# Patient Record
Sex: Male | Born: 1940 | Race: White | Hispanic: No | Marital: Married | Smoking: Never smoker
Health system: Southern US, Community
[De-identification: ages and names within clinical notes are randomized; demographics above are authoritative.]

## PROBLEM LIST (undated history)

## (undated) DIAGNOSIS — G4733 Obstructive sleep apnea (adult) (pediatric): Secondary | ICD-10-CM

## (undated) DIAGNOSIS — K5790 Diverticulosis of intestine, part unspecified, without perforation or abscess without bleeding: Secondary | ICD-10-CM

## (undated) DIAGNOSIS — E119 Type 2 diabetes mellitus without complications: Secondary | ICD-10-CM

## (undated) DIAGNOSIS — K219 Gastro-esophageal reflux disease without esophagitis: Secondary | ICD-10-CM

## (undated) DIAGNOSIS — E669 Obesity, unspecified: Secondary | ICD-10-CM

## (undated) DIAGNOSIS — F3289 Other specified depressive episodes: Secondary | ICD-10-CM

## (undated) DIAGNOSIS — I2510 Atherosclerotic heart disease of native coronary artery without angina pectoris: Secondary | ICD-10-CM

## (undated) DIAGNOSIS — N400 Enlarged prostate without lower urinary tract symptoms: Secondary | ICD-10-CM

## (undated) DIAGNOSIS — J9690 Respiratory failure, unspecified, unspecified whether with hypoxia or hypercapnia: Secondary | ICD-10-CM

## (undated) DIAGNOSIS — J449 Chronic obstructive pulmonary disease, unspecified: Secondary | ICD-10-CM

## (undated) DIAGNOSIS — J90 Pleural effusion, not elsewhere classified: Secondary | ICD-10-CM

## (undated) DIAGNOSIS — I10 Essential (primary) hypertension: Secondary | ICD-10-CM

## (undated) DIAGNOSIS — I509 Heart failure, unspecified: Secondary | ICD-10-CM

## (undated) DIAGNOSIS — Z9289 Personal history of other medical treatment: Secondary | ICD-10-CM

## (undated) DIAGNOSIS — I2781 Cor pulmonale (chronic): Secondary | ICD-10-CM

## (undated) DIAGNOSIS — I482 Chronic atrial fibrillation, unspecified: Secondary | ICD-10-CM

## (undated) DIAGNOSIS — I272 Pulmonary hypertension, unspecified: Secondary | ICD-10-CM

## (undated) DIAGNOSIS — F329 Major depressive disorder, single episode, unspecified: Secondary | ICD-10-CM

## (undated) HISTORY — DX: Chronic obstructive pulmonary disease, unspecified: J44.9

## (undated) HISTORY — DX: Obesity, unspecified: E66.9

## (undated) HISTORY — PX: TONSILLECTOMY: SUR1361

## (undated) HISTORY — DX: Essential (primary) hypertension: I10

## (undated) HISTORY — DX: Cor pulmonale (chronic): I27.81

## (undated) HISTORY — PX: TURP VAPORIZATION: SUR1397

## (undated) HISTORY — DX: Pulmonary hypertension, unspecified: I27.20

## (undated) HISTORY — DX: Diverticulosis of intestine, part unspecified, without perforation or abscess without bleeding: K57.90

## (undated) HISTORY — DX: Atherosclerotic heart disease of native coronary artery without angina pectoris: I25.10

## (undated) HISTORY — DX: Respiratory failure, unspecified, unspecified whether with hypoxia or hypercapnia: J96.90

## (undated) HISTORY — DX: Gastro-esophageal reflux disease without esophagitis: K21.9

## (undated) HISTORY — DX: Other specified depressive episodes: F32.89

## (undated) HISTORY — DX: Obstructive sleep apnea (adult) (pediatric): G47.33

## (undated) HISTORY — PX: INGUINAL HERNIA REPAIR: SUR1180

## (undated) HISTORY — PX: CHOLECYSTECTOMY: SHX55

## (undated) HISTORY — DX: Major depressive disorder, single episode, unspecified: F32.9

## (undated) HISTORY — DX: Type 2 diabetes mellitus without complications: E11.9

## (undated) HISTORY — DX: Personal history of other medical treatment: Z92.89

## (undated) HISTORY — DX: Benign prostatic hyperplasia without lower urinary tract symptoms: N40.0

## (undated) HISTORY — DX: Pleural effusion, not elsewhere classified: J90

---

## 2001-02-18 ENCOUNTER — Ambulatory Visit (HOSPITAL_COMMUNITY): Admission: RE | Admit: 2001-02-18 | Discharge: 2001-02-18 | Payer: Self-pay | Admitting: Neurosurgery

## 2006-06-04 ENCOUNTER — Ambulatory Visit (HOSPITAL_COMMUNITY): Admission: RE | Admit: 2006-06-04 | Discharge: 2006-06-04 | Payer: Self-pay | Admitting: *Deleted

## 2006-06-18 ENCOUNTER — Encounter (HOSPITAL_COMMUNITY): Admission: RE | Admit: 2006-06-18 | Discharge: 2006-07-18 | Payer: Self-pay | Admitting: Psychiatry

## 2006-06-18 DIAGNOSIS — Z9289 Personal history of other medical treatment: Secondary | ICD-10-CM

## 2006-06-18 HISTORY — DX: Personal history of other medical treatment: Z92.89

## 2006-06-22 ENCOUNTER — Ambulatory Visit (HOSPITAL_COMMUNITY): Admission: RE | Admit: 2006-06-22 | Discharge: 2006-06-22 | Payer: Self-pay | Admitting: *Deleted

## 2006-07-01 ENCOUNTER — Ambulatory Visit (HOSPITAL_COMMUNITY): Admission: RE | Admit: 2006-07-01 | Discharge: 2006-07-01 | Payer: Self-pay | Admitting: *Deleted

## 2006-08-06 ENCOUNTER — Ambulatory Visit: Payer: Self-pay | Admitting: Pulmonary Disease

## 2006-08-06 ENCOUNTER — Inpatient Hospital Stay (HOSPITAL_COMMUNITY): Admission: EM | Admit: 2006-08-06 | Discharge: 2006-08-30 | Payer: Self-pay | Admitting: Emergency Medicine

## 2006-08-25 HISTORY — PX: CARDIAC CATHETERIZATION: SHX172

## 2006-09-23 ENCOUNTER — Ambulatory Visit: Payer: Self-pay | Admitting: Pulmonary Disease

## 2006-10-04 ENCOUNTER — Ambulatory Visit: Payer: Self-pay | Admitting: Pulmonary Disease

## 2006-10-04 ENCOUNTER — Ambulatory Visit: Admission: RE | Admit: 2006-10-04 | Discharge: 2006-10-04 | Payer: Self-pay | Admitting: Pulmonary Disease

## 2006-10-23 ENCOUNTER — Ambulatory Visit: Payer: Self-pay | Admitting: Pulmonary Disease

## 2006-10-30 ENCOUNTER — Ambulatory Visit: Payer: Self-pay | Admitting: Internal Medicine

## 2006-10-30 ENCOUNTER — Inpatient Hospital Stay (HOSPITAL_COMMUNITY): Admission: RE | Admit: 2006-10-30 | Discharge: 2006-11-05 | Payer: Self-pay | Admitting: Urology

## 2006-10-30 ENCOUNTER — Encounter (INDEPENDENT_AMBULATORY_CARE_PROVIDER_SITE_OTHER): Payer: Self-pay | Admitting: Urology

## 2006-11-17 ENCOUNTER — Inpatient Hospital Stay (HOSPITAL_COMMUNITY): Admission: EM | Admit: 2006-11-17 | Discharge: 2006-11-24 | Payer: Self-pay | Admitting: Emergency Medicine

## 2006-11-17 ENCOUNTER — Encounter (INDEPENDENT_AMBULATORY_CARE_PROVIDER_SITE_OTHER): Payer: Self-pay | Admitting: General Surgery

## 2006-11-17 ENCOUNTER — Ambulatory Visit: Payer: Self-pay | Admitting: Internal Medicine

## 2007-01-21 ENCOUNTER — Ambulatory Visit (HOSPITAL_COMMUNITY): Admission: RE | Admit: 2007-01-21 | Discharge: 2007-01-21 | Payer: Self-pay | Admitting: Family Medicine

## 2007-01-27 ENCOUNTER — Ambulatory Visit (HOSPITAL_COMMUNITY): Admission: RE | Admit: 2007-01-27 | Discharge: 2007-01-27 | Payer: Self-pay | Admitting: Family Medicine

## 2007-03-06 ENCOUNTER — Inpatient Hospital Stay (HOSPITAL_COMMUNITY): Admission: EM | Admit: 2007-03-06 | Discharge: 2007-03-16 | Payer: Self-pay | Admitting: Emergency Medicine

## 2007-03-06 ENCOUNTER — Ambulatory Visit: Payer: Self-pay | Admitting: Emergency Medicine

## 2007-03-08 ENCOUNTER — Encounter: Payer: Self-pay | Admitting: Emergency Medicine

## 2007-03-08 HISTORY — PX: TRANSTHORACIC ECHOCARDIOGRAM: SHX275

## 2007-03-12 ENCOUNTER — Encounter: Payer: Self-pay | Admitting: Emergency Medicine

## 2007-03-15 ENCOUNTER — Ambulatory Visit: Payer: Self-pay | Admitting: Gastroenterology

## 2007-03-19 DIAGNOSIS — G4733 Obstructive sleep apnea (adult) (pediatric): Secondary | ICD-10-CM | POA: Insufficient documentation

## 2007-03-22 ENCOUNTER — Ambulatory Visit: Payer: Self-pay | Admitting: Pulmonary Disease

## 2007-03-22 DIAGNOSIS — I4891 Unspecified atrial fibrillation: Secondary | ICD-10-CM | POA: Insufficient documentation

## 2007-03-22 DIAGNOSIS — K219 Gastro-esophageal reflux disease without esophagitis: Secondary | ICD-10-CM | POA: Insufficient documentation

## 2007-03-22 DIAGNOSIS — M109 Gout, unspecified: Secondary | ICD-10-CM | POA: Insufficient documentation

## 2007-03-22 DIAGNOSIS — I2510 Atherosclerotic heart disease of native coronary artery without angina pectoris: Secondary | ICD-10-CM | POA: Insufficient documentation

## 2007-03-22 DIAGNOSIS — E119 Type 2 diabetes mellitus without complications: Secondary | ICD-10-CM | POA: Insufficient documentation

## 2007-03-22 DIAGNOSIS — I10 Essential (primary) hypertension: Secondary | ICD-10-CM | POA: Insufficient documentation

## 2007-03-22 LAB — CONVERTED CEMR LAB
BUN: 13 mg/dL (ref 6–23)
CO2: 33 meq/L — ABNORMAL HIGH (ref 19–32)
Calcium: 9.2 mg/dL (ref 8.4–10.5)
Chloride: 106 meq/L (ref 96–112)
Creatinine, Ser: 0.9 mg/dL (ref 0.4–1.5)
GFR calc Af Amer: 108 mL/min

## 2007-03-29 ENCOUNTER — Ambulatory Visit: Payer: Self-pay | Admitting: Pulmonary Disease

## 2007-03-29 DIAGNOSIS — R0902 Hypoxemia: Secondary | ICD-10-CM | POA: Insufficient documentation

## 2007-04-30 ENCOUNTER — Telehealth (INDEPENDENT_AMBULATORY_CARE_PROVIDER_SITE_OTHER): Payer: Self-pay | Admitting: *Deleted

## 2007-04-30 ENCOUNTER — Encounter: Payer: Self-pay | Admitting: Adult Health

## 2007-04-30 ENCOUNTER — Ambulatory Visit: Payer: Self-pay | Admitting: Internal Medicine

## 2007-05-09 ENCOUNTER — Inpatient Hospital Stay (HOSPITAL_COMMUNITY): Admission: EM | Admit: 2007-05-09 | Discharge: 2007-05-14 | Payer: Self-pay | Admitting: Emergency Medicine

## 2007-05-09 ENCOUNTER — Ambulatory Visit: Payer: Self-pay | Admitting: Critical Care Medicine

## 2007-05-14 ENCOUNTER — Encounter: Payer: Self-pay | Admitting: Pulmonary Disease

## 2007-05-18 ENCOUNTER — Ambulatory Visit: Payer: Self-pay | Admitting: Pulmonary Disease

## 2007-05-19 DIAGNOSIS — J9610 Chronic respiratory failure, unspecified whether with hypoxia or hypercapnia: Secondary | ICD-10-CM

## 2007-06-14 ENCOUNTER — Ambulatory Visit: Payer: Self-pay | Admitting: Pulmonary Disease

## 2007-06-14 DIAGNOSIS — I2789 Other specified pulmonary heart diseases: Secondary | ICD-10-CM

## 2007-06-14 DIAGNOSIS — J90 Pleural effusion, not elsewhere classified: Secondary | ICD-10-CM

## 2007-06-14 DIAGNOSIS — I5032 Chronic diastolic (congestive) heart failure: Secondary | ICD-10-CM

## 2007-06-14 DIAGNOSIS — J449 Chronic obstructive pulmonary disease, unspecified: Secondary | ICD-10-CM

## 2007-06-14 DIAGNOSIS — E662 Morbid (severe) obesity with alveolar hypoventilation: Secondary | ICD-10-CM

## 2007-06-14 DIAGNOSIS — J4489 Other specified chronic obstructive pulmonary disease: Secondary | ICD-10-CM | POA: Insufficient documentation

## 2007-06-14 HISTORY — DX: Pleural effusion, not elsewhere classified: J90

## 2007-07-01 ENCOUNTER — Inpatient Hospital Stay (HOSPITAL_COMMUNITY): Admission: EM | Admit: 2007-07-01 | Discharge: 2007-07-06 | Payer: Self-pay | Admitting: Emergency Medicine

## 2007-07-01 ENCOUNTER — Ambulatory Visit: Payer: Self-pay | Admitting: Internal Medicine

## 2007-07-06 ENCOUNTER — Telehealth (INDEPENDENT_AMBULATORY_CARE_PROVIDER_SITE_OTHER): Payer: Self-pay | Admitting: *Deleted

## 2007-07-14 ENCOUNTER — Ambulatory Visit: Payer: Self-pay | Admitting: Pulmonary Disease

## 2007-07-15 ENCOUNTER — Telehealth (INDEPENDENT_AMBULATORY_CARE_PROVIDER_SITE_OTHER): Payer: Self-pay | Admitting: *Deleted

## 2008-04-21 IMAGING — US US RENAL
1 series · 14 of 25 positions shown · non-contrast
Comparison: CT 06/22/2006. Plane film 08/07/2006.

CLINICAL DATA: Hematuria

RENAL/URINARY TRACT ULTRASOUND
TECHNIQUE: Complete ultrasound examination of the urinary tract was performed
including evaluation of the kidneys, renal collecting systems, and urinary
bladder.

[Series 1: unknown · 0.33mm/px · 14 of 35 slices shown]
[im 1/35]
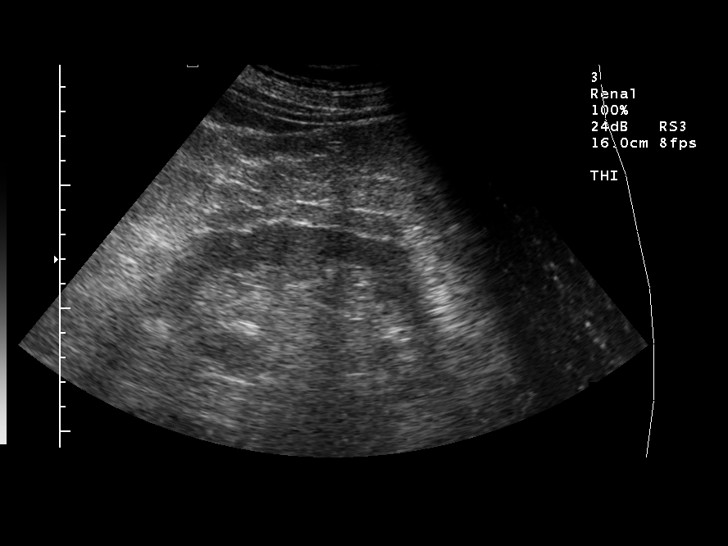
[im 3/35]
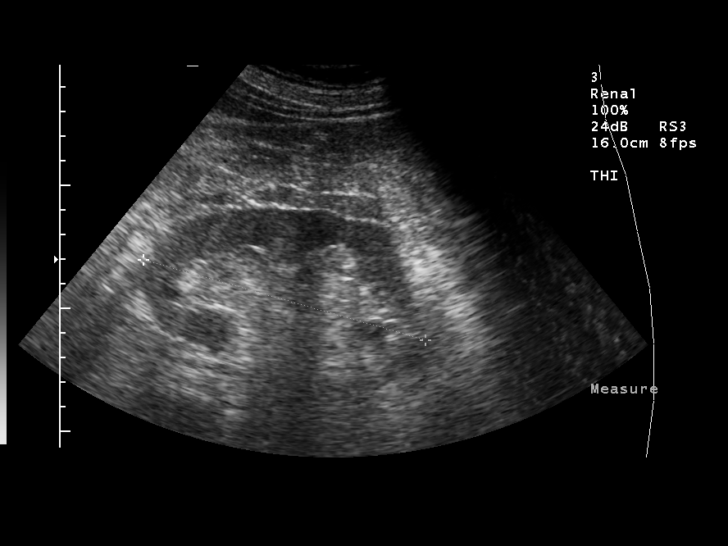
[im 6/35]
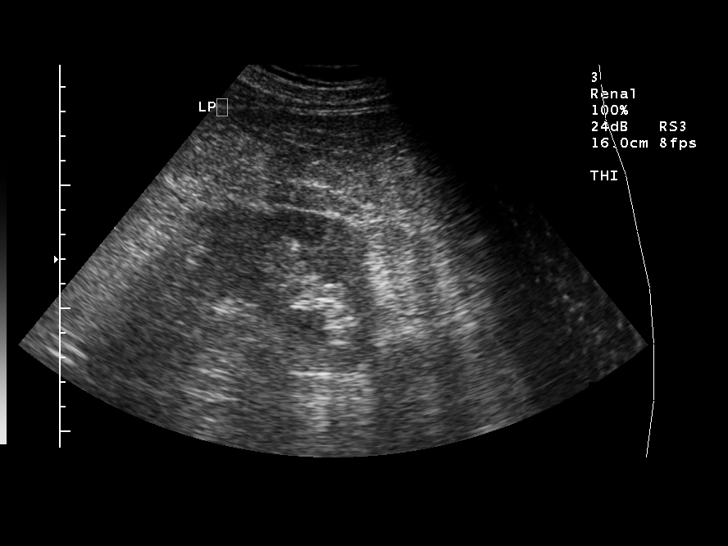
[im 9/35]
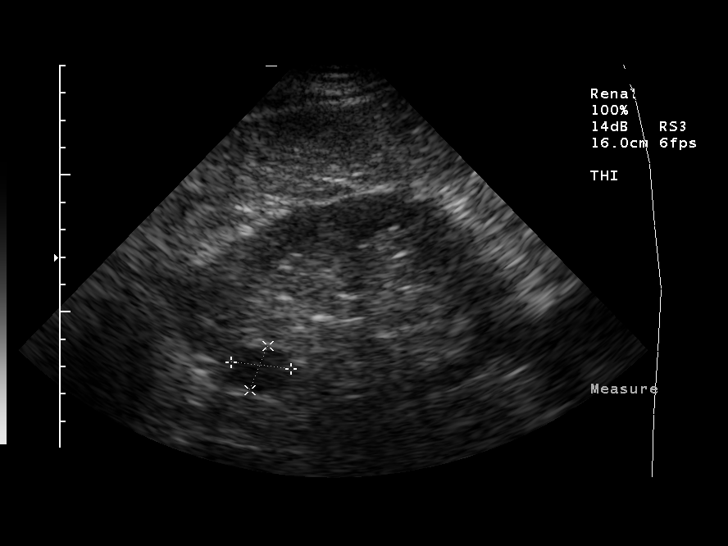
[im 12/35]
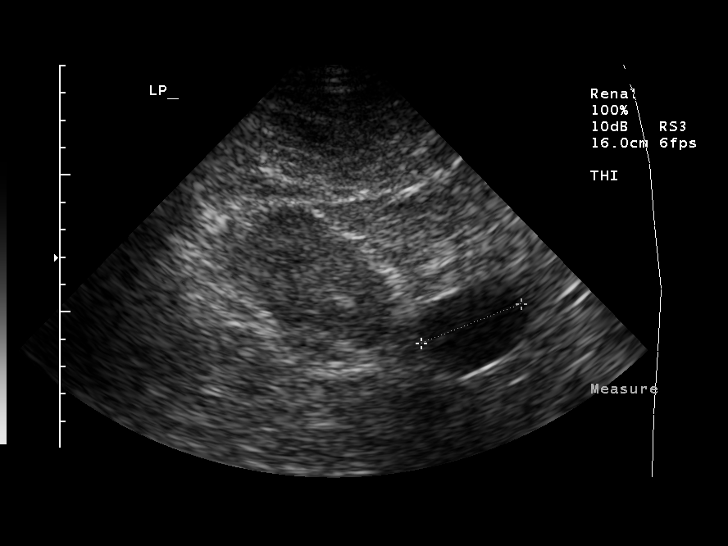
[im 13/35]
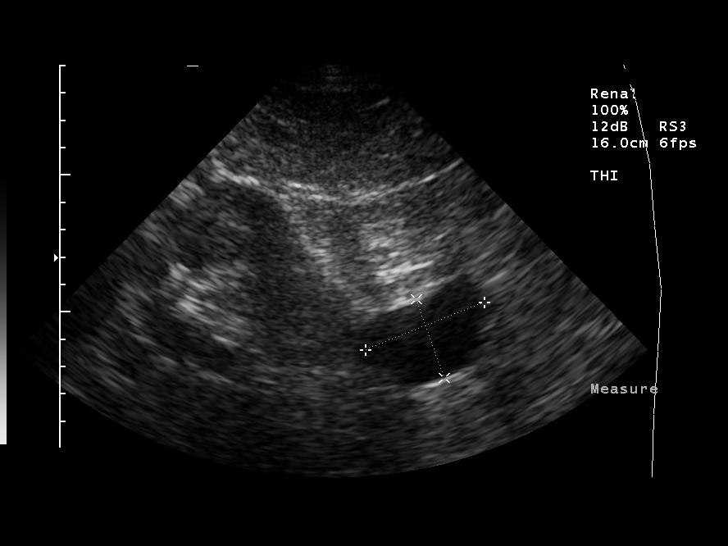
[im 16/35]
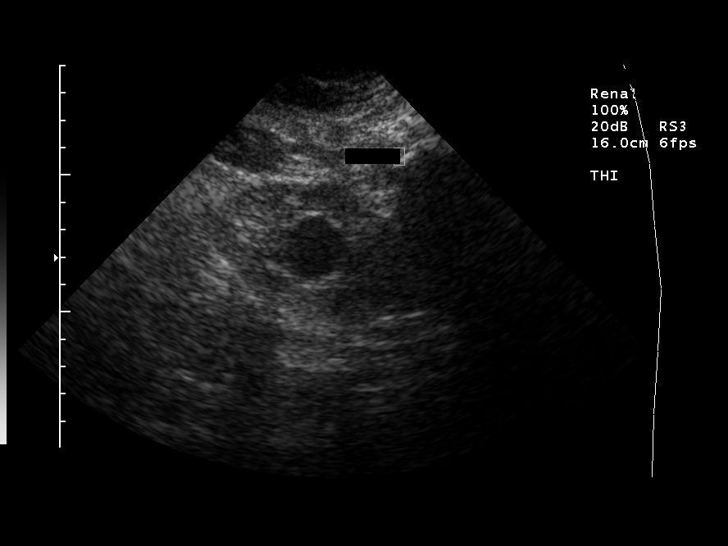
[im 19/35]
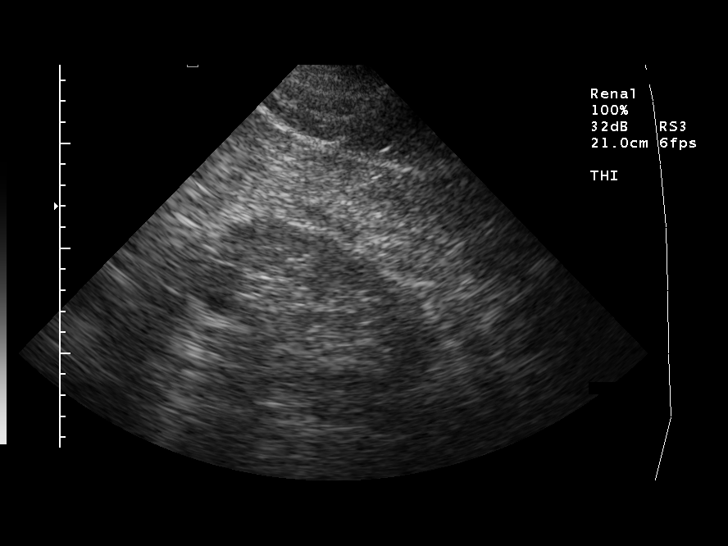
[im 22/35]
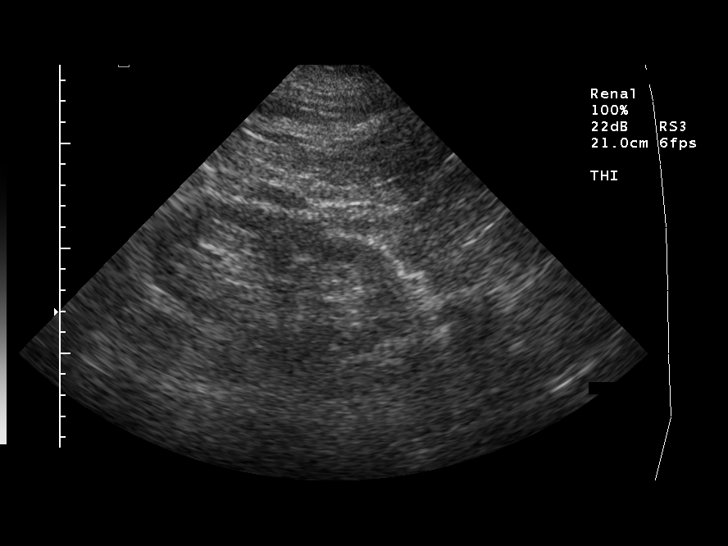
[im 23/35]
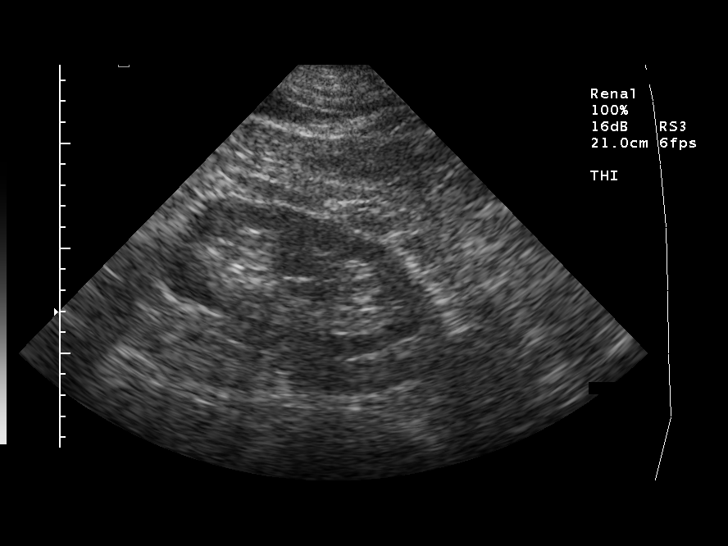
[im 26/35]
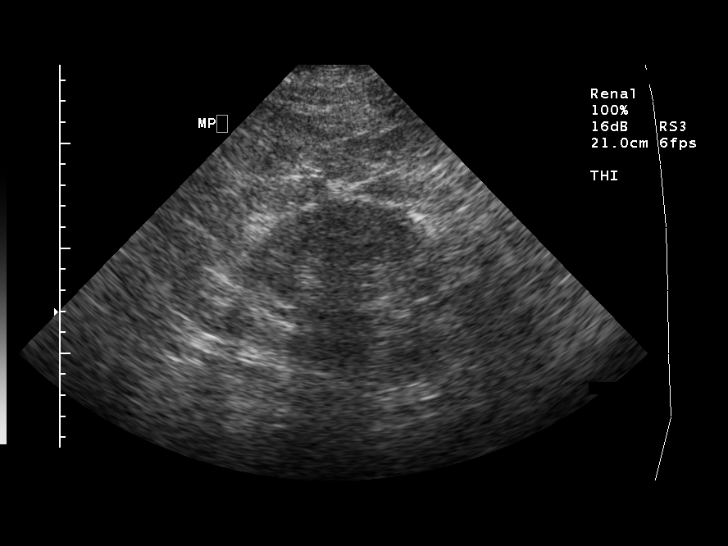
[im 29/35]
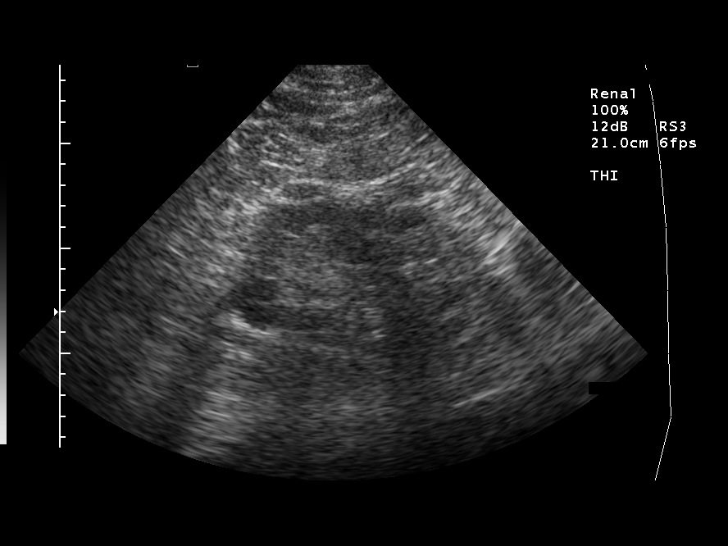
[im 32/35]
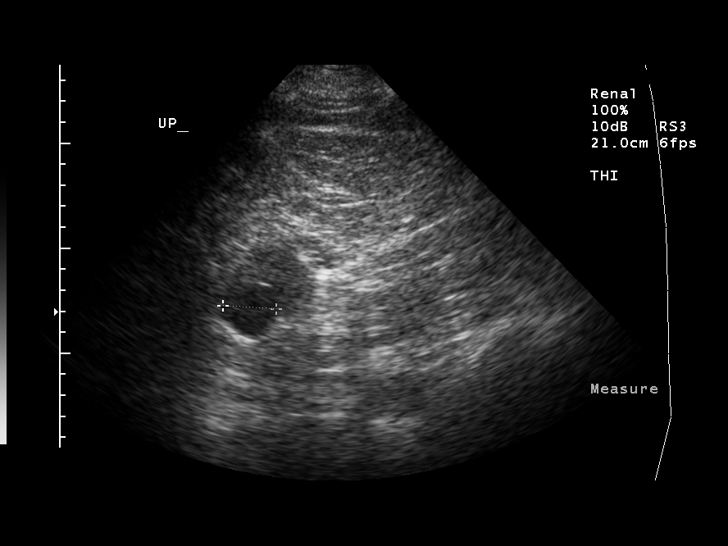
[im 35/35]
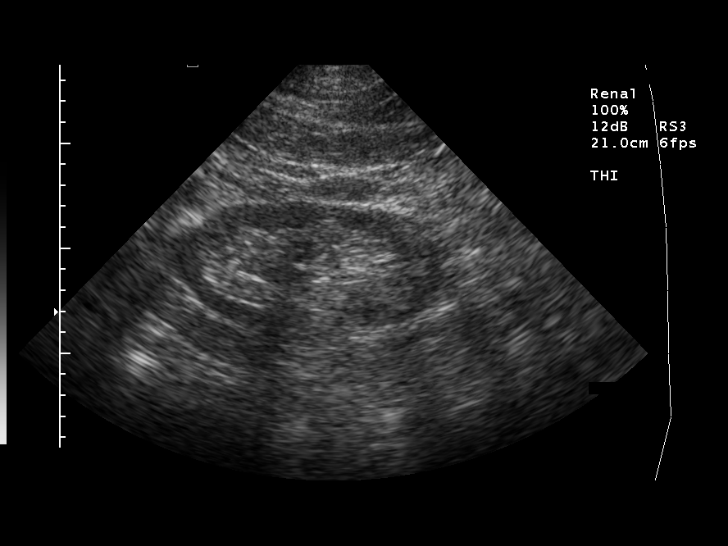

[14 of 25 positions shown; findings below may reference images not displayed]

FINDINGS: Right kidney 11.9 and left kidney 12.1 cm. No hydronephrosis.

Numerous cysts are identified within both kidneys. These measure up to 4.7 cm on
the right and 2.7 cm on the left. These are more entirely characterized on the
CT of 06/22/2006. No renal calculi.

Urinary bladder is collapsed around a Foley catheter.

IMPRESSION

1. No explanation for hematuria by ultrasound.
2.  Urinary bladder collapsed around Foley catheter.
3. Although ultrasound is a insufficient evaluation for hematuria, a prior CT of
06/22/2006 demonstrates no upper tract explanation for hematuria.

## 2008-04-29 IMAGING — CR DG CHEST 2V
2 series · 2 of 2 positions shown · non-contrast
Comparison: 08/17/06
 The opacities at the bases noted previously may be due to scarring with no evidence of infiltrate or effusion on the lateral view.

CLINICAL DATA: Shortness of breath.  Hypoxia.
 [id number]-0 VIEWS:

[w chest pa]
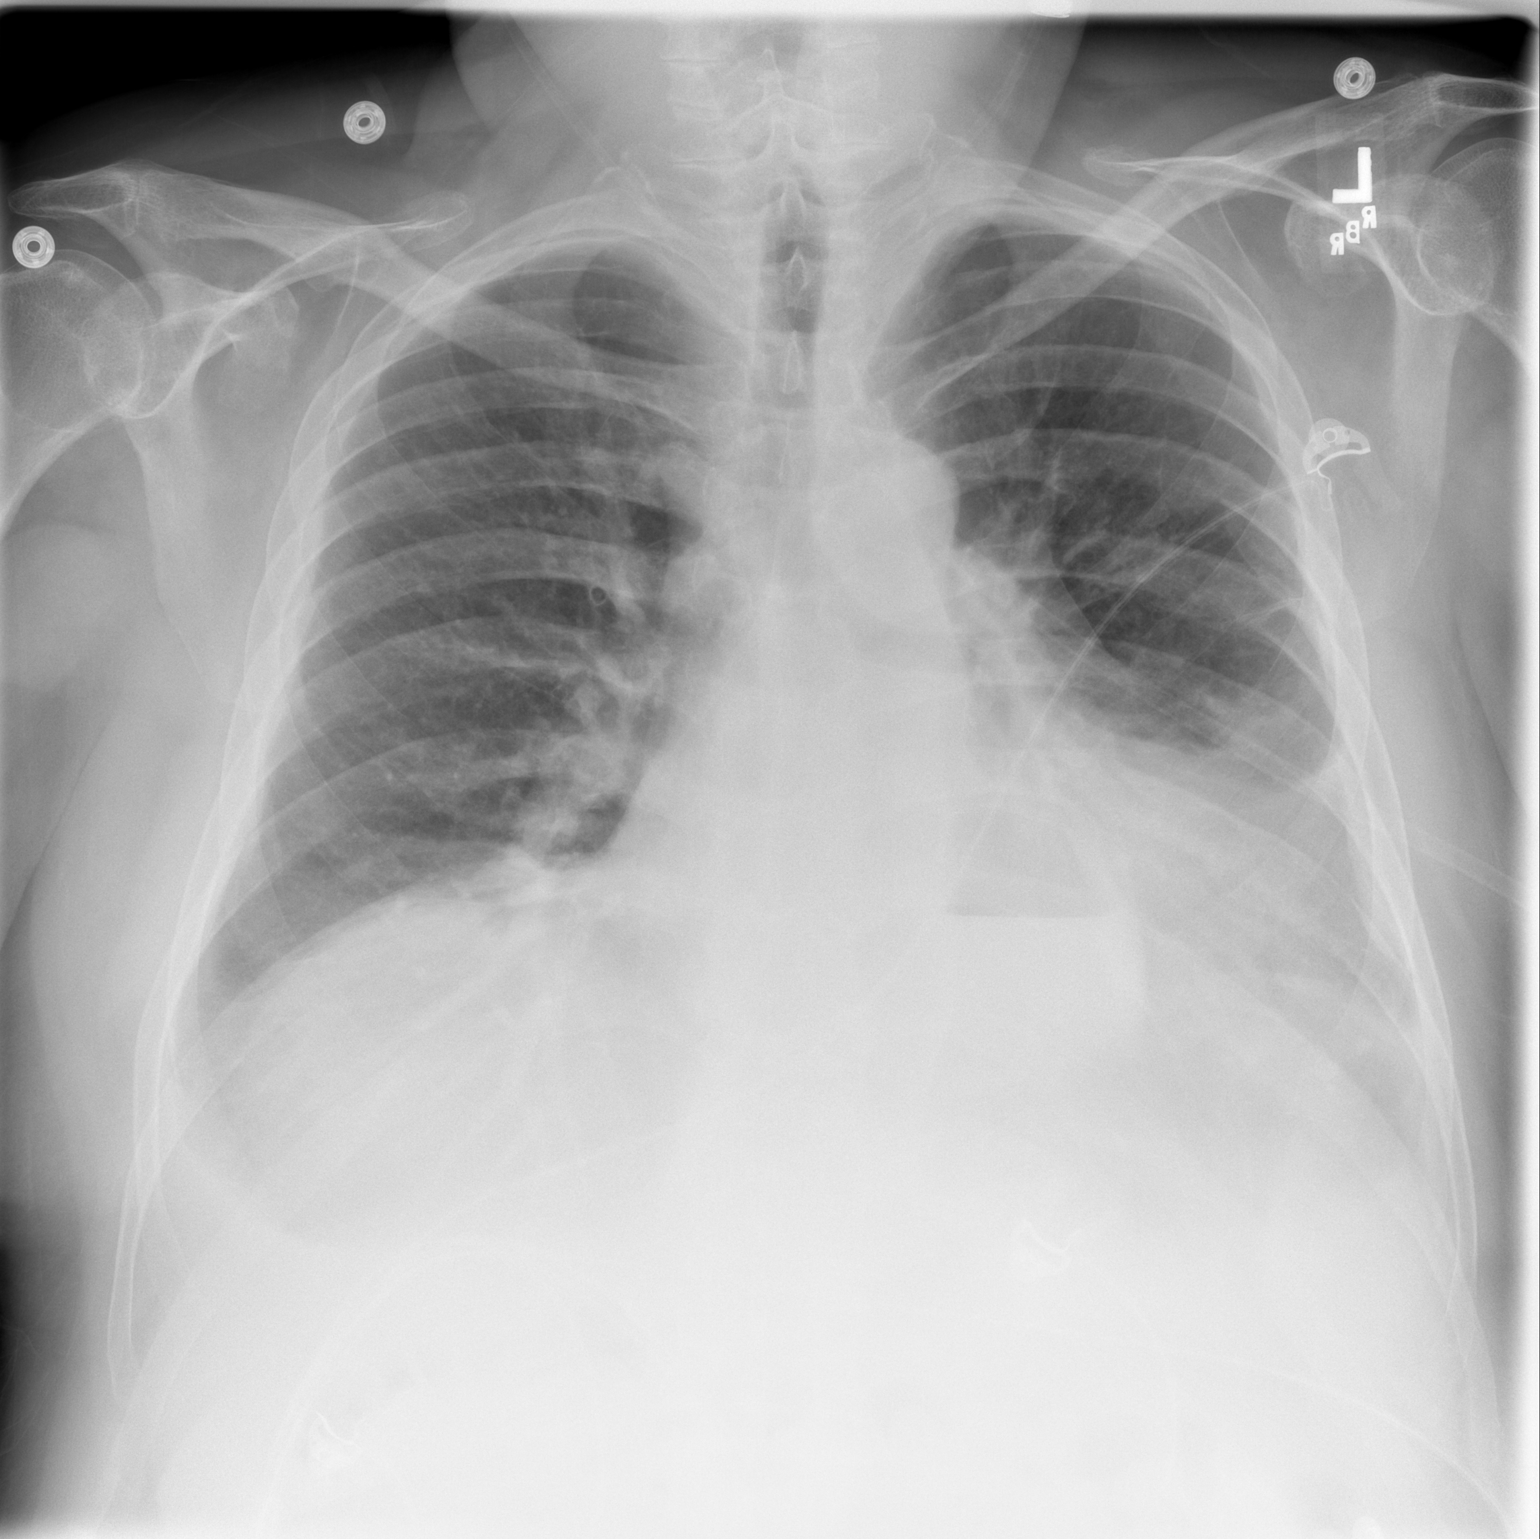

[w chest lat]
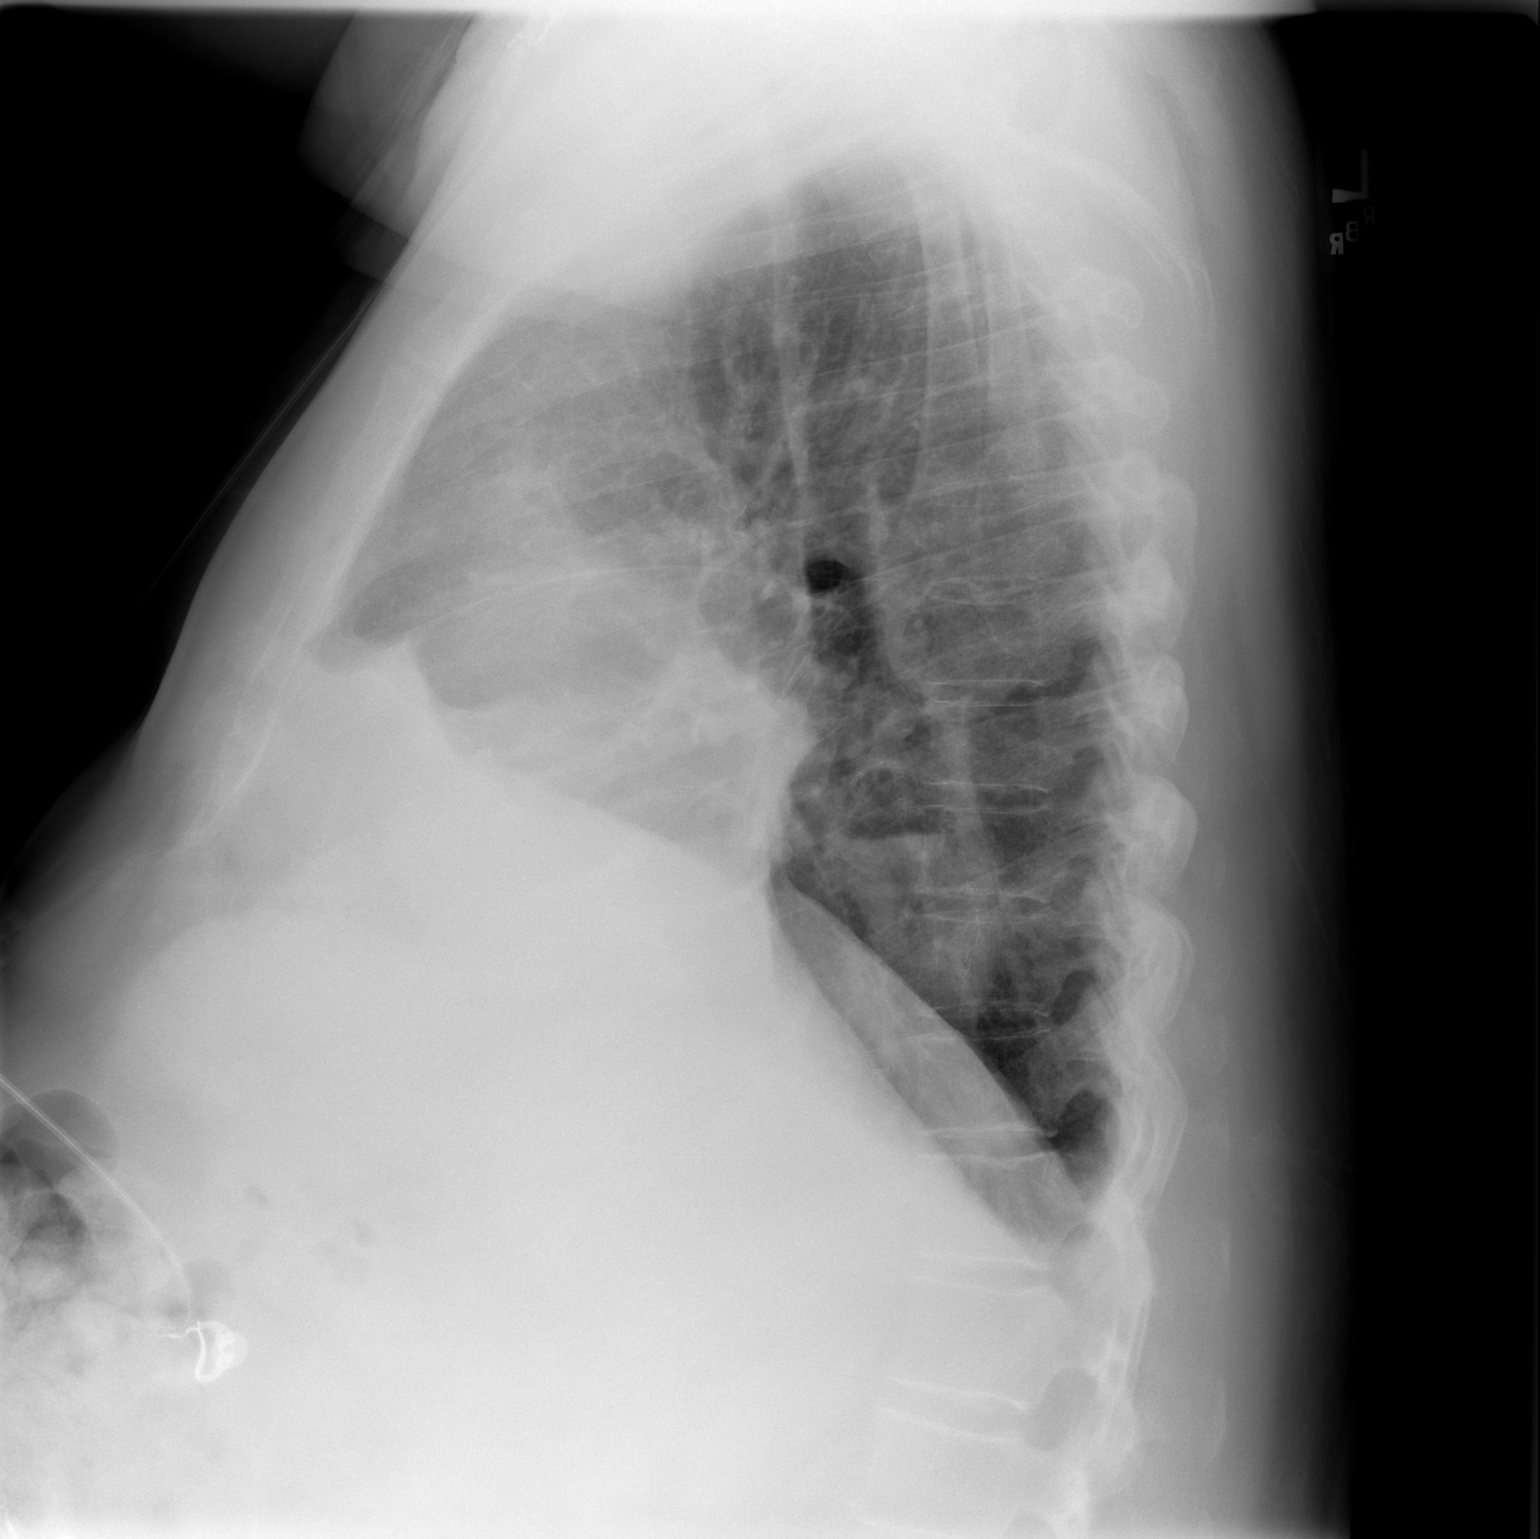

[2 of 2 positions shown; findings below may reference images not displayed]

There is a moderate-sized hiatal hernia present and the heart is within normal limits and size.  No bony abnormality is seen.
IMPRESSION: Moderate-sized hiatal hernia.  No definite infiltrate or effusion.

## 2008-06-29 ENCOUNTER — Ambulatory Visit: Payer: Self-pay | Admitting: Pulmonary Disease

## 2008-09-26 ENCOUNTER — Ambulatory Visit: Payer: Self-pay | Admitting: Pulmonary Disease

## 2008-10-24 ENCOUNTER — Telehealth (INDEPENDENT_AMBULATORY_CARE_PROVIDER_SITE_OTHER): Payer: Self-pay | Admitting: *Deleted

## 2008-10-24 ENCOUNTER — Telehealth: Payer: Self-pay | Admitting: Pulmonary Disease

## 2008-10-25 ENCOUNTER — Encounter: Payer: Self-pay | Admitting: Pulmonary Disease

## 2008-11-05 IMAGING — CR DG CHEST 1V PORT
1 series · 1 of 1 positions shown · non-contrast
Comparison: Portable chest x-ray yesterday.  CT chest of 01/27/07.

CLINICAL DATA: Followup edema and bibasilar atelectasis versus pneumonia.
PORTABLE CHEST ? 1 VIEW ? 03/07/07 ? 4100 HOURS:

[view not recorded]
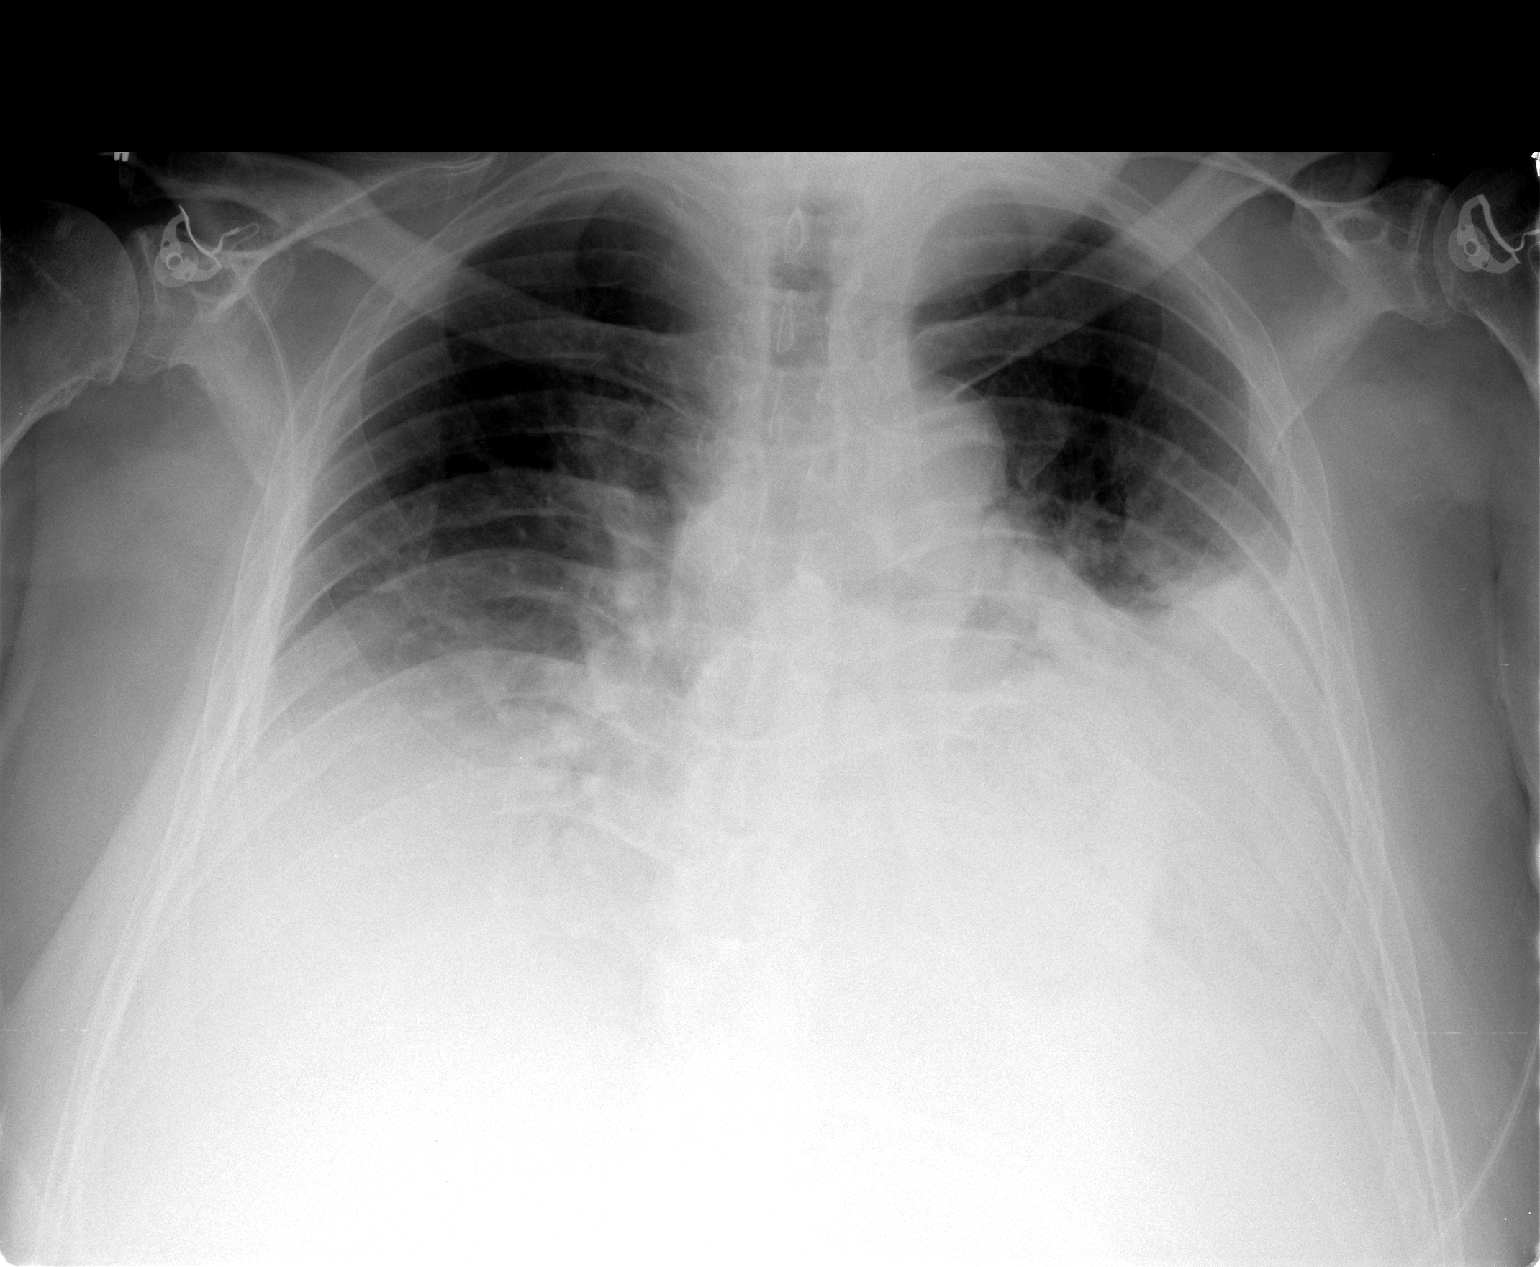

[1 of 1 positions shown; findings below may reference images not displayed]

FINDINGS: Markedly suboptimal inspiration with dense consolidation in the lower lobes, unchanged.  Pulmonary venous hypertension without overt edema at this time.  Improved since yesterday.  No new abnormalities.  eart enlarged but stable.
IMPRESSION: Improved pulmonary edema.  Stable dense bilateral lower lobe atelectasis versus pneumonia.  No new abnormalities.

## 2008-12-12 ENCOUNTER — Ambulatory Visit: Payer: Self-pay | Admitting: Pulmonary Disease

## 2008-12-12 ENCOUNTER — Telehealth (INDEPENDENT_AMBULATORY_CARE_PROVIDER_SITE_OTHER): Payer: Self-pay | Admitting: *Deleted

## 2008-12-14 ENCOUNTER — Telehealth: Payer: Self-pay | Admitting: Pulmonary Disease

## 2008-12-15 ENCOUNTER — Encounter: Payer: Self-pay | Admitting: Pulmonary Disease

## 2009-03-01 IMAGING — CR DG CHEST 2V
2 series · 2 of 2 positions shown · non-contrast
Comparison: 06/14/2007 and earlier.

CLINICAL DATA: 67-year-old male with shortness of breath.

CHEST - 2 VIEW

[w chest pa]
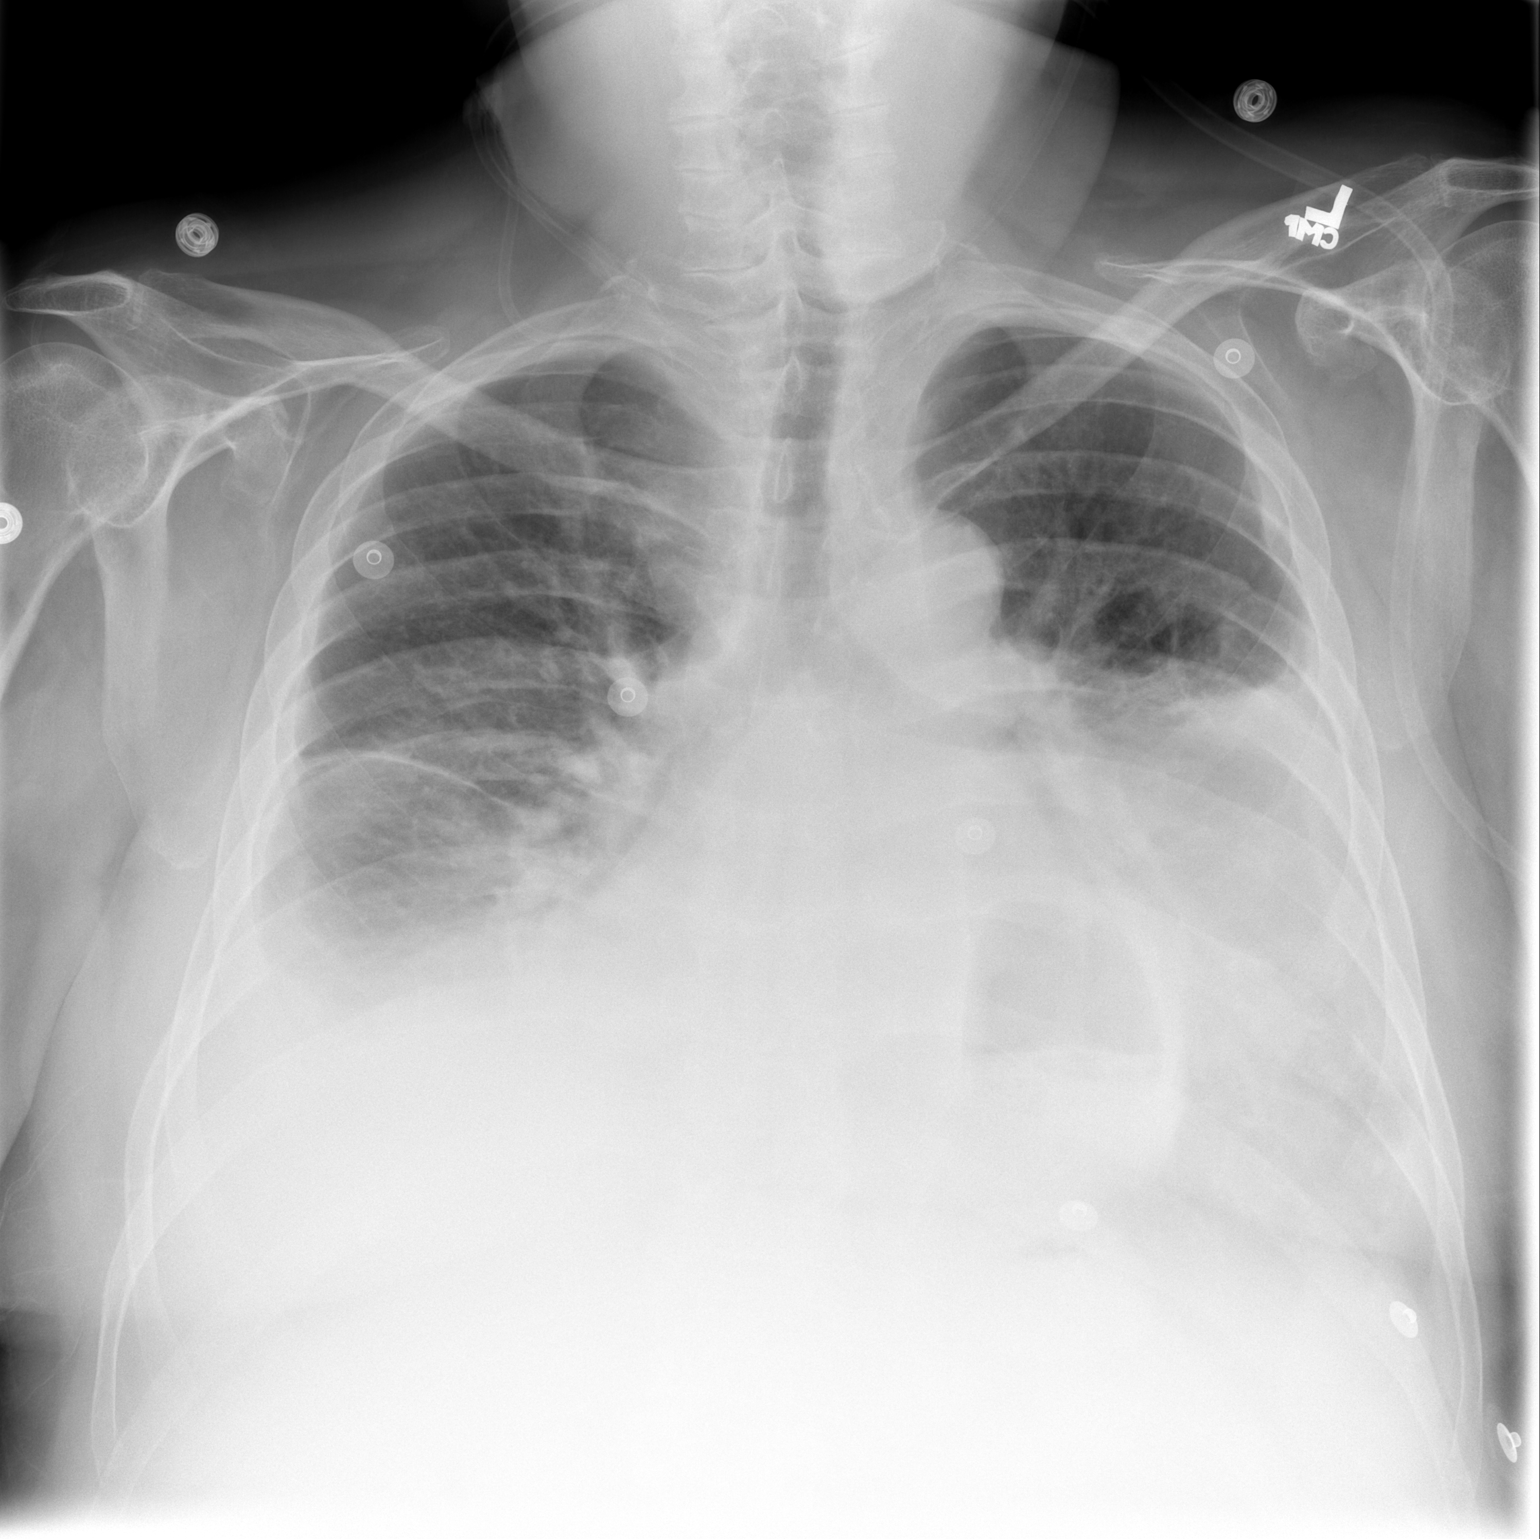

[w chest lat]
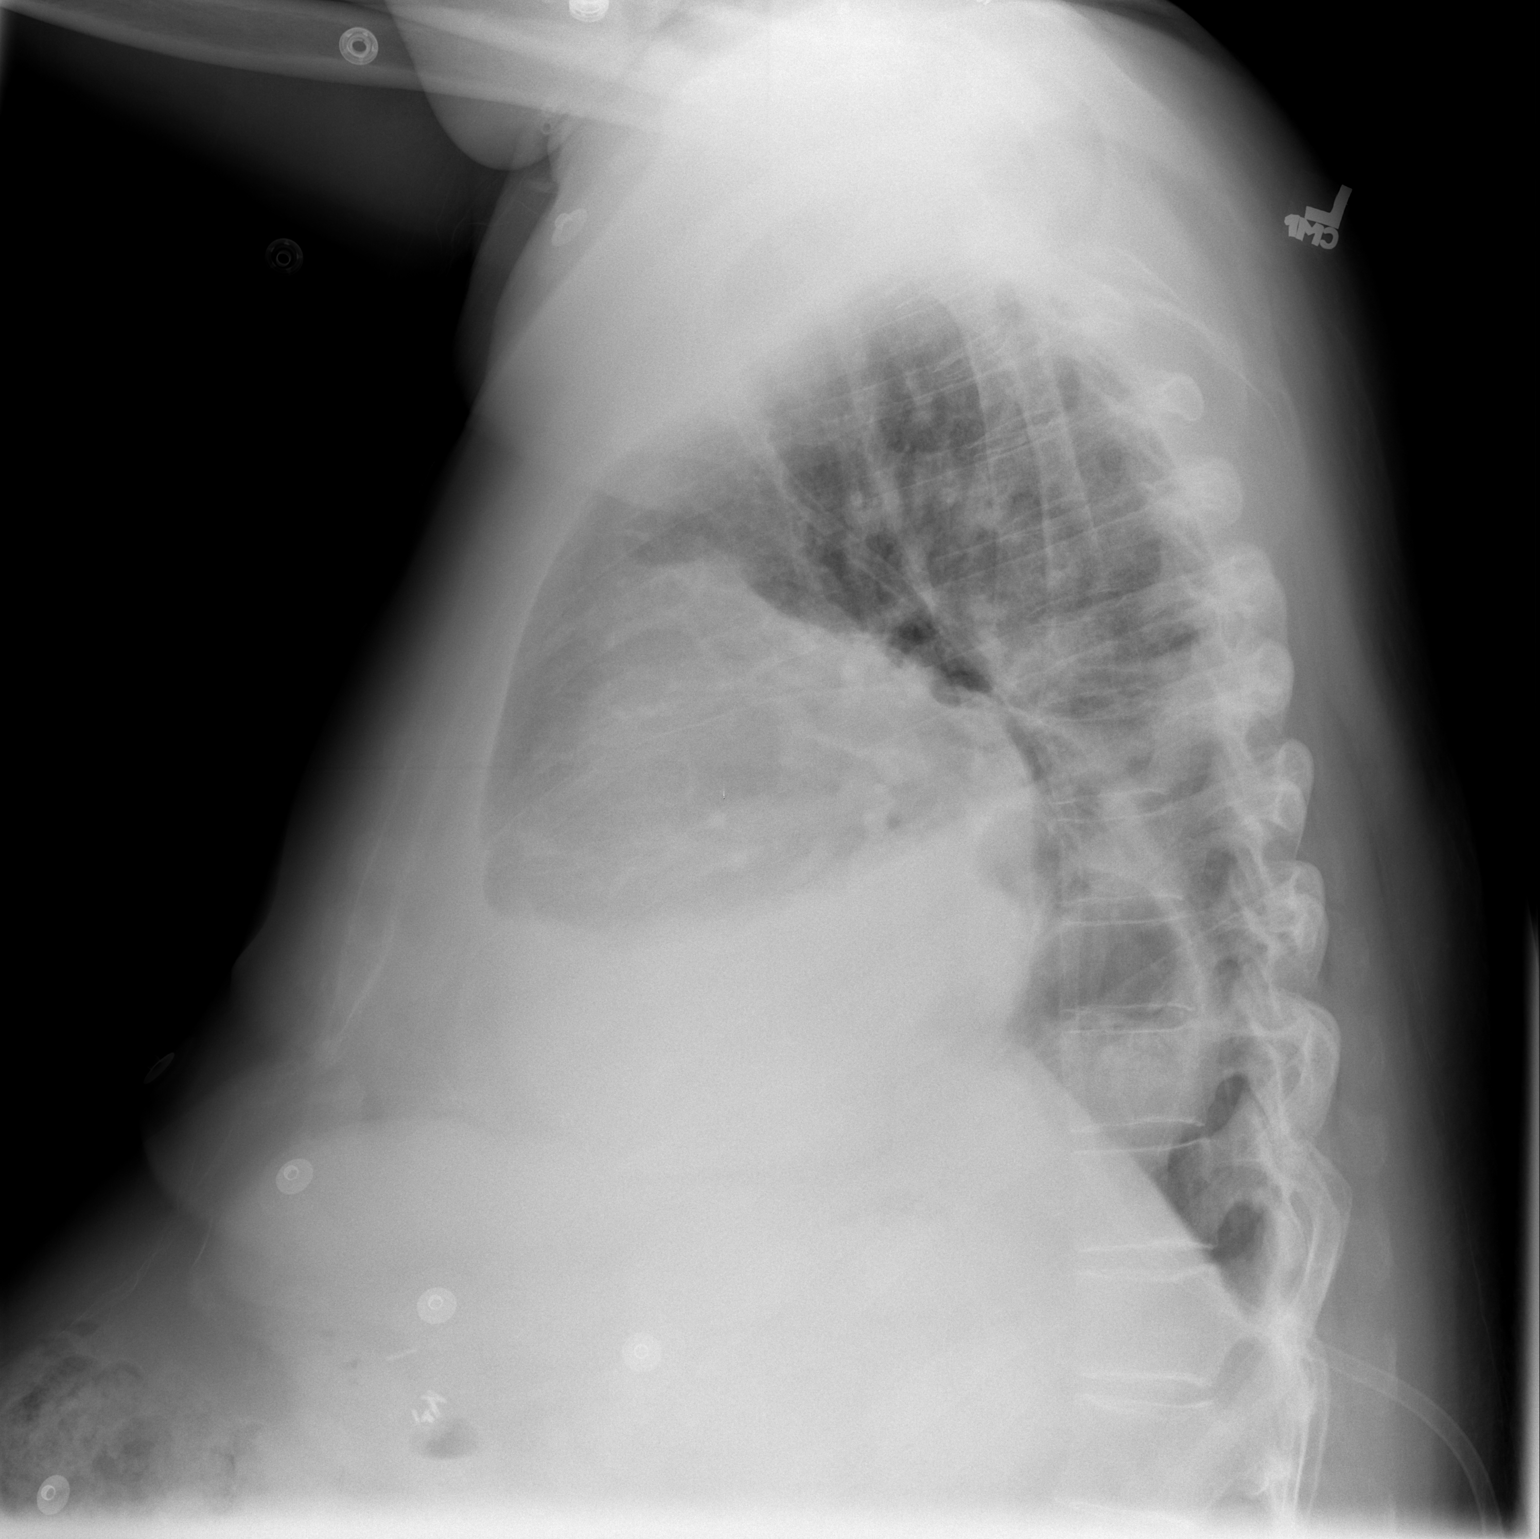

[2 of 2 positions shown; findings below may reference images not displayed]

FINDINGS: Paraesophageal hernia re-identified with retrocardiac air
fluid level and associated left basilar atelectasis.  Prominent
mediastinal fat also demonstrated on the prior CT of 05/10/2007,
overall the appearance of the left lung bases unchanged.  There is
is a small right pleural effusion and atelectasis at the right lung
base which is stable to mildly increased since the prior exam.  No
pneumothorax or pulmonary edema.  Surgical clips in the abdomen.
Stable visualized osseous structures.
IMPRESSION: 1.  Stable mildly increased small right pleural effusion and right
basilar atelectasis.
2.  Unchanged appearance of the left base which corresponds to a
moderate - large paraesophageal hernia, left lower lobe volume loss
and mediastinal lipomatosis.

## 2009-03-12 ENCOUNTER — Telehealth: Payer: Self-pay | Admitting: Pulmonary Disease

## 2009-03-12 ENCOUNTER — Encounter: Payer: Self-pay | Admitting: Pulmonary Disease

## 2010-02-14 NOTE — Progress Notes (Signed)
Summary: prescription  Phone Note From Pharmacy Call back at [phone]   Caller: [facility]//sheila tucker Call For: Melvern Ramone  Summary of Call: Needs an updated rx for cpap supplies.  They also sent fax. Initial call taken by: Darletta Moll,  March 12, 2009 2:23 PM  Follow-up for Phone Call        order palced. sent to [facility]. Carron Curie CMA  March 12, 2009 3:38 PM

## 2010-02-14 NOTE — Medication Information (Signed)
Summary: Request updated Rx for CPAP Supplies/[facility]  Request updated Rx for CPAP Supplies/[facility]   Imported By: Sherian Rein 04/05/2009 11:42:59  _____________________________________________________________________  External Attachment:    Type:   Image     Comment:   External Document

## 2010-05-28 NOTE — H&P (Signed)
NAME:  [name] [name] NO.:  [id number]   MEDICAL RECORD NO.:  [id number]          PATIENT TYPE:  EMS   LOCATION:  MAJO                         FACILITY:  [facility]   PHYSICIAN:  Kalman Shan, MD   DATE OF BIRTH:  11-19-1940   DATE OF ADMISSION:  07/01/2007  DATE OF DISCHARGE:                              HISTORY & PHYSICAL   TIME OF EVALUATION:  4 p.m. to 5 p.m., July 01, 2007.   CHIEF COMPLAINT:  Worsening shortness of breath.   HISTORY OF PRESENT ILLNESS:  [name] [name] is a morbidly obese 70-year-  old male who has a remote smoking history but has multiple medical  problems including morbid obesity, sleep apnea, diastolic dysfunction,  chronic left lower lobe atelectasis, hiatal hernia, right pleural  effusion (status post thoracentesis February 2009 revealed transudates).  He is followed by Dr. Coralyn Helling in the pulmonary office at [facility].   He presents with a 1-week history of shortness of breath.  At baseline  he claims that he can walk 50 yards nonstop with the help of a walking  stick but now he has to stop 3 times before he can cover those 50 yards.  Other than this, he denies any other complaints.  Denies chest pain,  edema, worsening cough, orthopnea, paroxysmal nocturnal dyspnea, edema,  fever, hemoptysis.   PAST MEDICAL HISTORY:  1. Morbid obesity.  2. Chronic hypoxemia.  3. Diastolic dysfunction.  4. Obstructive sleep apnea and obesity hypoventilation syndrome for      which he is on CPAP at night.  5. Gout.  6. Acid reflux disease.  7. Hiatal hernia seen on CT scans of the chest  8. Hypertension.  9. Coronary artery disease not otherwise specified.  10.Atrial fibrillation that is chronic.  11.Mild pulmonary hypertension.  12.Cardiac catheterization in August of 2008 showed PA pressure is      41/16, and a mean pressure of 30.  13.Obstructive sleep apnea with BiPAP of 12/8 with 4 liters oxygen at      night.  14.Left lower lobe  atelectais - chronic since avacilabe scans dating      back to 2008  15.Left diagphragm elevation mentioned in charts - I personally doubt      this diagnosis bsed on review of coronal images of ct chest and      abdomen where left diaphragm appears lower than right   ALLERGIES:  HE IS ALLERGIC TO KEFLEX, SEPTRA AND CECLOR.   PAST SURGICAL HISTORY:  1. Status post cholecystectomy on an emergent basis for gangrenous      cholecystitis in November of 2008.  2. 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  3. History of nasal surgery.  4. History of prostate TURP in October of 2008.  5. Urethroscopy and cystogram after transurethral Foley catheter      placement in October of 2008.   SOCIAL HISTORY:  He is retired, worked at [employer].  Remote tobacco use himself but denies for the past many years.  Does not  use alcohol.   FAMILY HISTORY:  Father had prostate cancer and coronary artery disease.  Stroke and diabetes runs in the family.   CODE STATUS:  He is full code but he told me that once intubated and  once has CPR, if condition is considered irreversible, to stop it.  He  does not want to be on the ventilator past a few days.   CURRENT MEDICATIONS:  list of medications include:  1. Zoloft 100 mg once daily.  2. Nexium.  3. Glimepiride.  4. Coumadin.  5. Avodart.  6. Zocor.  7. Allopurinol.  8. Cardizem.  9. Metoprolol.  10.Lasix.  11.Klor-Con.  12.Avapro.  13.Oxygen.  14.Lantus.  15.Nasacort.  16.Mucinex.  17.Xopenex.  18.Spiriva HandiHaler.   PHYSICAL EXAM:  VITAL SIGNS:  Weight is 253.38 pounds.  Saturation 94%  on 2 liters.  Afebrile.  Blood pressure is 120/72.  Pulse of 85.  GENERAL EXAM:  An obese male lying on the left side in the bed.  CENTRAL NERVOUS SYSTEM:  Speech intact.  Alert and oriented x3.  Glasgow  coma scale 15.  RASS sedation score 0.  Moves all 3 extremities,  oriented x3.  HEENT:   Obese.  Fat, short neck.  Mallampati class IV.  Poor dentition.  NECK:  No neck nodes.  CARDIOVASCULAR:  Normal heart sounds.  Irregular rate and rhythm.  PULMONARY:  Left lower lobe crackles present.  Right side, diminished  air entry.  ABDOMEN:  Obese, soft, nontender.  EXTREMITIES:  No cyanosis, no clubbing, no edema.  SKIN:  Skin is intact.  MUSCULOSKELETAL:  No joint deformities.   LABORATORY VALUES:  1. Chest x-ray on July 02, 2007 shows left basilar opacification.      This is chronic and unchanged and extends all the way to 2008.      This is believed to be due to left lower lobe atelectasis.  This is      also seen on CT scans in November 2008, February 2009 and most      recently on May 10, 2007.  There is also mention in the E chart      that he has left hemidiaphragm elevation following esophageal      surgery but when I looked at the coronal planes of the CT scans of      the chest, I do not see any left diaphragm elevation, therefore, I      believe that this left-sided opacification is due to left lower      lobe atelectasis and his left hiatal hernia.   1. Chest x-ray today July 02, 2007 shows a small right pleural      effusion.  This was absent on chest x-rays June 14, 2007 and May 12, 2007 and March 22, 2007 but present on chest x-ray on March 06, 2007.  Therefore, it appears that his right pleural effusion      has recurred.   1. Arterial blood gas.  This is pending at this stage.   1. CBC:  White count is elevated at 19.2 thousand with 84% polys.      This is a new elevation in white count for him.  Baseline white      count runs around 11-14,000.  Hemoglobin 13.4, platelet count is      234.  MCV is 90.   1. Cardiac profile:  Cardiac enzymes, troponin point of care less than  0.05.  Beta natriuretic peptide is 186.  This is less than usual.  2. Comprehensive metabolic panel:  Creatinine is normal at 0.76.  3. PT/PTT.  This is currently  unavailable.  4. Serial radiologic evaluation.  Evaluation of CT scans of the chest      in November 2008, February 2009, and May 10, 2007 shows left      lower lobe atelectasis, hiatal hernia in coronal sections but these      do not show left diaphragm elevation.  The left lower lobe      atelectasis is essentially unchanged.   ASSESSMENT AND PLAN:  1. Mr. Hoque is a morbidly obese male with mild pulmonary hypertension      secondary to obstructive sleep apnea and obesity hypoventilation      syndrome.  He has had transudative effusion on March 12, 2007.      This seemed to have cleared other than a mild effusion seen on CT      scan of the chest on May 10, 2007 which was not appreciable on      the chest x-ray around the same time on May 12, 2007.  However,      today it seems to have recurred and he presents with a 1-week      history of shortness of breath.  I suspect this right pleural      effusion is what is making him more short of breath.   1. I do not know the etiology of the pleural effusion.  In the past it      was transudative.  However, this time the BNP is 189.  There are no      other symptoms of heart failure.  On the other hand, his white      count is elevated at 19,000. Wonder if effusion is exudative   PLAN:  1. Admit to the hospital.  2. Admit to step-down intensive care unit.  3. Immediate BiPAP depending on blood gas results.  4. Thoracentesis is indicated to assess the etiology of the effusion      and to give therapeutic relief as this seems to be the most obvious      etiology for the shortness of breath.  However, first need to check      coagulation profile and stop his Coumadin in order to do a safe      thoracentesis.   He has agreed to the admission.  Will therefore admit to the step-down  unit.   This case was discussed with Dr. Coralyn Helling.      Kalman Shan, MD  Electronically Signed     MR/MEDQ  D:  07/01/2007  T:   07/01/2007  Job:  [id number]   cc:   Rebecca Eaton  Coralyn Helling, MD

## 2010-05-28 NOTE — Discharge Summary (Signed)
NAME:  [name] [name], [name] [name] NO.:  [id number]   MEDICAL RECORD NO.:  [id number]          PATIENT TYPE:  INP   LOCATION:  1524                         FACILITY:  [facility]   PHYSICIAN:  Wilson Singer, M.D.DATE OF BIRTH:  06/05/1940   DATE OF ADMISSION:  11/17/2006  DATE OF DISCHARGE:  11/24/2006                               DISCHARGE SUMMARY   FINAL DISCHARGE DIAGNOSES:  1. Open cholecystectomy status post gangrenous cholecystitis.  2. Sleep apnea.  3. Diabetes mellitus.  4. Chronic atrial fibrillation, stable.  5. Status post recent TURP.   CONDITION ON DISCHARGE:  Stable.   MEDICATIONS ON DISCHARGE:  1. Lantus insulin 15 units q.h.s.  2. Avodart 0.5 mg daily.  3. Allopurinol 300 mg daily.  4. Zoloft 50 mg daily.  5. Tiazac 240 mg daily.  6. Zocor 20 mg daily.  7. Flomax 0.4 mg daily.  8. Nexium 40 mg daily.  9. Amaryl 2 mg daily.  10.Metoprolol 25 mg b.i.d.   HISTORY:  This very pleasant 70 year old obese gentleman was admitted  with a diagnosis of cholecystitis when he presented with abdominal pain  which was localized in the right upper quadrant.  Please see initial  history and physical examination by Dr. Michaelyn Barter.   HOSPITAL PROGRESS:  The patient was admitted and seen by Dr. [name],  surgeon, who felt that he required surgery.  He was taken to the  operating room on November 17, 2006, and had an open cholecystectomy.  The gallbladder was gangrenous.  He required intubation and ventilation  postoperatively but was fairly quick to get off the ventilator after  being on BiPAP for a couple of days.  He has oxygen at home, and he has  sleep apnea.  Since the last couple of days, he has done well and is  tolerating a diet.  His drain was taken out yesterday by Dr. Ovidio Kin.  His Foley catheter has been removed yesterday also, and he is  somewhat intermittently passing urine.  We are awaiting the urologist to  review the state of the Foley  catheter, whether he needs to have it in  and go home with this or whether he can have it out and follow up with  the urologist.   PHYSICAL EXAMINATION:  Temperature 96.9, blood pressure 115/86, pulse 60  in atrial fibrillation, 96% saturation on 2 L oxygen.  Lung fields are clear except for some reduced air entry in the bases,  and he does have some atelectasis which should improve as he becomes  more mobile.   Investigations today show a sodium of 141, potassium 3.6, bicarbonate  32, BUN 8, creatinine 0.75.   FURTHER DISPOSITION:  I have stopped/discontinued his Diovan, and he  will take Tiazac and metoprolol which seems to be controlling his blood  pressure and ventricular rate for his atrial fibrillation.  He needs to  follow up with Dr. [name] in about 10 days to remove staples, and he  will follow up with the urologist also.  I have asked him to follow up  with his primary care physician  in the next 2 weeks also.      Wilson Singer, M.D.  Electronically Signed     NCG/MEDQ  D:  11/24/2006  T:  11/24/2006  Job:  [id number]

## 2010-05-28 NOTE — Op Note (Signed)
NAME:  [name] [name], [name] [name] NO.:  [id number]   MEDICAL RECORD NO.:  [id number]          PATIENT TYPE:  INP   LOCATION:  1223                         FACILITY:  [facility]   PHYSICIAN:  Sigmund I. Patsi Sears, M.D.DATE OF BIRTH:  May 02, 1940   DATE OF PROCEDURE:  11/01/2006  DATE OF DISCHARGE:                               OPERATIVE REPORT   PREOPERATIVE DIAGNOSIS:  Urethral perforation with intra-abdominal  catheter placement.   POSTOPERATIVE DIAGNOSIS:  Urethral perforation with intra-abdominal  catheter placement.   OPERATIONS:  Cystourethroscopy, cystogram, transurethral Foley catheter  placement.   SURGEON:  Dr. Patsi Sears.   ANESTHESIA:  General endotracheal.   PREPARATION:  After appropriate preanesthesia, the patient is brought to  the operating room, placed on the operating table in dorsal supine  position where general endotracheal anesthesia was induced.  He was then  replaced in dorsal lithotomy position where pubis was prepped with  Betadine solution and draped in usual fashion.   REVIEW OF HISTORY:  This 70 year old male is status post TURP on Friday,  with poor urinary drainage on Saturday.  On Sunday morning, the  patient's Foley catheter was manipulated, and 900 mL was obtained.  However, the patient did not void the entire rest the day.  The patient  began to have decrease in his sensorium, serum creatinine was obtained,  was noted to be elevated from 0.89-2.92.  His abdomen was grossly  distended, although the patient's wife notes that his abdomen appeared  usual.  The patient did not care to eat all day.  CT, noncontrast, was  obtained, because of the lack of urine output over the entire shift, and  because ultrasound was unable immediately.  Noncontrast CT showed that  the patient's Foley catheter was actually outside the bladder into the  abdominal cavity.  Longitudinal CT showed that the catheter actually  traversed the prostatic fossa,  subtrigonally, into the abdominal cavity.  The patient is therefore for repositioning of catheter under anesthesia  and cystographic control.   PROCEDURE:  The Foley catheter was removed, and the cystoscope was  placed in the urethra.  The patient had severe paraphimosis, and gross  edema of the penis.  The phimotic foreskin was reduced.  Cystoscopy was  accomplished, and the distal urethra appeared hemorrhagic, but otherwise  intact.  More proximal ward, the area of the ureter was scoped, and  cystogram was obtained, which appeared to be a bladder, but then  extravasation appeared on the x-ray screen.  Repeat cystoscopy revealed  that this was indeed the perforated urethra.  Cystoscopy was performed  was then manipulated dorsal ward, and true ureter was identified and  traversed.  The prostatic urethra appeared fresh postop, but was not  bleeding.  The bladder itself appeared edematous, hyperemic, reactive.  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  A guidewire was placed in  the bladder, and the cystoscope removed.  Over the guidewire, a 24  Ainsworth catheter was placed, and 30 mL placed in the balloon.  10 mL  of the  fluid is x-ray contrast fluid, and a cystogram was then  performed, which again showed no perforation, and the Foley catheter  seated in the bladder,  which was elevated in the pelvic floor.  The total amount of urine  obtained in the bladder was approximately 100 mL.  It is felt the  patient probably has ATN, will be treated accordingly.   The patient was then awakened, although left intubated, placed in the  intensive care unit.      Sigmund I. Patsi Sears, M.D.  Electronically Signed     SIT/MEDQ  D:  11/01/2006  T:  11/02/2006  Job:  [id number]   cc:   Windy Fast L. Earlene Plater, M.D.  Fax: [phone]

## 2010-05-28 NOTE — H&P (Signed)
NAME:  [name] [name], [name] [name] NO.:  [id number]   MEDICAL RECORD NO.:  [id number]          PATIENT TYPE:  INP   LOCATION:                               FACILITY:  [facility]   PHYSICIAN:  Dani Gobble, MD       DATE OF BIRTH:  27-May-1940   DATE OF ADMISSION:  08/06/2006  DATE OF DISCHARGE:                              HISTORY & PHYSICAL   [name] [name] is a very pleasant 70 year old gentleman with a past medical  history of hypertension, diabetes mellitus, reflux disease, gout, BPH,  chronic prostatitis, diverticulosis, depression and morbid obesity, who  was initially referred for chest discomfort and pre-syncope.   We have pursued a cardiac evaluation which has included an  echocardiogram, which revealed mild left atrial enlargement with  moderate concentric left ventricular hypertrophy and mild aortic  sclerosis without hemodynamically significant stenosis.  Peak velocity  across the aortic valve was 1.7 meters per secondary with a peak  gradient of 11 mmHg and a mean gradient of 7 mmHg with continued overall  reasonable leaflet opening.  He did  have mild mitral regurgitation and  tricuspid regurgitation.  Overall, left ventricular systolic function  was vigorous to hyperdynamic without regional wall motion abnormality  noted.  There did appear to be a small posterior pericardial effusion  without evidence of hemodynamic compromise.  He also had an adenosine  Myoview, which was notable for oxygen desaturation, however, it was  unchanged from the beginning to the end of the test.  It appears his  baseline runs in the 80s for O2 saturations.  Images were negative for  ischemia with an EF of 60%.  Indeed, the images were entirely normal.  He wore a CardioNet which revealed paroxysmal atrial fibrillation both  with RVR as well as controlled ventricular response at variant  intervals.  One of the initial and most important tests that I ordered  was a sleep study.  He has just  recently had that.  This revealed severe  obstructive sleep apnea sleep apnea as I expected.  He had frequent  oxygen desaturation requiring supplemental oxygen.  CPAP titration was  recommended but his CPAP titration study has not occurred as yet.  I  ordered a chest CT as an outpatient, which revealed lingular atelectasis  and mild cardiomegaly with a small pericardial effusion but no evidence  of pulmonary embolus, aortic aneurysm or aortic dissection.  At that  time, he also underwent a CAT scan of his abdomen, which was negative  for aortic aneurysm or dissection.  He did have mild fatty infiltration  of the liver and small benign-appearing bilateral renal cysts but  negative acute.  The CT was obtained due to a chest x-ray which showed  lingular consolidation concerning for pneumonia.   Lab work from May of this year was a negative D-dimer of 0.36, white  blood count 7.8, hematocrit 50.5, platelets 164,000 without left shift,  total cholesterol 167, triglycerides 216, ACL 34, LDL 90, total  bilirubin 0.4, alkaline phosphatase 48, AST 78, ALT 20, total protein  6.9, albumin 4.1, TSH 3.094,  H. pylori 0.7, BNP 121.  He has since had a  pulmonary function test, which showed moderate ventilatory defect with  air flow obstruction and also a typical appearance of a restrictive  change as well.  Lung volumes confirm restrictive change with moderate  reduction in total lung capacity, while the DLCO was mildly reduced.  Arterial blood gas showed severe hypoxemia and an elevated pc02,  suggesting chronic hypoxic respiratory failure and of note, there was  deterioration of pulmonary function with the use of inhaled  bronchodilator.   At his last visit, we had a long discussion regarding atrial  fibrillation with this definition description and discussion of the risk  of untreated atrial fibrillation.  We also discussed the risk and  benefit of Coumadin as well.  Certainly, recommended  Coumadin for a  variety of reasons, not the least of which is his atrial fibrillation  but additionally with his marked hypoxemia.  I feel he is at high risk  for an embolic phenomenon as well.  I recommended starting him on  metoprolol low dose due to his vigorous LV function and likely  significant diastolic dysfunction.  However, although we recommended  these medications, he did not wish to start either the Coumadin or the  metoprolol and has not yet done so.   He is here today in followup.  Clearly, as soon as I walked into the  room, I could see a marked change in this gentleman.  He clearly is not  feeling well.  His lips are blue, and they have been in the past but  they are much more gray-blue than they have been previously.  He is very  short of breath.  He reports he felt like he had pneumonia last week.  He has marked orthopnea and paroxysmal nocturnal dyspnea and has not  been sleeping well.  He reports chest discomfort which has been worse  recently.  It does appear to be exertional in nature.  According to his  report and his wife's, his oxygen saturation today is 75%, which is  lower than his baseline that we have checked previously.  He denies  palpitations, however, he has never been aware of his atrial  fibrillation.  He has no lower-extremity edema.  He has been lightheaded  without frank pre-syncope or syncope.   I have recommended hospitalization at [facility] with plans for in-house  cardiac evaluation, possibly to include a right and left heart  catheterization but he will not be a candidate for this at present, as  he is too short of breath to lay flat.  Additionally, he needs to be  started on BiPAP or CPAP promptly.  I would recommend stat pulmonary  consult as well.  Both he and his wife appear amenable to this plan.   Since last visit he has also been started on treatment for depression  with Zoloft, and he has presumed benign prostatic hypertrophy being on   Flomax.   Another consideration this brings to mind, particularly with his  diabetic status is the contrast challenge he had, such that renal  failure may be need to be considered, although his last check of his  renal function prior to this was entirely normal with a creatinine of  0.97 and a BUN of 18.   PAST MEDICAL HISTORY:  As outline above.   ALLERGIES:  KEFLEX AND CECLOR.   CURRENT MEDICATIONS:  1. Ibuprofen 800 daily.  2. Benazepril/hydrochlorothiazide 20/12.5 one-half tablet daily.  3.  Allopurinol 300 daily.  4. Glimepiride 2 mg daily.  5. Avandia 4 mg daily.  6. Flomax 0.4 mg daily.  7. Trimethoprim 100 mg daily.  8. Zoloft 50 mg daily.  9. Nexium 40 mg daily.   SOCIAL HISTORY:  He is married with one son.  He is retired from  [employer].  He has had remote tobacco use.  He denies  alcohol or illicit drug use.   FAMILY HISTORY:  Notable for father with prostate cancer and coronary  artery disease.  Diabetes and strokes also run in his family.   REVIEW OF SYSTEMS:  General as is outline above, otherwise negative.  MUSCULOSKELETAL:  Notable for arthritis in both hips.  SKIN:  Negative.  EYES:  He wears glasses, and I have recommended previously he see an  ophthalmologist yearly given his diabetic status.  RESPIRATORY AND  CARDIOVASCULAR:  As outline above.  GASTROINTESTINAL:  Notable for  reflux disease but no diarrhea, constipation or blood in his stool.  GENITOURINARY:  He has benign prostatic hypertrophy with hesitancy.  ENDOCRINE:  Notable for diabetes with previous Accu-Cheks at 110 to 130  fasting.  NEUROLOGICAL:  As outlined above.  ENT:  Seasonal allergies.  PSYCHIATRIC:  Notable for depression recently treated.  HEMATOLOGY AND  LYMPHATICS:  Negative.  VASCULAR:  Unclear by history, whether he has  claudication or not.  He also has complaints of recent nausea.   PHYSICAL EXAMINATION:  Reveals a pleasant, morbidly obese white male who  appears  clearly decompensated with lips bluish-gray and some lethargy  and daytime somnolence.  He is clearly lethargic today.  He does appear  to be alert and oriented.  Height is 5 feet 9 inches tall.  He weighs 260 pounds, down 9 pounds.  Blood pressure 140/80 with a heart rate of 96.  NECK:  Negative for JVD at 90 degrees, however, body habitus precludes  this definitively.  No obvious bruits are appreciated.  There is no  lymphadenopathy, no thyromegaly.  LUNGS:  Reveal bibasilar crackles and diminished breath sounds at the  bases but otherwise distant throughout.  CARDIAC EXAMINATION:  Reveals a regular rate and rhythm with a 1-2/6  systolic murmur, which is unchanged.  ABDOMEN:  Obese, nontender, nondistended with positive bowel sounds.  Lower extremities are notable for trace edema.  Previously, his distal  pulses were 2+.   An EKG performed in the office today reveals borderline resting sinus  tachycardia at 96 beats per minute with PAC with non-specific ST  changes.  As compared to last EKG, heart rate has increased and non-  specific changes new.   IMPRESSION:  1. Chest discomfort.  2. Worsening shortness of breath and density on minimal exertion,      which is progressive.  3. Paroxysmal nocturnal dyspnea, also progressive.  4. Hypertension.  5. Diabetes mellitus.  6. Reflux disease.  7. Gout.  8. Benign prostatic hypertrophy.  9. Chronic prostatitis.  10.Diverticulosis.  11.Depression.  12.Morbid obesity.  13.Abnormal PFTs as outlined above with a pattern that was interpreted      as consistent with chronic hypoxia and respiratory failure and      severe hypoxemia on the arterial gas.  14.Images on adenosine Myoview negative for ischemia.  15.Echocardiogram as outline above with vigorous to hyperdynamic left      ventricular function with moderate concentric left ventricular      hypertrophy and likely diastolic dysfunction as well as small  pericardial effusion  without hemodynamic compromise.  16.Clearly, decompensated today.  17.Severe obstructive sleep apnea which may be his most pressing issue      at this point.  18.Paroxysmal atrial fibrillation, not yet on Coumadin or beta blocker      due to patient's preference.   RECOMMENDATIONS:  1. Admit to [facility] to intensive care unit or stepdown.  2. Rule out MI protocol with serial enzymes.  3. Urgent pulmonary consult.  4. CPAP or BiPAP promptly.  5. He will likely need a right and left heart catheterization this      admission, however, he is not currently a candidate as he is unable      to lie flat.  6. Stat labs to include CBC with diff, CMP, TSH, PT, PTT, D-dimer and      serial enzymes.  7. Hold ibuprofen and ACE inhibitor until we see his renal function.  8. Hold allopurinol for now.  9. IV heparin per pharmacy.  10.Would not start a beta blocker until we see what a brain      natriuretic peptide looks like and a repeat chest x-ray.  11.This gentleman is a setup for deep vein thrombosis so we will need      to be aggressive in this regard and see if we can persuade him      regarding Coumadin at some point during hospitalization.  12.Further plan dependent on the above noted test results.           ______________________________  Dani Gobble, MD     AB/MEDQ  D:  08/06/2006  T:  08/06/2006  Job:  [id number]   cc:   Patrica Duel, M.D.

## 2010-05-28 NOTE — Discharge Summary (Signed)
NAME:  [name] [name], [name] [name] NO.:  [id number]   MEDICAL RECORD NO.:  [id number]          PATIENT TYPE:  INP   LOCATION:  3709                         FACILITY:  [facility]   PHYSICIAN:  Kalman Shan, MD   DATE OF BIRTH:  August 26, 1940   DATE OF ADMISSION:  07/01/2007  DATE OF DISCHARGE:  07/06/2007                               DISCHARGE SUMMARY   DISCHARGE DIAGNOSES:  1. Acute on chronic respiratory failure, multifactorial in nature in      the setting of decompensated obstructive sleep apnea, obesity      hypoventilation syndrome, diastolic heart failure, secondary      pulmonary artery hypertension, cor pulmonale, and recurrent right      effusion.  Complicated by underlying chronic obstructive pulmonary      disease.  2. Chronic atrial fibrillation with diastolic dysfunction.  3. Diabetes type 2.  4. Obesity.   LABORATORY DATA:  PT/INR 12.5/0.9.  This was obtained on July 06, 2007.  Date July 05, 2007, Sodium 143, potassium 53.7, chloride 93, CO2 41,  glucose 157, BUN 22, and creatinine 0.84.  Date July 05, 2007, white  blood cell count 12.9, hemoglobin 13, hematocrit 38.6, and platelet  count 231,000.  Date July 04, 2007, BNP 108.   Chest x-ray obtained on July 05, 2007, demonstrates chronic left volume  loss, and improvement in recurrent right effusion.   BRIEF HISTORY:  [name] [name] is a 70 year old male patient well known to  the [facility] followed by Dr. Craige Cotta in the outpatient  setting.  He has multiple cardiopulmonary pathologies including severe  obesity hypoventilation syndrome, chronic sleep apnea, chronic  obstructive pulmonary disease demonstrated by pulmonary function  testing, cor pulmonale, history of chronic left volume loss following  esophageal surgery, and recurrent right effusion complicated by  underlying diastolic dysfunction and occasionally atrial fibrillation  with rapid ventricular response.  He presented on July 01, 2007,  with 1-  week history of slowly progressive shortness of breath.  At baseline, he  claims he can walk approximately 50 wards with his walking stick on 5 L  nasal cannula nonstop, upon time of presentation he could walk this  distance but required frequent rest in between.  On presentation, Mr.  [name] did demonstrate new right effusion.  He was therefore admitted for  IV diuresis and further evaluation.   HOSPITAL COURSE BY DISCHARGE DIAGNOSES:  1. Acute on chronic respiratory failure, multifactorial in nature.  In      the setting of recurrent right pleural effusion, complicated by      chronic left-sided volume loss, in the setting of probable      decompensated diastolic heart dysfunction, further complicated by      chronic obesity hypoventilation syndrome, cor pulmonale, secondary      pulmonary artery hypertension, and known chronic obstructive      pulmonary disease by pulmonary function tests.  Essentially, no      significant changes were made to [name] [name]'s baseline therapy with      the exception of aggressive diuresis during the inpatient setting.  He was treated aggressively with IV furosemide, his chest x-ray was      monitored daily, initially his Coumadin was held with consideration      for thoracentesis of the recurrent right effusion.  However, the      effusion managed to resolve with aggressive diuresis, and therefore      no thoracentesis was warranted.  He continued to improve with      aggressive diuresis over the course of his hospitalization to the      point where he is now back to baseline 5 L nasal cannula able to      tolerate his baseline activity function.  [name] [name] continues to      complain of significant daytime somnolence, and episodes of      desaturation while on his continuous positive airway pressure      machine.  In reviewing the records, his last polysomnogram was in      September 2008 for a titration study, at which time bilevel       positive airway pressure of 12/8 was suggested.  He will be      followed by Dr. Craige Cotta on July 14, 2007, at which time we can consider      further evaluation of his current bilevel positive airway pressure      therapy.  Furthermore, I did speak to Dr. Domingo Sep prior to      discharge discussing [name] [name]'s case and events during this      current hospitalization, she suggested adding metolazone to his      diuretic regimen twice weekly hopefully to prevent recurrence of      the effusion which we have added.  He will be discharged home to      continue his typical regimen which includes nocturnal bilevel      positive airway pressure, continuous oxygen, daily Spiriva, daily      Lasix; however, he will be escalated to twice daily dosing of      furosemide in addition to metolazone twice weekly.  Additionally,      he will continue a focus on increasing activity, and close      outpatient follow up.  2. Chronic atrial fibrillation and diastolic dysfunction.  No      significant changes were required to this therapeutic regimen.  He      did have his metoprolol changed to bisoprolol during this      hospitalization, presumably secondary to underlying obstructive      lung disease.  He has had atrial fibrillation with controlled      ventricular response by telemetry during his hospitalization.  Of      note, his Coumadin was discontinued during the hospitalization      acutely in anticipation of possible thoracentesis.  He will be      discharged to home on a subtherapeutic international normalized      ratio; however, his warfarin will be resumed, and he has      instructions to follow up at Dr. Roque Lias office for further      Coumadin dosing.  3. Hypertension.  No change in his regimen was required for this      therapy.  4. Diabetes type 2.  For this, no changes were made in his Lantus      therapy.  5. Morbid obesity.  This is certainly a complicating factor in all of      his  medical issues, lifestyle  changes were again encouraged.   DISCHARGE INSTRUCTIONS:  [name] [name] was instructed to follow a low-  sodium, carbohydrate-modified diet.   DISCHARGE MEDICATIONS:  1. Spiriva 18 mcg cap one puff daily.  2. Xopenex 2 puffs up to four times a day as needed for breakthrough      shortness of breath.  3. Oxygen 5 L at all times.  4. Sertraline hydrochloride 100 mg p.o. daily.  5. Nexium 40 mg p.o. daily.  6. Glimepiride 2 mg p.o. daily.  7. Coumadin 5 mg tab, he was instructed to take one and half tablets      daily until Thursday, at which time he will be directed furthermore      by his clinic.  8. Avodart 0.5 mg tab daily.  9. Simvastatin 20 mg tab daily.  10.Allopurinol 300 mg tab daily.  11.Diltiazem hydrochloride 180 mg daily.  12.Bisoprolol, he has been instructed to take two 5 mg tablets twice a      day.  13.Furosemide 40 mg tab twice a day.  14.Metolazone 5 mg tab, instructed to take 30 minutes before his a.m.      dose of Lasix on Tuesdays and Fridays.  15.Avapro 300 mg tab daily.  16.Lantus 15 units subcu daily.  17.Nasacort AQ 2 sprays each nostril daily.  18.Mucinex DM 1-2 tablets every 12 hours as needed.   FOLLOW UP:  He has follow up with Dr. Coralyn Helling on July 14, 2007, at  which time we will also check a blood chemistry.  He will also see Dr.  Roque Lias office on July 08, 2007, for follow up PT/INR, and then July 29, 2007, he will see Dr. Domingo Sep again for follow up of his congestive  heart failure and volume status issues.   Please note, [name] [name] has reached maximum benefit from inpatient care  and the remainder of his care will be directed through the outpatient  setting.   Greater than 30 minutes spent in discharge planning      Zenia Resides, NP      Kalman Shan, MD  Electronically Signed    PB/MEDQ  D:  07/06/2007  T:  07/07/2007  Job:  [id number]   cc:   Coralyn Helling, MD  Reita Chard

## 2010-05-28 NOTE — Procedures (Signed)
NAME:  [name] [name], [name] [name] NO.:  [id number]   MEDICAL RECORD NO.:  [id number]          PATIENT TYPE:  OUT   LOCATION:  SLEEP CENTER                 [facility]ACILITY[facility]   PHYSICIAN:  Coralyn Helling, MD        DATE OF BIRTH:  Jan 15, 1940   DATE OF STUDY:  10/04/2006                            NOCTURNAL POLYSOMNOGRAM   REFERRING PHYSICIAN:  Coralyn Helling, MD   REFERRING PHYSICIAN:  Dr. Coralyn Helling.   INDICATION FOR STUDY:  This is an individual who has a history of sleep  apnea, but continues to have symptoms of excessive daytime sleepiness  and sleep disruption.  He is referred to the sleep for a BiPAP titration  study.   EPWORTH SLEEPINESS SCORE:  4   MEDICATIONS:  None listed.   SLEEP ARCHITECTURE:  The total recording time was 433 minutes.  Total  sleep time was 316 minutes.  Sleep efficiency was 73%.  Sleep latency ws  52 minutes, which was prolonged.  REM latency was 287 minutes, which was  prolonged.  The patient was observed in all stages of sleep and had a  significant increase in the percentage of REM sleep, particularly in the  second half of the study.  The patient was observed in both the supine  and non-supine position.   RESPIRATORY DATA:  The average respiratory rate was 22.  The patient was  titrated from a BiPAP pressure setting of 8/4 to 12/8.  At a BiPAP  pressure setting of 12/8, the apnea-hypopnea index was initially 0.8.  At this pressure setting, the patient was observed in REM sleep but not  supine sleep, and snoring was eliminated.   OXYGEN DATA:  The baseline oxygenation was 89%.  The oxygen saturation  nadir was 74%.  The patient was titrated to an oxygen level of 2-L nasal  cannula in addition to BiPAP.  With a combination of BiPAP of 12/8 and  oxygen at 2 L by nasal cannula, the mean oxygenation during non REM  sleep was 92%, the mean oxygenation during REM sleep was 92%, the  minimal oxygenation during non REM sleep was 91% and the minimal  oxygenation during REM sleep was 90%.   CARDIAC DATA:  The average heart rate was 69 and the rhythm strip showed  atrial fibrillation.   MOVEMENT/PARASOMNIA:  The periodic limb movement index was 40.  The  patient did have increase in muscle tone during REM sleep and was noted  by the technician to have episodes of kicking and punching during REM  sleep.   IMPRESSIONS/RECOMMENDATIONS:  This was a bi-level positive airway  pressure titration study.  At bi-level positive airway pressure setting  of 12/8 with the addition of 2 L of oxygen, the apnea-hypopnea index was  reduced to 0.8.  At this pressure setting, the patient was observed in  rapid eye movement sleep, but not supine sleep and snoring was  eliminated.  In addition, the patient appeared to have episodes where he  was acting out his dreams with kicking and fighting as well as increased  muscle tone during REM sleep.  Clinical correlation would be necessary  to determine the significance of this and whether this is related to his  severe sleep apnea or if he has REM sleep behavior disorder.      Coralyn Helling, MD  Diplomat, [employer] Board of Sleep Medicine  Electronically Signed     VS/MEDQ  D:  10/12/2006 17:13:03  T:  10/13/2006 07:16:18  Job:  [id number]

## 2010-05-28 NOTE — H&P (Signed)
NAME:  TORRE, SCHAUMBURG NO.:  [id number]   MEDICAL RECORD NO.:  [id number]          PATIENT TYPE:  INP   LOCATION:  2620                         FACILITY:  [facility]   PHYSICIAN:  Leslye Peer, MD    DATE OF BIRTH:  Mar 21, 1940   DATE OF ADMISSION:  03/06/2007  DATE OF DISCHARGE:                              HISTORY & PHYSICAL   CHIEF COMPLAINT:  Progressive dyspnea.   HISTORY:  Mr. Krontz is a 70 year old man with a history of obstructive  sleep apnea and obesity hyperventilation syndrome, with resultant  chronic respiratory failure.  He also has coronary artery disease, has  been medically managed with an intact left ventricular function,  pulmonary hypertension, chronic atrial fibrillation, hypertension, and  remote tobacco use with questionable air flow limitation by prior PFTs.  He is not maintained on any bronchodilator regimen.  He presented to the  emergency department at [facility] on February 21 complaining of several  months a slowly progressive dyspnea, both at rest and especially with  exertion.  He tells me that he has had some weight gain.  He is not sure  how much he has gained.  His abdomen is more protuberant.  He believes  that it is more difficult for him to take a deep breath.  He denies any  cough, fever, chills or sputum production.  He is followed by Dr. Kem Boroughs at [facility] and was scheduled to have a  transthoracic echocardiogram this week, to assess right and left heart  function.  He says that his breathing got so bad that he needed to be  evaluated sooner and presented to the emergency department tonight.  He  reports that, over the last couple weeks, his oxygen saturations have  been dropping into the 80s with ambulation.  When he uses usual 3 liters  per minute, he can barely walk across the room without becoming severely  fatigued and dyspneic.  He will be admitted for progressive dyspnea and  hypoxemia.   PAST  MEDICAL HISTORY:  1. Coronary artery disease medically managed with an intact left      ventricular function.  2. Mild pulmonary hypertension by cardiac catheterization, with PA      pressures of 41/16 and a mean pressure 30 in August 2008.  3. Chronic atrial fibrillation.  4. Hypertension.  5. Obstructive sleep apnea with obesity hypoventilation syndrome.  He      is maintained on BiPAP at 12/8 with 4 liters of oxygen at night.  6. GERD.  7. Gout.  8. Hypertension.  9. Diabetes.  10.Possible air flow limitation on pulmonary function testing from      June 2008, with a remote tobacco history.  He is not maintained on      bronchodilators.  11.Multiple episodes of pneumonia.  12.Status post TURP in October 2008.  13.Cystourethroscopy and cystogram, after a transurethral Foley      catheter placement in October 2008.   ALLERGIES:  1. KEFLEX.  2. SEPTRA.   CURRENT MEDICATIONS:  1. Glimepiride 2 mg daily.  2. Nexium 40 mg  daily.  3. Simvastatin 20 mg daily.  4. Sertraline 100 mg daily.  5. Diltiazem extended release 240 mg daily.  6. Avodart 0.5 mg daily.  7. Allopurinol 300 mg daily.  8. Metoprolol 25 mg b.i.d.  9. Torsemide 10 mg daily.  10.Potassium chloride 10 mEq once daily.  11.Coumadin as directed, which is typically 5 mg daily.   SOCIAL HISTORY:  The patient is retired and works at the [employer].  He has a remote tobacco use himself, has not used for  many many years.  He is not using alcohol.   FAMILY HISTORY:  Significant for father with prostate cancer and  coronary artery disease.  He also has a history of diabetes and strokes  in the family.   EXAMINE:  GENERAL:  This is an obese man who is in no respiratory  distress, on 3 liters per minute by nasal cannula.  VITAL SIGNS:  Temperature 97.2, blood pressure 110-170/60-98, heart rate  76, SPO2 94% on 3 liters per minute at rest.  His respiratory is 20 to  30 per minute.  LUNGS:  He had  bilateral small volumes with inspiratory crackles at both  bases, up to the midlung.  HEART:  Irregularly irregular without murmur.  ABDOMEN:  Obese, protuberant, tympanitic with positive bowel sounds.  EXTREMITIES:  Have 1-2+ plus pitting pre-tibial edema.  NEUROLOGIC:  He is awake, alert with a nonfocal exam.   LABORATORY:  Venous blood gas had a pH of 7.39, pCO2 of 59, and a bicarb  of 35, sodium 143, potassium 3.9, chloride 105, CO2 37, BUN 13,  creatinine 0.9, glucose 117, hematocrit 46, hemoglobin is 15.6, INR 1.2,  BNP 299, it was 224 in November 2008.  White blood cell count 13.1, with  a normal differential.  Hematocrit 41.5, platelets 245.   Chest x-ray in the emergency department tonight shows bilateral  interstitial infiltrates, with bilateral lower lobe atelectasis versus  infiltrate, most consistent with pulmonary edema and possibly evolving  bilateral effusions, more so on the right than on the left.   IMPRESSION:  1. Acute on-chronic respiratory failure which is multifactorial but      which likely represents decompensated left and right-sided heart      failure in the setting of hypertension, atrial fibrillation,      possible diastolic dysfunction.  All this is superimposed on his      known obesity hypoventilation syndrome and obstructive sleep apnea.      He tells me that has been compliant with his BiPAP and with his      diuretics.  I will increase his diuresis and follow his chest x-      ray.  A transthoracic echocardiogram will be performed, and we will      consult Dr. Domingo Sep in [facility] cardiology.  We will continue      his BiPAP at 12/8 with 4 liters of oxygen bled in at night.  This      is his stable regimen, and he can have this on a regular floor      without needing to go to step-down.  I will not initiate any      standing bronchodilators, since he does not wheeze, but I will      order albuterol to be available on an as-needed basis.  He did  not      having signs or symptoms of pneumonia, and I will not treat him  with antibiotics.  2. Hypertension.  I will continue his usual regimen.  3. Atrial fibrillation.  4. Coronary artery disease with observed EF.  5. Pulmonary hypertension.  6. History of ureteral perforation in October 2008, with a TURP in      October 2008.  I believe that he will need a Foley catheter, as he      is not mobile, and we are going to diurese him.  We will place a      Foley.  If it is not easily placed, then will consult urology for      assistance.  7. Will continue his proton pump inhibitor.  8. Gout.  Will treat him with his allopurinol.  9. Hyperlipidemia.  Continue simvastatin.  10.Prophylaxis will be with enoxaparin and his usual Nexium.      Leslye Peer, MD  Electronically Signed     RSB/MEDQ  D:  03/06/2007  T:  03/07/2007  Job:  [id number]   cc:   Coralyn Helling, MD  Dani Gobble, MD  Patrica Duel, M.D.

## 2010-05-28 NOTE — Op Note (Signed)
NAME:  [name] [name], [name] [name] NO.:  [id number]   MEDICAL RECORD NO.:  [id number]          PATIENT TYPE:  INP   LOCATION:  1414                         FACILITY:  [facility]   PHYSICIAN:  Lucrezia Starch. Earlene Plater, M.D.  DATE OF BIRTH:  October 19, 1940   DATE OF PROCEDURE:  10/30/2006  DATE OF DISCHARGE:                               OPERATIVE REPORT   PREOPERATIVE DIAGNOSIS:  BPH (benign prostatic hypertrophy), urinary  retention, questionable bladder mass.   OPERATIVE PROCEDURE:  Cysto, TURP.   SURGEON:  Laurin Coder, MD   ANESTHESIA:  Spinal.   ESTIMATED BLOOD LOSS:  One hundred mL.   TUBES:  A 26 French 30 mL balloon three way catheter.   COMPLICATIONS:  None.   INDICATIONS FOR PROCEDURE:  [name] is a very nice 70 year old white  male who presented with gross hematuria and urinary retention.  He was  on Coumadin and the Coumadin was stopped.  He has failed voiding trials  and on urodynamics was found to have a good detrusor contraction with  obstruction.  After understanding the risks, benefits and alternatives  he elected to proceed with cysto, TURP and possible bladder biopsy.   PROCEDURE IN DETAIL:  The patient was placed in a supine position and  after proper spinal anesthesia was placed in the dorsal lithotomy  position and prepped and draped with Betadine in a sterile fashion.  Urethra was calibrated with 30 [place] and a 28 [place] call [employer]  continuous flow resectoscope sheath was placed over a [place]  obturator.  Inspection of the bladder revealed it was diffusely  thickened but no specific lesions were seen.  It was almost assuredly  from the urinary retention and the catheter in place.  Efflux of clear  urine was noted from the normally placed ureteral orifices bilaterally.  There was significant trilobar hypertrophy with a medium bar.  The  working element was placed with a 12 degree lens.  The medium bar was  resected to the bladder neck.  Tissue was  resected in the 6 o'clock  position on the surgical capsule to bury the verumontanum.  The right  lobe of the prostate was resected then the left lobe was resected.  Apical tissue was then approached and noted to be quite voluminous and  was resected avoiding external sphincter and the verumontanum.  Overhanging tissue in the 12 o'clock position was then serially resected  to the surgical capsule.  Good hemostasis was noted to be present with  Bovie coagulation cautery.  Chips were evacuated with a Toomey syringe.  Reinspection revealed there were no chips within the bladder.  Prostatic  fossa was widely resected.  There were no significant perforations  noted.  Good  hemostasis was present.  The resectoscope sheath was removed.  Specimen  was submitted to pathology.  A 26 French 30 mL balloon three way Foley  catheter was placed.  Bladder irrigated clear and the patient was taken  to the recovery room stable.      Ronald L. Earlene Plater, M.D.  Electronically Signed     RLD/MEDQ  D:  10/30/2006  T:  10/31/2006  Job:  [id number]

## 2010-05-28 NOTE — Procedures (Signed)
NAME:  KHYLIN, GUTRIDGE NO.:  [id number]   MEDICAL RECORD NO.:  [id number]          PATIENT TYPE:  REC   LOCATION:  RAD                           FACILITY:  [facility]   PHYSICIAN:  Dani Gobble, MD       DATE OF BIRTH:  09-19-1940   DATE OF PROCEDURE:  06/18/2006  DATE OF DISCHARGE:                                  STRESS TEST   INDICATIONS:  Chest discomfort.   ELECTROCARDIOGRAPHIC/HEMODYNAMIC DATA:  Baseline 12-lead EKG reveals  normal sinus rhythm with nonspecific ST changes, with late transition.  Baseline heart rate was 70 beats per minutes.  Baseline blood pressure  was 148/82 mmHg.  Adenosine was infused per protocol.  He experienced  some stinging in his face and mild chest discomfort, which resolved by  test end.  EKG during infusion revealed slightly more prominent  nonspecific ST changes.  No definitive ischemic changes were noted.  Peak blood pressure was unchanged and the heart rate increased to 104,  but decreased back to the 80s by test end.   IMPRESSION:  1. Electrocardiogram negative for ischemia.  2. Centrographic images are pending.   ADDENDUM:  Oxygen saturations were diminished, ranging in the 80s, but  the patient at baseline.  He is scheduled for a sleep study in the near  future.           ______________________________  Dani Gobble, MD     AB/MEDQ  D:  06/18/2006  T:  06/18/2006  Job:  [id number]   cc:   Patrica Duel, M.D.  Fax: [phone]

## 2010-05-28 NOTE — Cardiovascular Report (Signed)
NAME:  [name] [name], [name] [name] NO.:  [id number]   MEDICAL RECORD NO.:  [id number]          PATIENT TYPE:  INP   LOCATION:  2925                         FACILITY:  [facility]   PHYSICIAN:  Ritta Slot, MD     DATE OF BIRTH:  May 01, 1940   DATE OF PROCEDURE:  08/25/2006  DATE OF DISCHARGE:                            CARDIAC CATHETERIZATION   PROCEDURE PERFORMED:  1. Selective coronary angiography of the native coronary circulation      by Judkins technique.  2. Retrograde left heart catheterization.  3. Left ventricular angiography.  4. Right heart catheterization.   COMPLICATIONS:  None.   ENTRY SITE:  Right femoral artery and right femoral vein.   DYE USED:  Omnipaque.   PATIENT PROFILE:  The patient is a pleasant 70 year old gentleman with a  past medical history of hypertension, diabetes, morbid obesity,  obstructive sleep apnea who was admitted on August 06, 2006, to [facility] in respiratory distress requiring intubation for 3 days.  He  presented with symptoms of chest pain and shortness of breath.  He has  no known history of coronary disease, and in fact, recently underwent a  Myoview scan, which was negative and demonstrated an injection fraction  of 60%.  He was extubated on August 09, 2006.  He has a history of atrial  fibrillation and is now here for further evaluation of cardiac etiology  for his respiratory distress.  In particular, he is here to evaluate the  coronary arteries and to evaluate evidence of pulmonary hypertension  secondary to obstructive sleep apnea.   RESULTS:  1. Pressures:  The hemodynamics show an RA pressure of 15/12/10, RV      pressures 14/3/8.  Pa pressures 41/16/30.  Pulmonary capillary      wedge pressure 23/19/18, RV pressures 40/1/9.  LV pressures      130/10/32.  Aortic pressure 131/87/109.  Cardiac index by FICK 2.9,      cardiac output by FICK 6.6, cardiac index by thermal was 2.8,      cardiac output was 6.4.  2.  Angiographic results:  The left main coronary artery was normal,      left anterior descending artery was normal without evidence of      significant stenosis. The left circumflex system showed a mild 40%      lesion in the proximal OM which was not deemed to be significant.      The right coronary artery was very large, dominant with a large PDA      in posterior lateral branch with no evidence significant stenoses      although there was mild atherosclerosis seen on coronary      angiography.  LV function was normal with no evidence of mitral      regurgitation.  There was no evidence of pullback gradient from the      LV to the aorta.   FINAL DIAGNOSES:  1. Mild pulmonary hypertension with a PA pressure of 41/16 with a mean      of 30.  The patient could do with some diuresis  with capillary      wedge of 23/19 with a mean of 18.  His LVEDP was 32.  Mild      atherosclerosis but no significant coronary artery disease.  2. Normal ejection fraction.  3. Normal left ventricular systolic function.   PLAN:  The patient should be managed with medical therapy and further  diuresis.  He may need to hold his Lovenox for tonight and stop  anticoagulation with Coumadin tomorrow if this is deemed to be  appropriate for his atrial fibrillation.  Dr. Elsie Lincoln was present and  supervised the entire procedure.  I was the first operator.      Ritta Slot, MD  Electronically Signed     HS/MEDQ  D:  08/25/2006  T:  08/25/2006  Job:  [id number]   cc:   Dani Gobble, MD  Patrica Duel, M.D.

## 2010-05-28 NOTE — Discharge Summary (Signed)
NAME:  [name] [name], [name] [name] NO.:  [id number]   MEDICAL RECORD NO.:  [id number]          PATIENT TYPE:  INP   LOCATION:  2005                         FACILITY:  [facility]   PHYSICIAN:  Oretha Milch, MD      DATE OF BIRTH:  July 19, 1940   DATE OF ADMISSION:  05/09/2007  DATE OF DISCHARGE:  05/14/2007                               DISCHARGE SUMMARY   FINAL DIAGNOSES:  1. 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  2. Chronic atrial fibrillation.  3. Diabetes type 2.  4. Hyperlipidemia.  5. Gastroesophageal reflux disease.  6. Environmental allergies.   LABORATORY DATA:  Date May 12, 2007, hemoglobin 13.1, hematocrit 39.9,  white blood cell count 11.4, and platelet count 210.  Date May 12, 2007, sodium 137, potassium 4, chloride 92, CO2 37, BUN 26, creatinine  1.02, glucose 220.  Date May 09, 2007, urine strep antigen negative.  Date May 09, 2007, urine Legionella negative, cardiac enzymes  negative, BNP 326.   BRIEF HISTORY:  This is a 70 year old male patient well known to the  pulmonary service admitted to the hospital on May 09, 2007 with  decompensated diastolic congestive heart failure, pulmonary edema, and  progressive lower extremity swelling.  He presented to the hospital with  acute respiratory distress, placed on noninvasive positive pressure  ventilation with improvement in symptomatology.   HOSPITAL COURSE BY DISCHARGE DIAGNOSES:  1. Acute on chronic respiratory failure secondary to decompensated      obesity hypoventilation syndrome, probable cor pulmonale, diastolic      dysfunction with volume overload, question of community-acquired      pneumonia (no organism specified), and  underlying chronic left      hemidiaphragm following esophageal surgery.  Mr. [name] was admitted      initially to the intensive care service.  Treatment consisted of      inhaled bronchodilators, noninvasive positive pressure ventilation,      aggressive diuresis, and IV systemic steroids.  He continued to      improve over the course of his hospitalization.  Upon time of      discharge, his WBC count was trending down.  His breathing was      returning to baseline function, however, he continues to report      productive cough with significant postnasal drip.  Therefore, upon      time of discharge he will be discharged to home on nasal steroids,      p.r.n. bronchodilator, and to continue a increased diuretic      regimen.  Additionally, he will continue his supplemental oxygen at      all times and nightly BiPAP.  He has close followup arranged in the      outpatient setting.  2. Chronic atrial fibrillation.  In the past, this  has been a      contributor to his dyspnea, however, this was stable during this      hospitalization and he was continued on his maintenance regimen.      During hospitalization, he did have mild elevation in INR,      secondary to Avelox administration.  Upon time of discharge, his      INR is 1.8.  He will continue his home dosing of Coumadin and be      followed up in Dr. Roque Lias office.  3. Hyperlipidemia.  For this, he will continue his outpatient regimen.  4. Diabetes type 2 with hyperglycemia.  This was exacerbated by      systemic steroid administration.  This is now off.  Blood glucose      remained elevated, but is improved.  He will resume nighttime      Lantus dosing, as well as oral hypoglycemic agent.  5. Environmental allergies.  For this, we will trial on Nasacort in      the outpatient setting to see if this improves postnasal drip and      cough.   DISCHARGE INSTRUCTIONS:  He is instructed to follow a heart-healthy low-  sodium diet.   Increase activity as tolerated.  Continue home  supplemental oxygen and supplemental noninvasive positive pressure  ventilation.  He will be discharged to home on the following medication  regimen.   DISCHARGE MEDICATIONS:  1. Glimepiride 2 mg p.o. daily.  2. Nexium 40 mg p.o. daily.  3. Simvastatin 20 mg p.o. daily.  4. Sertraline hydrochloride 100 mg daily.  5. Diltiazem ER 180 mg daily.  6. Avodart 0.5 mg daily.  7. Allopurinol 300 mg daily.  8. Metoprolol 25 mg p.o. b.i.d.  9. Furosemide 40 mg daily.  10.Potassium chloride 20 mEq daily.  11.Coumadin 7.5 mg daily, and dosed per Dr. Roque Lias office.  12.Lantus 15 units subcu daily.  13.Avelox 400 mg tabs p.o. daily x2 more days and then discontinue.  14.Nasacort 2 sprays each nostril daily.  15.Mucinex 2 tablets twice a day.  16.Xopenex HFA 2 puffs q.6 h. p.r.n. shortness of breath.   DISPOSITION:  [name] [name] has now met maximum benefit from inpatient stay.  He is cleared medically for discharge to home where he will undergo  close outpatient followup with [name] [name] on May 18, 2007, in our  clinic, also will see Dr. Craige [name] [name] on June 1.      [name] Resides, NP      Oretha Milch, MD  Electronically Signed    PB/MEDQ  D:  05/14/2007  T:  05/15/2007  Job:  [id number]   cc:   [name] Oaks, NP  Coralyn Helling, MD  Dani Gobble, MD

## 2010-05-28 NOTE — Assessment & Plan Note (Signed)
[facility]                             PULMONARY OFFICE NOTE   NAME:[name], [name] MARZELLA                      MRN:          [id number]  DATE:09/23/2006                            DOB:          [birth year]    I saw [name] [name], with his wife, in followup today for his severe  obstructive sleep apnea as well as obesity hyperventilation and  hypoxemia.   Since his hospital discharge on August 17th, he has been started on  BiPAP and he was also given supplemental oxygen.  He says that his  breathing has been doing very well.  He still gets quite dyspneic with  exertion but he is not having any problems as far as wheezing or chest  congestion.  He does have an occasional cough with a tickle in his  throat but is not having much as far as sputum production.  He has since  bought a portable oximeter and checks his oxygen level and he says that  when he does this his oxygen level remains normal, and as a result he  stopped using supplemental oxygen both during the day as well as at  night.  He is currently using a full face mask with heated  humidification.  He is using his BiPAP machine on a nightly basis.  Again, he is on an auto BiPAP machine at the present time.  His wife  says that he is sleeping much better and he is not having any problems  as far as acting out his dreams or punching or kicking.  He also feels  like he has more energy during the day.   CURRENT MEDICATIONS:  1. Sertraline 100 mg daily.  2. Flomax 0.4 mg daily.  3. Nexium 40 mg daily.  4. Glimepiride 2 mg daily.  5. Trimethoprim 100 mg b.i.d.  6. Coumadin as directed.  7. Avodart 0.5 mg daily.  8. Simvastatin 20 mg daily.  9. Allopurinol 300 mg daily.  10.Diltiazem XR 240 mg daily.  11.Diovan 40 mg daily.   PHYSICAL EXAM:  He is 250 pounds, temperature 98, blood pressure 130/88,  heart rate is 91, oxygen saturation 92% on room air.  HEENT:  There is no sinus tenderness, no oral  lesions.  HEART:  S1 S2.  CHEST:  There was decreased breath sounds but no wheezing or rales.  ABDOMEN:  Obese, soft, nontender.  EXTREMITIES:  There is minimal ankle edema.   His oxygen level at baseline was 94% on room air at rest with a heart  rate of 94.  After walking 3 laps of 185 feet his oxygen saturation  remained at 94% with an increase in his heart rate to 132.   IMPRESSION:  1. Severe obstructive sleep apnea currently on auto bilevel positive      airway pressure.  To further determine if he is on optimal settings      for his sleep apnea, I will arrange for him to undergo a bilevel      positive airway pressure titration study, at which time we will  also decide if he needs to continue on supplemental oxygen at night      with his bilevel positive airway pressure therapy.  Otherwise, I      have discussed with him the importance of maintaining his exercise      and diet program to try and reduce his weight.  2. Dyspnea on exertion which is likely related to his obesity and      deconditioning as well as underlying heart disease.  He does also      have a previous history of chronic obstructive pulmonary disease      but is not having much with regards to symptoms related to this and      I do not think that he would need to be started on inhaler therapy      at the present time.   I will follow up with him after I review his sleep study.     [name] Helling, MD  Electronically Signed    VS/MedQ  DD: 09/23/2006  DT: 09/24/2006  Job #: [id number]   cc:   Patrica Duel, M.D.  Dani Gobble, MD

## 2010-05-28 NOTE — Consult Note (Signed)
NAME:  LELAN, CUSH NO.:  [id number]   MEDICAL RECORD NO.:  [id number]          PATIENT TYPE:  INP   LOCATION:  1225                         FACILITY:  [facility]   PHYSICIAN:  Sharlet Salina T. Hoxworth, M.D.DATE OF BIRTH:  September 01, 1940   DATE OF CONSULTATION:  11/17/2006  DATE OF DISCHARGE:                                 CONSULTATION   SURGICAL CONSULT   CHIEF COMPLAINT:  Right upper quadrant abdominal pain.   HISTORY OF PRESENT ILLNESS:  Mr. Graybeal is a 70 year old male who 3 days  ago had the fairly rapid onset of very severe abdominal pain.  He  describes sharp, constant pain across his epigastrium radiating into  both upper quadrants.  The pain kept him up all night.  The following  day he took Tums and antacids with little relief and the pain became  more localized in his right upper quadrant.  He has not had any nausea,  vomiting.  No fever or chills.  He denies any similar severe pain.  He  yesterday presented to [facility] and was admitted.   CT scan of the abdomen and pelvis was obtained yesterday which is most  significant for inflammatory change around the gallbladder consistent  with acute cholecystitis.   Due to the number of comorbidities listed below, transfer was requested,  and the patient is now seen at [facility]. admitted by  [facility] team C.   The patient does have some chronic gas and dyspepsia for which he  takes Tums.  He thinks he has had an upper endoscopy but not sure when.  He has not had any similar severe pain.  Again, no nausea, vomiting.  Bowel movements normal.  No fever, chills or jaundice noted.   PAST MEDICAL HISTORY:  1. 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  2. He has had nasal surgery.  3. Two weeks ago he underwent procedure for BPH and urinary retention      and had a urethral injury and now has an Foley catheter.  4.  Medically he is followed for obesity.  5. Sleep apnea.  6. Coronary artery disease.  7. Gout.  8. COPD.  9. Adult onset diabetes mellitus.  10.Hypertension.  11.Atrial fibrillation.   MEDICATIONS AT THE TIME OF TRANSFER ARE:  1. Tiazac 240 mg daily.  2. Lovenox 40 subcutaneously daily.  3. Insulin 9 units subcu t.i.d.  4. Lantus 15 units subcu at bedtime.  5. Avapro 75 mg daily.  6. Protonix 40 IV q. 12.  7. Zosyn 3.375 every 6 hours.  8. Flomax 0.4 daily.  9. Albuterol nebs 2.5 every 2 hours p.r.n.  10.Dilaudid IV 1 mg q. 4 p.r.n.  11.Zofran p.r.n.  12.Phenergan p.r.n.   ALLERGIES:  1. KEFLEX.  2. SEPTRA.   SOCIAL HISTORY:  The patient is married and accompanied by his wife.  He  is retired.  No cigarette or alcohol use.   FAMILY HISTORY:  Noncontributory.   REVIEW OF SYSTEMS:  GENERAL:  No fever, chills.  He feels weak since his  surgery about 2 weeks ago.  HEENT:  No vision, hearing, or swallowing  problems.  RESPIRATORY:  Denies current shortness of breath or cough.  He chronically get short of breath with about half a flight of stairs.  CARDIAC:  Denies chest pain or recent palpitations.  He had a  catheterization in August 2007 with some mild to moderate coronary  artery disease.  No intervention.  Ejection fraction was 40%.  ABDOMEN/GI:  As above.  GU:  History of urinary retention, currently  indwelling Foley postsurgery.  MUSCULOSKELETAL:  Positive for chronic  joint pain and some difficulty with mobility.   PHYSICAL EXAM:  He is afebrile, heart rate is 108 and irregular,  respirations 26, blood pressure 123/64, O2 sats 96% on 3 liters.  IN GENERAL:  He is an obese, alert white male who appears somewhat  uncomfortable.  SKIN:  Warm and dry.  No rash or infection.  HEENT:  No palpable mass or thyromegaly.  Sclerae nonicteric.  Oropharynx clear.  LYMPH NODES:  No cervical, subclavicular or inguinal nodes palpable.  LUNGS:  No increased work of breathing.   Breath sounds are clear with  diminished breath sounds at the bases.  CARDIAC:  Mild tachycardia, irregular rhythm, no discernible murmurs.  No JVD or edema.  Peripheral pulses intact.  ABDOMEN:  Obese.  There is a small reducible umbilical hernia.  There is  well-localized, marked right upper quadrant tenderness with guarding.  No palpable masses or hepatosplenomegaly.  EXTREMITIES:  No joint swelling, deformity.  NEUROLOGIC:  Alert, oriented.  Motor and sensory exams grossly normal.   LABORATORY:  White count elevated at 23.8, hemoglobin 12.7, platelets  335.  Electrolytes:  BUN and creatinine are  normal.  Glucose 260.  LFTs  all normal except for alkaline phosphatase of 119, albumin of 2.7,  amylase and lipase were normal.   CT scan of the abdomen and pelvis personally reviewed.  This shows  inflammatory change around the gallbladder consistent with acute  cholecystitis.  Also noted are several liver lesions felt most  consistent with hemangiomas.  There is a fairly large hiatal hernia  containing a portion of the stomach.  Diverticulosis without  diverticulitis is noted.   ASSESSMENT AND PLAN:  A 70 year old male with multiple comorbidities  including chronic obstructive pulmonary disease, sleep apnea, diabetes,  obesity, atrial fibrillation, who presents with acute severe epigastric  and right upper quadrant pain and tenderness, elevated white count, and  CT scan findings all consistent with acute cholecystitis.  I have  discussed options for treatment with the patient and his wife. These  would include cholecystectomy, hopefully which could be done  laparoscopically, versus percutaneous drainage.  Although he has  increased operative risk, I do not believe his risk is prohibitive and  that his best course would be to proceed with cholecystectomy.  The  nature of the procedure and risks have been discussed with the patient  and the fact that he may require temporary ventilator  support  postoperatively.      Lorne Skeens. Hoxworth, M.D.  Electronically Signed     BTH/MEDQ  D:  11/17/2006  T:  11/18/2006  Job:  [id number]

## 2010-05-28 NOTE — H&P (Signed)
NAME:  [name], [name] NO.:  [id number]   MEDICAL RECORD NO.:  [id number]          PATIENT TYPE:  INP   LOCATION:  A310                          FACILITY:  [facility]   PHYSICIAN:  Thomasenia Bottoms, MDDATE OF BIRTH:  [birth year]   DATE OF ADMISSION:  11/16/2006  DATE OF DISCHARGE:  LH                              HISTORY & PHYSICAL   PRIMARY CARE PHYSICIAN:  Patrica Duel, M.D.   CHIEF COMPLAINT:  Abdominal pain.   HISTORY OF PRESENTING ILLNESS:  This is a 70 year old who actually  presented to his primary care physician's office today with severe right  upper quadrant abdominal pain.  The patient has had pain now for  approximately 48 hours.  After being evaluated in the primary care  physician's office, he was sent to the emergency department.  The  patient denies any nausea, vomiting, or diarrhea.  He has had similar  severe right upper quadrant pain like this in the past, but it always  resolved on its own, never continued like this.  The patient has also  been having subjective fevers and chills.  He also has a history of  pretty severe acid reflux despite taking Nexium.  The patient has had  gallbladder evaluations the past, per his wife, but they did not show  stones, she tells me.   PAST MEDICAL HISTORY:  1. Is extensive.  Per his wife's report, he was recently hospitalized      for approximately 3 weeks and nearly died almost 3 times, she said.  2. The patient was hospitalized early in October 2008 for urinary      retention.  This was complicated by urethral perforation.  3. The patient has a history of severe obstructive sleep apnea and      wears BiPAP every night, with 4 liters O2 nasal cannula, and he      does wear BiPAP instead of CPAP.  4. He has a history of mild pulmonary hypertension.  5. COPD.  6. Diabetes mellitus.  7. Hypertension.  8. Gout.  9. He had a cardiac catheterization in August 2008 which revealed      normal LV function and  only one 40% proximal OA lesion.  No      intervention was required at the time of that catheterization.  10.He has a history of GERD.  11.History of atrial fibrillation.  He has been on Coumadin, but it      was held because of episodes of severe hematuria.  12.The patient apparently was intubated during his hospital stay in      August, though I do not have full details on that.   MEDICATIONS ON ARRIVAL:  Per is current list in the ER, include:  1. Lantus 15 units subcutaneously at bedtime.  2. Avodart 0.5 mg daily.  3. Allopurinol 300 mg daily.  4. Zoloft 50 mg daily.  5. Tiazac 240 mg daily.  6. Zocor 20 mg p.o. daily.  7. Flomax 0.4 mg p.o. daily.  8. Nexium 40 mg daily.  9. Amaryl 2 mg daily.  10.Trimpex, antibiotic, 100 mg  p.o. twice a day.  11.Diovan 40 mg p.o. daily.   SOCIAL HISTORY:  The patient does not smoke cigarettes, drink alcohol,  or use any illicit drugs.   FAMILY HISTORY:  Significant for multiple family members with diabetes.   REVIEW OF SYSTEMS:  The patient is feeling quite ill, and his wife  answers all the questions.  He does not answer many questions himself.  He has received some pain medication as well.  Please see HPI for the  review of systems that she was able to answer.   PHYSICAL EXAMINATION:  VITAL SIGNS:  In the emergency department, his  temperature was 96.8, blood pressure 102/72, heart rate 98, respiratory  rate 20, O2 saturations 96% on 2 liters nasal cannula.  GENERAL:  The patient is an obese gentleman, in no acute distress.  He  is pale.  HEENT:  Normocephalic, atraumatic.  His pupils are round, his sclerae  nonicteric.  Oral mucosa quite dry.  NECK:  Supple.  No lymphadenopathy,  no thyromegaly.  Unable to appreciate jugular venous distention  because  of his neck anatomy.  CARDIAC:  Regular.  Slightly tachycardiac at the time of my evaluation.  LUNGS:  Reveal diminished breath sounds bilaterally, but he has no  wheezes, rhonchi, or  rales at this time.  ABDOMEN:  Obese.  He has right upper quadrant tenderness to deep  palpation, some mild tenderness in the epigastric area as well.  EXTREMITIES:  Reveal no evidence of clubbing, cyanosis, or pitting  edema.  He has palpable DP pulses bilaterally.  SKIN:  Warm and dry.  NEUROLOGIC:  The patient seems to be somewhat hard of hearing, but he we  will answer questions, and he does answer them appropriately.  He has no  slurred speech.  His cranial nerves II-XII are intact grossly.  He rolls  from one side of the bed to the other on command and has no clear focal  motor deficit.  He moves each of his extremities purposefully.  His  Babinski reflexes are equivocal.   DATA:  His white count is 23.8, hemoglobin 12.7, hematocrit 38.4,  platelet count is 355.  Sodium is 135, potassium 4.1, chloride 97,  glucose 260, BUN 14, creatinine 1.04, total protein 7, AST is 26, ALT is  31.  Urinalysis reveals amber urine, with a high specific gravity, his  pH is normal at 5.  He has large hemoglobin, trace ketones, positive  nitrites, moderate leukocyte esterase, too numerous to count WBCs, too  numerous to count RBCs, many bacteria, and some granular casts.  CT scan  of the patient's abdomen reveals inflammatory stranding around the  gallbladder, suggesting cholecystitis.  Scattered lesions within the  liver are suggestive of hepatic hemangioma, though not diagnostic.  Sonographic or CT followup recommended.  He has a large hiatal hernia,  with portion of the stomach in the left hemithorax and adjacent  atelectasis and trace pleural effusion, right lower lobe atelectasis  which is unchanged from previous, stable bilateral renal cysts,  scattered small lymph nodes at the root of the mesenteric which are  probably reactive, sigmoid diverticulosis without diverticulitis in the  umbilical hernia, containing adipose tissue.   ASSESSMENT AND PLAN:  1. Acute cholecystitis.  The patient is  going to be admitted to a      medical bed with surgical consultation.  We will put the patient on      Zosyn.  I am putting him on broader-spectrum antibiotics because he  is also diabetic.  2. Diabetes mellitus.  Will hold his p.o. Amaryl, but will continue a      small dose of Lantus and follow his sugars carefully.  3. Recent urinary retention and indwelling Foley catheter.  The      catheter apparently was scheduled to be taken out tomorrow.  He has      an appointment with a urologist, but we will likely leave that in      while he is hospitalized.  4. Known severe obstructive sleep apnea and chronic obstructive      pulmonary disease.  We will continue the patient's nightly BiPAP      and 4 liters O2 nasal cannula while he is hospitalized.  5. For his history of atrial fibrillation, will monitor his heart      rate.  The patient has been on metoprolol in the past, but I do not      see that currently on his list, so we will just keep an eye on him.      Thomasenia Bottoms, MD  Electronically Signed     CVC/MEDQ  D:  11/17/2006  T:  11/17/2006  Job:  [id number]   cc:   Patrica Duel, M.D.  Fax: [phone]   Lucrezia Starch. Earlene Plater, M.D.  Fax: [phone]

## 2010-05-28 NOTE — H&P (Signed)
NAME:  [name] [name], [name] [name] NO.:  [id number]   MEDICAL RECORD NO.:  [id number]          PATIENT TYPE:  INP   LOCATION:  1225                         FACILITY:  [facility]   PHYSICIAN:  Michaelyn Barter, M.D. DATE OF BIRTH:  11-13-1940   DATE OF ADMISSION:  11/17/2006  DATE OF DISCHARGE:                              HISTORY & PHYSICAL   PRIMARY CARE DOCTOR:  Unassigned.   CHIEF COMPLAINT:  Right-sided abdominal pain.   HISTORY OF PRESENT ILLNESS:  Mr. Schoenfelder is a 70 year old gentleman with a  past medical history of atrial fibrillation, COPD, diabetes mellitus,  hypertension, obstructive sleep apnea who indicates that he developed  abdominal pain this past Saturday.  He states that his abdominal pain  initially was generalized; however, it became localized later to the  right upper quadrant.  He denies having any nausea or vomiting.  He has  not experienced any fevers, but does complain of occasional chills.  He  states that his pain reached a peak of approximately 5/5 in intensity.  He presented to [facility] yesterday for further evaluation,  and there work-up included that he was suffering from an acute episode  of acute cholecystitis.  The family requested that the patient be  transferred from [facility] to [facility] secondary  to his history of multiple comorbidities so that he could have closer  observation.   PAST MEDICAL HISTORY:  1. Mild pulmonary hypertension.  2. The patient had a cardiac catheterization completed recently on      August 25, 2006 by Dr. Ritta Slot.  His impression was that the      patient had normal left main coronary artery, left anterior      descending artery without evidence of significant stenosis.  The      left circumflex showed a mild 40% lesion in the proximal OM, which      was not deemed to be significant.  His ejection fraction was      normal, and his left ventricular systolic function was deemed  to be      normal.  He was also noted to have had mild pulmonary hypertension      with a PA pressure of 41/16 with a mean of 30.  3. Double pneumonia, which was treated approximately 2-3 weeks ago.      The wife states that the patient's clinical course was tenuous      secondary to this.  4. The patient experienced a recent urethral perforation secondary to      having an intra-abdominal catheter placed.  The wife states that      the patient's clinical course became very serious after this      occurred recently in October of 2008.  She indicates that the      patient almost died secondary to complications from this.  5. History of COPD.  6. History of diabetes mellitus.  7. Hypertension.  8. Gout.  9. GERD.  10.Chronic atrial fibrillation.  11.Respiratory failure requiring intubation in August of 2008.  12.Obstructive sleep apnea, for which the patient  wears BiPAP at night      time.  13.Gout.  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  15.Morbid obesity.  16.Multiple episodes of pneumonia.  17.The patient had a pulmonary function test completed July 02, 2006.      The spirometry showed moderate ventilatory defect with evidence of      airflow obstruction, but also with typical appearance of a      restrictive change.  An arterial blood gas showed severe hypoxemia      and an elevated pCO2 suggestive chronic hypoxic respiratory      failure.   PAST SURGICAL HISTORY:  1. Transurethral resection of the prostate completed October 30, 2006      by Dr. Gaynelle Arabian.  2. Cystourethroscopy, cystogram, transurethral Foley catheter      placement completed November 01, 2006 by Dr. Lynelle Smoke I. Tannenbaum      secondary to urethral perforation with intra-abdominal catheter      placement.   CURRENT MEDICATIONS:  1. Lantus insulin 15 units subcutaneous q.h.s.  2. Avodart 0.5 mg p.o. daily.  3. Allopurinol 300 mg p.o. daily.  4. Zoloft 50 mg  p.o. daily.  5. Tiazac 240 mg daily.  6. Zocor 20 mg p.o. daily.  7. Flomax 0.4 mg p.o. daily.  8. Nexium 40 mg daily.  9. Amaryl 2 mg daily.  10.Trimpex antibiotic 100 mg p.o. b.i.d.  11.Diovan 40 mg daily.   SOCIAL HISTORY:  Cigarettes and alcohol - the patient denies.   FAMILY HISTORY:  Positive for diabetes.   REVIEW OF SYSTEMS:  As per HPI; otherwise all other systems are  negative.   PHYSICAL EXAMINATION:  GENERAL:  The patient is awake.  He is  cooperative.  He is no obvious distress.  There are no obvious signs of  respiratory compromise.  VITAL SIGNS:  Temperature is 98.8, heart rate 108, blood pressure  123/64, respirations 26, O2 saturation 96% on 3 liters of oxygen.  HEENT:  Normocephalic and atraumatic.  Anicteric.  Extraocular movements  are intact.  Oral mucosa is moist.  No thrush.  No exudates.  NECK:  No JVD.  Supple.  No lymphadenopathy, no thyromegaly.  CARDIAC:  Distant heart sounds.  All the sounds are irregular.  RESPIRATORY:  No crackles or wheezes.  ABDOMEN:  There is some slight distention of the patient's abdomen.  There is positive right upper quadrant tenderness to palpation.  No  rebound, no guarding.  No masses palpated.  Bowel sounds are present x3.  EXTREMITIES:  No leg edema.  NEUROLOGIC:  The patient is alert and oriented x3.  MUSCULOSKELETAL:  There is 5/5 upper and lower extremity strength.   LABORATORY DATA:  The patient's lipase is 13.  Amylase 12.  A  comprehensive metabolic profile from November 16, 2006 revealed sodium of  135, potassium 4.1, chloride 97, CO2 of 30, glucose 260, BUN 14,  creatinine 1.04.  Bilirubin total 0.6, alkaline phosphatase 119, SGOT  26, SGPT 31, total protein 7.0, albumin 2.7, calcium 8.8.   ASSESSMENT AND PLAN:  1. Acute cholecystitis.  Dr. [name], the general surgeon, has been      consulted.  I have spoken with Dr. [name], and he indicated that      he plans to take the patient to the operating room  today, during      which time he will perform a laparoscopic cholecystectomy.  Will      start the  patient on empiric IV Zosyn.  Will provide p.r.n. pain      medications.  2. History of atrial fibrillation.  Will resume the patient's      previously-prescribed medications, excluding Coumadin which will be      placed on hold until after the patient's surgery.  3. History of chronic obstructive pulmonary disease/obstructive sleep      apnea.  The patient is BiPAP dependent at night time.  Therefore,      he is at increased risk of being on the mechanical ventilator      following his surgical procedure for a longer period of time.  Will      monitor the patient's respiratory status, and will consider      consultation with pulmonary.  The patient is breathing without any      obvious distress, and he actually looks comfortable with regards to      his respirations.  4. Diabetes mellitus.  In light of the patient's surgery being      scheduled sometime today, will hold the patient's diabetic      medications for now.  Will implement Accu-Cheks, as well as sliding      scale insulin in order to prevent any danger of hypoglycemic      episodes from occurring, as the patient currently has been made      NPO.  5. Hypertension.  This is currently stable.  Will monitor closely.  6. GI prophylaxis.  Will provide Protonix.  7. Deep vein thrombosis prophylaxis.  Will provide SCDs.      Michaelyn Barter, M.D.  Electronically Signed     OR/MEDQ  D:  11/17/2006  T:  11/17/2006  Job:  [id number]

## 2010-05-28 NOTE — Discharge Summary (Signed)
NAME:  [name] [name], REASON NO.:  [id number]   MEDICAL RECORD NO.:  [id number]          PATIENT TYPE:  INP   LOCATION:  A203                          FACILITY:  [facility]   PHYSICIAN:  Dorris Singh, DO    DATE OF BIRTH:  07/09/1940   DATE OF ADMISSION:  11/16/2006  DATE OF DISCHARGE:  11/04/2008LH                               DISCHARGE SUMMARY   ADMISSION DIAGNOSES:  1. Acute cholecystitis.  2. Diabetes.  3. Gout.  4. Hypertension.  5. Chronic obstructive pulmonary disease.  6. Atrial fibrillation.   DISCHARGE DIAGNOSES:  1. Acute cholecystitis.  2. Diabetes.  3. Gout.  4. Chronic obstructive pulmonary disease.  5. Hypertension.  6. Atrial fibrillation.   CONSULTATIONS THAT WERE MADE:  Barbaraann Barthel, M.D., of surgery.   TESTS THAT WERE DONE:  An abdominal CT done on November 3 with  inflammation and stranding around gallbladder with cholecystitis.   HOSPITAL COURSE:  Please refer to his H&P done by Dr. [name], but to  summarize the patient is a 70 year old Caucasian male who presented to  the emergency room at [facility] with a complaint of upper right  quadrant pain.  Pain was rated at 7/10.  He had multiple episodes and  the pattern was intermittent, and it was noted to be stabbing and sharp.  Testing confirmed that the patient had acute cholecystitis.  The patient  was just recently treated at [facility] for urinary retention and had a  complication due to a ureter perforation and was discharged.  The family  insisted that the patient be transferred to [facility] due to his higher  level of care that is required for him if he did have to go through  surgery.  Dr. [name] saw the patient and agreed that he was high risk  for surgery here at the facility and that he may end up with possible  ventilator dependence and recommended a transfer to [facility] or to [facility]  as well.  [facility] was contacted and the surgeon on call, which was Dr.  [name], was  contacted, who agreed to accept the patient to his service  and perform the surgery.  Then the [facility] P at [facility] was  consulted and asked to accept the patient, and therefore the patient was  transferred over to [facility].   His current medications that he is on include:  1. Tiazac 240 mg p.o. daily.  2. Lovenox 40 mg subcu daily.  3. Insulin 9 units subcu t.i.d.  4. Lantus 15 units subcu q.h.s.  5. Avapro 75 mg p.o. daily.  6. Protonix 40 mg IV q.12h.  7. Zosyn 3.375 g every 6 hours.  8. Normal saline IV going at 75 mL/hr.  9. Flomax 0.4 mg p.o. daily.  10.Albuterol nebulizers 2.5 mg q.2h. p.r.n.  11.Dulcolax suppository 10 mg p.r. p.r.n.  12.Dilaudid 1 mg IV q.4h. p.r.n.  13.Zofran 4 mg IV q.8h. p.r.n.  14.Phenergan 12.5 mg IV q.4h.   CONDITION:  Stable.   DISPOSITION:  To [name].  He will be transferred and his care will  be continued there.      Dorris Singh, DO  Electronically Signed     CB/MEDQ  D:  11/17/2006  T:  11/17/2006  Job:  [id number]

## 2010-05-28 NOTE — Procedures (Signed)
NAME:  [name] [name], KRETSCHMER NO.:  [id number]   MEDICAL RECORD NO.:  [id number]          PATIENT TYPE:  REC   LOCATION:  RAD                           FACILITY:  [facility]   PHYSICIAN:  Dani Gobble, MD       DATE OF BIRTH:  05-09-1940   DATE OF PROCEDURE:  06/18/2006  DATE OF DISCHARGE:                                ECHOCARDIOGRAM   REFERRING PHYSICIAN:  Dani Gobble, M.D.   INDICATIONS:  A 70 year old gentleman with a past medical history of  hypertension, diabetes who is referred for shortness of breath and a  cardiac murmur.   The technical quality of the study is limited secondary to patient body  habitus, poor acoustic windows.   The aorta measures normally at 3.1-cm.   The left atrium is mildly dilated, measured at 4.5-cm.  The patient  appeared to be in sinus rhythm during this procedure.   Interventricular septum is moderately thickened, measured at 1.8-cm.  Visually, it looks to be more like 1.5 to 1.6-cm.  The posterior wall is  mildly thickened.   The aortic valve is not well visualized but the leaflets do appear to  exhibit reasonable opening.  They may be just mildly thickened.  No  significant aortic insufficiency is noted.  Doppler interrogation of the  aortic valve reveals a peak velocity of 1.7 meters per second  corresponding to a peak gradient of 11-mmHg and a mean gradient of 7-  mmHg.   The mitral valve appears grossly structurally normal.  Mild mitral  regurgitation is noted skirting along the posterior wall.  Doppler  interrogation of mitral valve is within normal limits.   The pulmonic valve appears grossly structurally normal.   The tricuspid valve also appears grossly structurally normal, although  not well visualized.  Mild tricuspid regurgitation is noted.   The left ventricle is normal in size, with the LV IDD measured at 5.2-  cm, and the LV ISD measured at 3.5-cm.  Overall, left ventricular  systolic function is vigorous to  hyperdynamic.  No regional wall motion  abnormalities noted.   The right atrium and right ventricle appear normal in size.  The right  ventricular systolic function appears to be normal.   I cannot exclude the possibility of a small posterior pericardial  effusion without evidence of hemodynamic compromise.   IMPRESSION:  1. Mild left atrial enlargement.  2. Mild to moderate concentric left ventricular hypertrophy.  3. Mild aortic sclerosis without stenosis.  4. Mild mitral and tricuspid regurgitation.  5. Normal left size with vigorous to hyperdynamic left ventricular      systolic function and no regional wall motion abnormalities noted.  6. I cannot exclude the possibility of a small posterior pericardial      effusion without evidence of a hemodynamic compromise.           ______________________________  Dani Gobble, MD     AB/MEDQ  D:  06/18/2006  T:  06/18/2006  Job:  [id number]

## 2010-05-28 NOTE — Consult Note (Signed)
NAME:  [name] [name], [name] [name] NO.:  [id number]   MEDICAL RECORD NO.:  [id number]          PATIENT TYPE:  INP   LOCATION:  A203                          FACILITY:  [facility]   PHYSICIAN:  Barbaraann Barthel, M.D. DATE OF BIRTH:  Apr 01, 1940   DATE OF CONSULTATION:  11/17/2006  DATE OF DISCHARGE:                                 CONSULTATION   Surgery was asked to see this 70 year old white male who came to the  emergency room with gallbladder disease.   HISTORY OF PRESENT MEDICAL ILLNESS AND ADMISSION NOTE:  This patient  began right upper quadrant pain on Saturday. This was accompanied with  nausea; however, no vomiting, no real changes in his bowel habits or any  particular changes other than right upper quadrant pain and anorexia.  He sought attention medically from Dr. Nobie Putnam on Monday, and CT scan  was ordered.  Before the results of that were known, the patient was  seen in the emergency room as he continued to have pain.  I was called  after Dr. Margretta Ditty had called [facility] where he thought this  patient should be sent because of his multiple medical problems and the  fact that he is currently being treated surgically for urinary problems  which I will discuss later.  [facility] did not accept him in transfer in the  evening, and then I was called and suggested that perhaps a transfer to  [facility] would be necessary with all of his medical problems. Then I did  not hear any further from Dr. Margretta Ditty until I found out through the  chief of surgery that the patient had been admitted through the  hospitalist service, and then I saw the patient and called the  hospitalist team and asked if I were indeed consulted.  I was, and then  I saw the patient first thing in the morning.   PHYSICAL EXAMINATION:  GENERAL:  Examination discloses an obese 66-year-  old white male who is not very active and appears with difficulty  breathing.  VITAL SIGNS:  He is 245 pounds.  He is 5 fee 8  inches.  His temperature  is 98.0 and blood pressure is 120/77.  His heart rate is 105 and is  irregular.  He is in atrial fibrillation. His O2 saturation is 93% on 3  liters nasal cannula.  HEENT:  Head is normocephalic.  Eyes:  Extraocular movements are intact.  Pupils were round and react to light and accommodation.  There is noted  bilateral conjunctival pallor.  The patient's nose and oral mucosa are  moist.  NECK:  Short but no cervical adenopathy.  No bruits are appreciated, and  there is no jugular vein distention or thyromegaly.  CHEST:  The patient has diminished breath sounds bilaterally. His heart  is irregular.  He is in atrial fibrillation .  He has a left thoracotomy  scar where he was treated surgically for an esophageal diverticulum.  This required two surgeries in 1979 and 1990s.  He has also had a recent  history in July 2008 of a pneumonia which was complicated  with a  pneumothorax requiring chest tube placement.  ABDOMEN:  The patient is tender in the right upper quadrant.  There is a  positive Murphy sign.  No other masses are appreciated.  The patient  states that he had previous inguinal hernia surgery.  I really do not  see the scars, and he certainly has no recurrence that I palpate. He  does have a non-incarcerated umbilical hernia approximately the size of  a 50-cent piece.  RECTAL:  The patient has guaiac-negative stools. His prostate is smooth.  GENITALIA:  He has a urinary catheter placed. This is sequela to TURP  that was performed on October 19, and apparently the patient had some  complications from this, possibly a perforated urethra. I am not sure  about the details. Dr. Earlene Plater saw him at that time. I also reviewed his  previous sonograms and studies with the radiologist, and there appears  that there is a question of a bladder mass.  At any rate, he was  currently being treated by the urology service at [facility] for these  problems.   REVIEW OF SYSTEMS:   GI SYSTEM:  The patient has right upper quadrant  pain and anorexia, some nausea, no vomiting.  Pain is the main symptom  in the right upper quadrant.  When discussed in detail, the patient  states that he may have had similar episodes in the past that he ignored  or did not think were quite as serious. He certainly has had no episodes  of serious as his current one, so essentially this is his first real  remembered episode of gallbladder problems.  The rest of GI Review of  Systems, the patient underwent a colonoscopy 3-4 years ago.  He has  diverticulosis. As stated, he has history of esophageal diverticula  which were treated surgically in 1979 and 1990s.  He has had no new  complaints of bright red rectal bleeding, black tarry stools or change  in his bowel habits.  His stool was guaiac negative on examination here,  and he has not no unexplained weight loss.  He is morbidly obese.  GU  SYSTEM:  See History of Present Medical Illness. In essence, he has  undergone a recent TURP in October complicated with possible urethra  damage, and he currently has a bladder catheter inserted and possibly a  mass in his bladder.  ENDOCRINE SYSTEM:  The patient has no history of  thyroid disease.  He is, however, an insulin-dependent diabetic.  He  also has a history of gout.  CARDIORESPIRATORY SYSTEM:  The patient has  a nonsmoker, nondrinker.  He does have a history of chronic obstructive  pulmonary disease, and he has sleep apnea. He underwent a cardiac  catheterization by [facility], and he has recently  been seen by Dr. Domingo Sep.  He was on Coumadin at one point. He is no  longer on Coumadin as he had bleeding problems from that.  He has a  history of hypertension as well as atrial fibrillation.  The patient has  a history of pneumonia and chest tube insertion.  MUSCULOSKELETAL  SYSTEM:  The patient is morbidly obese.  He has had a previous fracture  of his right lower leg.  This did not require an open reduction or  internal fixation; however, there is obvious deformity here.  He walks  with a cane and walker at home.  NEUROLOGIC:  The patient has a history  of depression.   PAST  SURGICAL HISTORY:  1. The patient has had a repair of esophageal diverticula in 1979 and      1990.  2. He had a TURP that was complicated in October 2008.  3. He had a previous prostate biopsy the 1980s.  4. Other surgery includes repair of an anal fissure.  5. Tonsillectomy in childhood.  6. Bilateral inguinal hernia surgery according to him.  7. He has also had a cardiac stent placed.  8. As stated, he underwent a colonoscopy 3-4 years ago.   ALLERGIES:  The patient is allergic to CEPHALOSPORINS, SULFA, CIPRO,  CIPROFLOXACIN, AZTREONAM.   CURRENT MEDICATIONS:  1. Diovan 40 mg daily.  2. Amaryl 2 mg daily.  3. Trimpex 100 mg b.i.d.  4. Neurontin 40 mg daily.  5. Flomax 0.4 mg daily.  6. Zocor 20 mg daily.  7. Tiazac 240 mg daily.  8. Avodart  0.5 mg daily.  9. Insulin 15 units at night.  10.Zoloft 50 mg daily.  11.Allopurinol 300 mg daily.   LABORATORY DATA:  CT scan shows some obvious thickening of the  gallbladder. The biliary radicals are not dilated.  No stones are seen  on CT scan; however, this does not mean that he does not have any  stones.   His white count is 23.8.  He has a hemoglobin of 12.7, hematocrit of  38.4, platelet count 335,000.  His liver function studies are all  grossly within normal limits.  He has a mild elevation of alkaline  phosphatase at 119.  His metabolic-7 is within normal limits.  His blood  sugar is 260 with a BUN of 14 and a creatinine of 1.04.  Amylase and  lipase are pending.   CT scan showed as well that the patient has a rather large hiatal hernia  with portions of the stomach within his left chest.  He has some  atelectasis and a left pleural effusion as well.   IMPRESSION:  Mr. Flynt is a 70 year old obese white male  with multiple  medical problems who presents with essentially, I believe, his first  documented case of gallbladder disease.  This is probably secondary to  stones.  We will obtain a sonogram to further elucidate that.  His liver  function studies are within normal limits.  He has multiple medical  problems and significant cardiovascular risks and ongoing urological  problem.  His wife feels more comfortable about him going to [facility], and if  [facility] does not wish to take him, I further  suggested that he be transferred to [facility] or [facility] due to these medical  problems which I think make him a considerable surgical risk,  particularly a looming possibility of ventilator dependency in this  patient with significant respiratory history.  I agree with his present  treatment with antibiotics and essentially keeping him n.p.o. except for  some clear liquids, and I will discuss the case further with the  hospitalists.   Other medical problems, as stated above, include type 1 diabetes  mellitus, coronary artery disease, chronic obstructive pulmonary  disease, gout, hypertension, morbid obesity, large hiatal hernia with a  history of GERD and reflux, and ongoing urological problems stemming  from a TURP and cystoscopy in October 2008.      Barbaraann Barthel, M.D.  Electronically Signed     WB/MEDQ  D:  11/17/2006  T:  11/17/2006  Job:  [id number]   cc:   Rhae Lerner. Margretta Ditty, M.D.  [address]  [place]  [place] [address]   Patrica Duel, M.D.  Fax: [phone]   Lucrezia Starch. Earlene Plater, M.D.  Fax: [phone]   Dani Gobble, MD  Fax: [phone]

## 2010-05-28 NOTE — Discharge Summary (Signed)
NAME:  [name] [name], [name] [name] NO.:  [id number]   MEDICAL RECORD NO.:  [id number]          PATIENT TYPE:  INP   LOCATION:  4715                         FACILITY:  [facility]   PHYSICIAN:  Leslye Peer, MD    DATE OF BIRTH:  [birth year]   DATE OF ADMISSION:  03/06/2007  DATE OF DISCHARGE:  03/16/2007                               DISCHARGE SUMMARY   FINAL DIAGNOSES:  1. Acute-on-chronic respiratory failure secondary to diastolic      congestive heart failure, atrial fibrillation, and volume overload,      further complicated by chronic obstructive pulmonary disease,      obstructive sleep apnea, and recurrent right effusion.  2. Chronic atrial fibrillation.  3. Diabetes.  4. Debility.   PROCEDURE:  Right thoracentesis on March 12, 2007.   LABORATORY DATA:  March 16, 2007:  Sodium 139, potassium 3.5, chloride  94, CO2 36, glucose 193, BUN 20, creatinine 0.86.  PT/INR is 17.3/1.4.  Magnesium on March 14, 2007, is 2.0.  On March 14, 2007, white blood cell  count 11.5, hemoglobin 12.7, hematocrit 38.7, platelet count 178.   RADIOLOGY:  Chest x-ray obtained on March 16, 2007, demonstrates chronic  left lower lobe atelectasis, effusion.  Mild recurrence of right-sided  effusion with atelectasis.   BRIEF HISTORY:  This is a 70 year old male patient with a history of  obstructive sleep apnea and obesity hypoventilation syndrome with  resultant chronic respiratory failure, further complicated by coronary  artery disease, secondary pulmonary artery hypertension, chronic atrial  fibrillation and hypertension as well as questionable airflow  limitations by prior PFTs.  He presented to the emergency room with  slowly progressive dyspnea and hypoxia and increased fatigue.  He was  admitted secondary to progressive respiratory failure.   HOSPITAL COURSE BY DISCHARGE DIAGNOSES:  1. Acute-on-chronic respiratory failure secondary to multifactorial      disease processes, including  diastolic congestive heart failure,      atrial fibrillation, volume overload, and right and left heart      dysfunction.  Further complicated by underlying chronic obstructive      pulmonary disease, obesity hypoventilation syndrome and obstructive      sleep apnea, further complicated by recurrent right effusion as      well as  chronic left atelectasis.  [name] [name] was admitted to the      step-down status.  Therapy consisted initially of noninvasive      positive pressure ventilation, aggressive IV diuresis, and rate      control in regards to atrial fibrillation.  He continued to improve      over the course of his hospitalization.  He eventually underwent      ultrasound-guided thoracentesis on the right side with removal of      1200 mL of pleural fluid.  Evaluation was nondiagnostic.  The      pleural drainage was exudative by characteristics, however, thought      to be secondary to large-volume diuresis.  Pathology was negative,      no culture data was available.  Ultimately [name] [name] major  therapeutic therapies included aggressive diuresis and rate control      for atrial fibrillation as well as blood pressure control in      concert with supportive noninvasive positive pressure ventilation.      Upon time of discharge, [name] [name] has been aggressively diuresed.      He will be discharged to home on continuous supplemental oxygen,      nighttime BiPAP, and twice daily diuresis.  He will require      outpatient followup chest x-ray.  2. Chronic atrial fibrillation.  Plan for this is to continue rate      control medications with Lopressor and Cardizem.  Additionally he      will be placed back on anticoagulation.  This will include a      regimen consisting of low-molecular-weight heparin bridging to      Coumadin.  He had been prescribed a 10-day course of therapy for      Lovenox.  His Coumadin will be managed at Dr. Sampson Si      clinic with eventual goal of 2  to 3 in the INR range.  3. Hypertension.  This will be managed with a regimen to be described.  4. Diabetes type 2.  The plan for this is diet control and oral      hypoglycemic agents.  5. Debility.  [name] [name] was evaluated by occupational therapy.  It was      felt for him to have maximum independence at home he would require      a rolling walker, and this has been prescribed.   Upon time of discharge [name] [name] has reached maximum benefit from  inpatient care.  Of concern, he does appear to have mild recurrence in  right effusion; however, this is most likely secondary to underlying  cardiac dysfunction.  Plan will be followup in the outpatient setting  including laboratory data for blood chemistry as well as followup chest  x-ray.   DISCHARGE MEDICATIONS:  1. Glimepiride 2 mg p.o. daily.  2. Nexium 40 p.o. daily.  3. Simvastatin 20 p.o. daily.  4. Sertraline 100 mg p.o. daily.  5. Diltiazem ER 240 daily.  6. Avodart 0.5 mg daily.  7. Allopurinol 300 mg daily.  8. Metoprolol 25 mg b.i.d.  9. Furosemide 40 mg twice daily x 5 more days, then back to once      daily.  10.Potassium chloride 20 mEq daily.  11.Avapro 300 mg tab daily.  12.Coumadin 5 mg tab, instructed to take 1-1/2 tablets on the night of      the 3rd and 4th, then further dosing per Dr. Roque Lias clinic.  13.Lovenox 110 mg injection twice daily until instructed to      discontinue.   Followup will be as follows:  Dr. Janith Lima on Wednesday March 18,  Coralyn Helling on March 16, Tammy Parrett on March 9th,  at which time he  will have a chest x-ray, and lab work done to follow up renal function  in the pulmonary clinic.  Finally, he has been instructed to go Dr.  Roque Lias office this coming Thursday for further evaluation of INR and  for further Coumadin dosing.   DISCHARGE DIET:  Low sodium, low calorie.   DISPOSITION:  Again, [name] [name] has met maximum benefit from inpatient  stay.  He will be discharged  to home under the care of his wife.      Zenia Resides, NP  Leslye Peer, MD  Electronically Signed    PB/MEDQ  D:  03/16/2007  T:  03/16/2007  Job:  [id number]   cc:   Dr. Janith Lima  Coralyn Helling, MD

## 2010-05-28 NOTE — Op Note (Signed)
NAME:  [name] [name], [name] [name] NO.:  [id number]   MEDICAL RECORD NO.:  [id number]          PATIENT TYPE:  INP   LOCATION:  1225                         FACILITY:  [facility]   PHYSICIAN:  Sharlet Salina T. Hoxworth, M.D.DATE OF BIRTH:  January 11, 1941   DATE OF PROCEDURE:  11/17/2006  DATE OF DISCHARGE:                               OPERATIVE REPORT   PREOPERATIVE DIAGNOSIS:  Acute cholecystitis.   POSTOPERATIVE DIAGNOSIS:  Acute gangrenous cholecystitis and  cholelithiasis.   SURGICAL PROCEDURES:  Laparoscopy, open cholecystectomy with  intraoperative cholangiogram.   SURGEON:  Sharlet Salina T. Hoxworth, M.D.   ASSISTANT:  Currie Paris, M.D.   ANESTHESIA:  General.   BRIEF HISTORY:  Mr. Mears is a 70 year old male with multiple medical  problems who presented with three days of acute severe epigastric and  right upper quadrant abdominal pain.  He had a markedly elevated white  count, marked tenderness in the right upper quadrant and CT scan showing  significant inflammatory change around the gallbladder consistent with  acute cholecystitis.  I have recommended proceeding with laparoscopic  cholecystectomy with cholangiogram.  The nature of procedure,  indications, risks of bleeding, infection, cardiorespiratory  complications, postop ventilator dependence and possible need for open  procedure were discussed with the patient and his wife preoperatively.  He is now brought to the operating room for this procedure.   DESCRIPTION OF OPERATION:  The patient was brought to the operating room  and placed in supine position on the table and general orotracheal  anesthesia was induced.  The abdomen was widely sterilely prepped and  draped.  He is on broad-spectrum preoperative antibiotics.  PAS were  placed.  Correct patient and procedure were verified.  Access was  obtained in the epigastrium due to obesity with 1 cm incision open  Hasson technique through mattress suture of 0 Vicryl  and  pneumoperitoneum established.  Under direct vision a 10 mm trocar was  placed in the subxiphoid area and two 5 mm trocars on the right  subcostal margin.  There were inflammatory adhesions of the omentum to  the gallbladder.  These were carefully taken down with blunt dissection,  the gallbladder was seen to be tensely distended severely acutely  inflamed with patchy gangrene.  The gallbladder was decompressed with  aspiration needle and the fundus was able to be grasped and elevated.  There was marked inflammation extending down to the infundibulum and  Calot's triangle.  The infundibulum was grasped, retracted laterally  although the gallbladder was very edematous and stiff and retraction was  difficult due to the friability of the tissue.  Through a 5 mm port, an  additional retractor was used to retract the hepatoduodenal ligament to  the left.  Careful blunt dissection was then used along Calot's triangle  and distal gallbladder in effort to discern the anatomy but gallbladder  could not be adequately grasped due to friability of the tissue which  just tended to break away and we really could not get adequate exposure  due to this.  I elected to convert to open procedure.  A subcostal  Kocher  incision was used and dissection carried down through  subcutaneous tissue, fascial and muscle layers using cautery.  The  peritoneum entered under direct vision.  The colon and duodenum were  packed away and gallbladder exposed.  I then took the gallbladder down  retrograde incising the peritoneum and dissecting dome of the  gallbladder down off the liver.  The posterior wall of the gallbladder  was mostly gangrenous.  I was able to develop a blunt plane of  dissection between the gallbladder and liver and the thickened, inflamed  peritoneum was incised with cautery, working down toward the  infundibulum.  Then with mostly blunt dissection,retracting the  gallbladder laterally, the  dissection was carried down to Calot's  triangle.  The cystic artery was clearly seen coursing to the  gallbladder and was divided between two proximal clips.  Further  dissection with a Kitner completed dissection down to the cystic duct  gallbladder junction.  The cystic duct was dissected free over about a  centimeter.  There was still significant inflammation in this area.  Following this, an operative cholangiogram was obtained with taut  catheter through the cystic duct which showed good filling of a normal-  sized common bile duct and intrahepatic ducts with free flow into the  duodenum.  There were several large stones in the gallbladder that were  removed.  Cholangiocath was removed and the cystic duct was triply  clipped proximally and divided.  The right upper quadrant was thoroughly  irrigated.  There was really no significant bleeding from gallbladder  bed.  A Surgicel pack was placed here.  A closed suction 19 Blake drain  was placed in the gallbladder bed and Morison's pouch and brought out  through a lateral stab wound.  The wound was then closed in layers with  running 0 PDS.  Subcu was irrigated.  Skin closed with staples.  Sponge,  needle and instrument counts were correct.  Dry sterile dressings were  applied.  The patient taken to ICU intubated in stable condition.      Lorne Skeens. Hoxworth, M.D.  Electronically Signed     BTH/MEDQ  D:  11/17/2006  T:  11/18/2006  Job:  [id number]

## 2010-05-28 NOTE — Discharge Summary (Signed)
NAME:  [name] [name], [name] [name] NO.:  [id number]   MEDICAL RECORD NO.:  [id number]          PATIENT TYPE:  INP   LOCATION:  2008                         FACILITY:  [facility]   PHYSICIAN:  Madaline Savage, M.D.DATE OF BIRTH:  1940/11/17   DATE OF ADMISSION:  08/06/2006  DATE OF DISCHARGE:  08/30/2006                               DISCHARGE SUMMARY   DISCHARGE DIAGNOSES:  1. Progressive dyspnea.  2. Paroxysmal nocturnal dyspnea, also progressive.  3. Respiratory failure requiring intubation during this admission -      resolved.  4. Chronic obstructive pulmonary disease with atelectasis.  5. Atrial fibrillation on Coumadin anticoagulation therapy.  6. Known obstructive sleep apnea on biphasic positive airway pressure.  7. Hypertension.  8. Diabetes mellitus.  9. Gout.  10.Coronary artery disease status post coronary angiography during      this admission without any percutaneous coronary intervention.  11.Benign prostatic hypertrophy with chronic urinary retention and      hematuria status post indwelling Foley catheter.  He has to follow      up with urologist, Darvin Neighbours.  12.Status post community-acquired pneumonia during this admission.  13.Morbid obesity.   This is a 70 year old Caucasian gentleman who has multiple comorbidities  who presented to our office at [facility], was seen by Dr. Domingo Sep with  complaints of chest discomfort, worsening shortness of breath even with  minimal exertion, and worsening paroxysmal nocturnal dyspnea.  Because  of his complaints and risk factors, Dr. Domingo Sep felt like this patient  would need to be admitted to the hospital, cleared from respiratory  status, and undergo coronary angiography for a definitive diagnosis.   The patient was admitted to [facility] and  shortly after was transferred to the CCU because of progressive  respiratory failure requiring intubation.  During that period, his x-ray  showed  atelectasis and infiltrates compatible with pattern of pneumonia.  The patient was started on antibiotic therapy.  The ABG showed  respiratory acidosis respiratory acidosis, and he also had an upper  respiratory obstruction due to obstructive sleep apnea and after  intubation required BiPAP therapy.   He developed atrial fibrillation with rapid ventricular response and was  treated with IV Cardizem, but unfortunately, we were unable to start him  on a full dose of Lovenox because of persistent gross hematuria, and the  patient was kept on prophylactic dose of Lovenox.   His respiratory status gradually improved, and on August 25, 2006, he  underwent coronary angiography performed by Dr. Lynnea Ferrier.  His cath  revealed mild pulmonary hypertension with pulmonary artery pressure of  41/16 with a mean of 30 and mild atherosclerosis but no significant  coronary artery disease.  He had a normal ejection fraction, normal left  ventricular systolic function.  Left main artery was normal.  LAD did  not reveal any evidence of significant stenosis.  Left circumflex had  mild 40% lesion in the proximal obtuse marginal, which was not deemed to  be significant.  RCA was very large, dominant with large PDA in  posterolateral branch with no evidence of significant  stenosis, although  there was mild atherosclerosis in the coronary angiography.  The  decision was to manage the patient with medical therapy and further  diuresis to help with pulmonary hypertension.   We asked the neurology group to see the patient, and consult was done by  Dr. Darvin Neighbours on August 20, 2006.  He felt that the hematuria is probably  secondary to benign prostatic hypertrophy, catheter trauma, and some  anticoagulation therapy, so he recommended the patient to continue  Flomax 0.4 mg q.h.s.  Urine culture and sensitivity, renal ultrasound,  and Avodart 0.5 mg daily. .   Renal ultrasound was done on August 22, 2006, and it did not  show any  significant abnormality.  The gross hematuria significantly improved,  and urinalysis revealed amorphous urate with mucus present, large number  of hemoglobin, and moderate bilirubin.  It was positive for nitrites and  trace leukocyte esterase.  The urine culture did not show any gross and  was reincubated and still remained sterile.   The patient's respiratory status completely cleared off by the end of  hospitalization.  He was seen by pulmonary service on August 28, 2006,  and the patient was diagnosed with hypercarbic respiratory failure on  admission, complicated by community-acquired pneumonia, atrial  fibrillation, and hypertension.  He is O2 dependent at this time and  would be going home on oxygen 2 liters nasal cannula and qualifies for  BiPAP because of his hypercarbic respiratory failure.  He would need to  follow up with Dr. Craige Cotta as an outpatient for a BiPAP therapy.   He was seen last time by urologist on August 28, 2006, but he was stable  for discharge home with outpatient followup with Dr. Earlene Plater some time  next week.   HOSPITAL LABORATORY:  His pro time on the day of discharge was 1.4, and  the patient was not on any Lovenox or heparin.  CBC showed white blood  cell count 11.1, hemoglobin 13.4, hematocrit 40.7, platelets 271.  BMP  showed sodium 140, potassium 4.4, chloride 103, CO2 of 32, glucose of  15, BUN 9, creatinine 0.85, calcium 8.9.  BNP 65 on 12th of August.  Magnesium 2.1.  Blood culture was negative.  Hemoglobin A1c 7.3, uric  acid 8.0, and liver function tests were normal.   DISCHARGE MEDICATIONS:  1. Diovan 90 mg daily.  2. Trimpex 100 mg b.i.d.  3. Coumadin 10 mg daily.  4. Amaryl 10 mg daily.  5. Nexium 40 mg daily.  6. Flomax 0.4 mg daily.  7. Zocor 20 mg daily.  8. Tiazac 140 mg daily.  9. Avodart 0.5 mg daily.  10.Lantus 15 units q.h.s.  11.Zoloft 50 mg daily.   FOLLOWUP:  He needs to follow up with the urologist, Dr. Earlene Plater,  some  time in the middle of next week.  The patient is to call and make that  appointment.  Our office will contact the patient and set up an  appointment with Dr. Domingo Sep.  The patient also needs to have  appointment with Dr. Nobie Putnam to address the issue of diabetes mellitus.      Raymon Mutton, P.A.    ______________________________  Madaline Savage, M.D.    MK/MEDQ  D:  08/30/2006  T:  08/30/2006  Job:  [id number]   cc:   [facility]  Patrica Duel, M.D.

## 2010-05-28 NOTE — Procedures (Signed)
NAME:  ALMUS, WOODHAM NO.:  [id number]   MEDICAL RECORD NO.:  [id number]          PATIENT TYPE:  OUT   LOCATION:  RESP                          FACILITY:  [facility]   PHYSICIAN:  Edward L. Juanetta Gosling, M.D.DATE OF BIRTH:  [birth year]   DATE OF PROCEDURE:  DATE OF DISCHARGE:                            PULMONARY FUNCTION TEST   FINDINGS:  1. Spirometry shows a moderate ventilatory defect with evidence of      airflow obstruction, but also with a typical appearance of a      restrictive change.  2. Lung volumes confirm restrictive change with a moderate reduction      in total lung capacity.  3. DLCO is mildly reduced.  4. Arterial blood gas shows severe hypoxemia and elevated pCO2,      suggesting chronic hypoxic respiratory failure.  5. There is deterioration of pulmonary function with use of inhaled      bronchodilator.      Edward L. Juanetta Gosling, M.D.  Electronically Signed     ELH/MEDQ  D:  07/02/2006  T:  07/03/2006  Job:  [id number]   cc:   Dani Gobble, MD  Fax: [phone]

## 2010-05-28 NOTE — H&P (Signed)
NAME:  [name] [name], [name] [name] NO.:  [id number]   MEDICAL RECORD NO.:  [id number]          PATIENT TYPE:  INP   LOCATION:  1414                         FACILITY:  [facility]   PHYSICIAN:  Lucrezia Starch. Earlene Plater, M.D.  DATE OF BIRTH:  May 03, 1940   DATE OF ADMISSION:  10/30/2006  DATE OF DISCHARGE:                              HISTORY & PHYSICAL   CHIEF COMPLAINT:  I can't urinate.   HISTORY OF PRESENT ILLNESS:  [name] [name] is a very nice 70 year old obese  diabetic male who presents in urinary retention.  He was referred by Dr.  Nobie Putnam.  He has been currently on Flomax and has had progressive  symptoms but most recently developed urinary retention and required a  Foley catheter.  He developed hematuria with clots and was on Coumadin.  The Coumadin was stopped.  The urine cleared.  He subsequently removed  his own catheter, could not urinate and it had to be replaced.  He  underwent urodynamic evaluation which shows he does generate high  detrusor pressures with no flow so we was felt he was a candidate for  transurethral resection of the prostate.  Cystourethroscopy was  performed by Dr. Vernie Ammons.  There were also inflamed areas of the bladder  they thought may need to be biopsied simultaneously and significant  trilobar hypertrophy.  After understanding the risks, benefits and  alternatives and being properly cleared for surgery he elected to  proceed.   PAST MEDICAL HISTORY:  He is allergic to CEPHALOSPORINS, SEPTRA, CIPRO.   MEDICATIONS:  He is currently on Diovan 40 mg p.o. daily.  He stopped  his Coumadin which he will restart.  He is on trimethoprim 100 mg twice  a day.  He is on Amaryl 2 mg each day, Nexium 40 mg at bedtime, Flomax  0.4 mg p.o. q. a.m., Zocor 20 mg p.o. daily at bedtime, Tiazac 240 mg  each evening, Avodart 0.5 mg each evening, Lantus insulin 15 units subcu  at bedtime, Zoloft 50 mg at bedtime, allopurinol 300 mg p.o. q. a.m. and  Enablex 15 mg for bladder  spasm.   PAST MEDICAL HISTORY:  He has diabetes.  He has hypertension.  He has  chronic atrial fibrillation.  He has had some esophageal problems and he  has sleep apnea.  Also he has mild pulmonary hypertension with a normal  ejection fraction.   SOCIAL HISTORY:  Negative smoker, negative drinker.   REVIEW OF SYSTEMS:  He does have shortness of breath which he is treated  for.  He has some dyspnea with exertion and he has been cleared for  surgery by cardiology with normal ejection fraction.  He has a history  of kidney stone disease.  He has urinary retention and known umbilical  hernia and he has had blood in his urine.   PHYSICAL EXAMINATION:  VITAL SIGNS:  On physical examination he is  afebrile.  Vital signs stable.  GENERAL:  He is obese and in no acute distress, oriented x3.  HEENT:  Normal.  NECK:  Neck without masses or thyromegaly.  CHEST:  Has normal  diaphragmatic motion with a few rales noted.  HEART:  Irregular irregular rhythm without murmurs or gallops.  ABDOMEN:  Soft, nontender without mass or organomegaly.  He does have an  umbilical hernia and he is obese.  EXTREMITIES:  Normal.  NEUROLOGICAL:  Intact.  SKIN:  Normal.  GU:  Penis, meatus, scrotum, testicle, adnexa, anus, perineum normal.  RECTAL:  The prostate is 35 grams, benign to palpation.   IMPRESSION:  BPH (benign prostatic hypertrophy), urinary retention,  questionable bladder mass.   PLAN:  Cysto, TURP, possible bladder biopsy.      Ronald L. Earlene Plater, M.D.  Electronically Signed     RLD/MEDQ  D:  10/30/2006  T:  10/31/2006  Job:  [id number]
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
## 2010-05-31 NOTE — Discharge Summary (Signed)
NAME:  [name] [name], [name] [name] NO.:  [id number]   MEDICAL RECORD NO.:  [id number]          PATIENT TYPE:  INP   LOCATION:  1498                         FACILITY:  [facility]   PHYSICIAN:  Lucrezia Starch. Earlene Plater, M.D.  DATE OF BIRTH:  10-14-1940   DATE OF ADMISSION:  10/30/2006  DATE OF DISCHARGE:  11/05/2006                               DISCHARGE SUMMARY   DIAGNOSIS:  1. Benign prostatic hypertrophy.  2. Bladder neck obstruction.   OPERATIVE PROCEDURE:  1. Cystoscopy.  2. Transurethral resection of the prostate.   COMPLICATIONS:  Displaced Foley catheter requiring a repeat operation on  October 31, 2093 Dr. Patsi Sears.   HISTORY AND PHYSICAL EXAMINATION:  [name] [name] is a very nice 70 year old,  obese, diabetic male who presents with urinary retention.  He has been  maintained on Flomax, had progressive symptoms, and developed retention.  He has failed voiding trials.  He has undergone urodynamic evaluation  which reveals that he does have an active bladder; and after  understanding risks, benefits, and alternatives would like to proceed  with cystoscopy and TURP.   Past medical history, social history, family history, and review of  systems please see signed patient medical history sheet for full detail.   PHYSICAL EXAMINATION:  GENERAL:  He is afebrile, obese in no acute  distress, oriented x3.  HEAD, NOSE AND THROAT:  Normal.  NECK:  Without masses or thyromegaly.  CHEST:  Normal diaphragmatic motion.  ABDOMEN:  Soft, nontender without masses or organomegaly.  He does have  an umbilical hernia.  He is obese.  HEART: Irregular rate and rhythm without murmurs or gallop.  EXTREMITIES: Normal.  NEUROLOGIC:  Intact.  GENITALIA:  Penis and testicles without lesion.  Prostate is 35 grams  benign to palpation.   HOSPITAL COURSE:  The patient was admitted after undergoing proper preop  evaluation and subsequently taken to surgery on October 30, 2006 and  underwent cystoscopy  and TURP.  Initially on October 30, 2006 he was  having some bladder penile pain, p.o. meds and morphine.  His postop  laboratory evaluation was essentially normal.   On postop day #1 he was doing well.  The catheter appeared to be  draining well.  He had 600 mL output.  By postop day #2 he was called  and had rapid tachycardia, atrial fibrillation with race of 150-160, and  cardiology was called to see him.  He was subsequently treated by  cardiology, was found to have a creatinine of 2.21, a potassium of 5.6,  and cardiac enzymes were obtained.  Subsequently attempts to take the  Foley catheter out were unsuccessful, and on CT scan it was felt that  the catheter had been displaced and was in the intra-abdominal position.  Thoughts were that the catheter would not deflate and on repositioning  probably undermine the trigone if it was not in a true intra-abdominal  position.  He was subsequently taken to surgery by Dr. Patsi Sears on  October 19, 71 2008.  The catheter was easily removed at that time, and a  27-French Ainsworth catheter was placed into the  bladder.  He  subsequently did much better.  Management was rather uncomplicated at  that point.  His urine was clear, his diet was advanced, and the plan  was to have him go home with the Foley catheter in, and remove it in the  office.   Pulmonary evaluated the patient also.  He was instructed to take  Lovenox, and then he would increase this, he would begin his Coumadin.  He was subsequently discharged on November 05, 2006.  Discharge condition  was improved.  He did have a Foley catheter.   DISCHARGE MEDICATIONS INCLUDED:  Trimethoprim, Diovan, Amaryl, Nexium,  Flomax, Tiazac, Avodart, Lantus insulin, Zoloft, allopurinol and the  cardiac medications along with Lovenox until his Coumadin was  therapeutic.   He was to followup with the cardiologist.  He was also to followup with  [place] in a week, and we would wait two weeks to remove his  catheter.  Discharge condition was improved.  Pathology revealed benign hyperplasia  with acute and chronic inflammation.      Ronald L. Earlene Plater, M.D.  Electronically Signed     RLD/MEDQ  D:  12/16/2006  T:  12/17/2006  Job:  [id number]

## 2010-06-03 ENCOUNTER — Encounter: Payer: Self-pay | Admitting: Pulmonary Disease

## 2010-06-07 ENCOUNTER — Ambulatory Visit: Payer: Self-pay | Admitting: Pulmonary Disease

## 2010-10-04 LAB — ALBUMIN: Albumin: 3.6

## 2010-10-04 LAB — COMPREHENSIVE METABOLIC PANEL
Albumin: 3.1 — ABNORMAL LOW
Alkaline Phosphatase: 72
BUN: 7
GFR calc Af Amer: >60
Potassium: 2.9 — ABNORMAL LOW
Sodium: 141
Total Protein: 6.3

## 2010-10-04 LAB — MAGNESIUM
Magnesium: 2
Magnesium: 2
Magnesium: 2
Magnesium: 2.2
Magnesium: 2.2

## 2010-10-04 LAB — BASIC METABOLIC PANEL
BUN: 12
BUN: 18
BUN: 20
BUN: 21
Calcium: 8.8
Calcium: 8.9
Calcium: 9
Calcium: 9.3
Chloride: 92 — ABNORMAL LOW
Chloride: 94 — ABNORMAL LOW
Chloride: 94 — ABNORMAL LOW
Creatinine, Ser: 0.74
Creatinine, Ser: 0.76
Creatinine, Ser: 0.85
GFR calc Af Amer: >60
GFR calc Af Amer: >60
GFR calc Af Amer: >60
GFR calc non Af Amer: >60
GFR calc non Af Amer: >60
GFR calc non Af Amer: >60
GFR calc non Af Amer: >60
GFR calc non Af Amer: >60
GFR calc non Af Amer: >60
Glucose, Bld: 154 — ABNORMAL HIGH
Glucose, Bld: 155 — ABNORMAL HIGH
Glucose, Bld: 160 — ABNORMAL HIGH
Glucose, Bld: 188 — ABNORMAL HIGH
Potassium: 2.8 — ABNORMAL LOW
Potassium: 3.6
Potassium: 3.6
Potassium: 4.1
Sodium: 138
Sodium: 140
Sodium: 140
Sodium: 141
Sodium: 142

## 2010-10-04 LAB — CBC
HCT: 38.3 — ABNORMAL LOW
HCT: 39.3
HCT: 40.5
HCT: 41.1
Hemoglobin: 12.8 — ABNORMAL LOW
Hemoglobin: 13.2
Hemoglobin: 13.2
MCHC: 32.7
MCHC: 32.7
MCHC: 32.8
MCV: 90.9
MCV: 91
MCV: 91.1
MCV: 91.2
Platelets: 215
Platelets: 215
Platelets: 218
Platelets: 221
Platelets: 234
Platelets: 252
RBC: 4.22
RBC: 4.31
RBC: 4.36
RBC: 4.54
RBC: 4.87
RDW: 14.9
RDW: 14.9
RDW: 15.2
WBC: 11.2 — ABNORMAL HIGH
WBC: 11.6 — ABNORMAL HIGH
WBC: 12.3 — ABNORMAL HIGH
WBC: 13.1 — ABNORMAL HIGH
WBC: 13.6 — ABNORMAL HIGH
WBC: 14 — ABNORMAL HIGH
WBC: 14.1 — ABNORMAL HIGH

## 2010-10-04 LAB — I-STAT 8, (EC8 V) (CONVERTED LAB)
BUN: 13
Chloride: 105
Glucose, Bld: 117 — ABNORMAL HIGH
pCO2, Ven: 58.6 — ABNORMAL HIGH
pH, Ven: 7.387 — ABNORMAL HIGH

## 2010-10-04 LAB — PROTIME-INR
INR: 1
INR: 1.1
INR: 1.1
INR: 1.2
INR: 1.6 — ABNORMAL HIGH
INR: 1.6 — ABNORMAL HIGH
INR: 2.1 — ABNORMAL HIGH
INR: 2.1 — ABNORMAL HIGH
Prothrombin Time: 13.8
Prothrombin Time: 14.4
Prothrombin Time: 15.1
Prothrombin Time: 15.2
Prothrombin Time: 23.8 — ABNORMAL HIGH
Prothrombin Time: 24.6 — ABNORMAL HIGH

## 2010-10-04 LAB — DIFFERENTIAL
Basophils Relative: 1
Monocytes Relative: 5
Neutro Abs: 8.5 — ABNORMAL HIGH
Neutrophils Relative %: 65

## 2010-10-04 LAB — APTT
aPTT: 28
aPTT: 37

## 2010-10-04 LAB — GLUCOSE, SEROUS FLUID: Glucose, Fluid: 177

## 2010-10-04 LAB — POCT CARDIAC MARKERS
Myoglobin, poc: 119
Operator id: 294521

## 2010-10-04 LAB — BODY FLUID CELL COUNT WITH DIFFERENTIAL
Eos, Fluid: 15
Monocyte-Macrophage-Serous Fluid: 43 — ABNORMAL LOW
Neutrophil Count, Fluid: 26 — ABNORMAL HIGH
Total Nucleated Cell Count, Fluid: 505

## 2010-10-04 LAB — ALBUMIN, FLUID (OTHER)

## 2010-10-04 LAB — LACTATE DEHYDROGENASE
LDH: 114
LDH: 149

## 2010-10-04 LAB — AMYLASE, BODY FLUID

## 2010-10-04 LAB — PROTEIN C, TOTAL: Protein C, Total: 96 % (ref 70–140)

## 2010-10-04 LAB — POCT I-STAT CREATININE
Creatinine, Ser: 0.9
Operator id: 294521

## 2010-10-04 LAB — AMYLASE: Amylase: 43

## 2010-10-04 LAB — PHOSPHORUS: Phosphorus: 4.2

## 2010-10-04 LAB — B-NATRIURETIC PEPTIDE (CONVERTED LAB): Pro B Natriuretic peptide (BNP): 120 — ABNORMAL HIGH

## 2010-10-04 LAB — PH, BODY FLUID: pH, Fluid: 7

## 2010-10-07 LAB — BASIC METABOLIC PANEL
BUN: 20
Calcium: 9
Calcium: 9.1
Calcium: 9.2
Chloride: 94 — ABNORMAL LOW
Creatinine, Ser: 0.84
Creatinine, Ser: 0.86
GFR calc Af Amer: >60
GFR calc Af Amer: >60
GFR calc Af Amer: >60
GFR calc non Af Amer: >60
GFR calc non Af Amer: >60
Glucose, Bld: 214 — ABNORMAL HIGH
Potassium: 3.6
Sodium: 142
Sodium: 143

## 2010-10-07 LAB — CBC
HCT: 38.7 — ABNORMAL LOW
Hemoglobin: 12.7 — ABNORMAL LOW
RBC: 4.26
RDW: 15.3
WBC: 11.5 — ABNORMAL HIGH

## 2010-10-07 LAB — PROTIME-INR
INR: 1.1
INR: 1.2
INR: 1.4
Prothrombin Time: 14.2
Prothrombin Time: 15.2
Prothrombin Time: 17.3 — ABNORMAL HIGH

## 2010-10-07 LAB — APTT: aPTT: 34

## 2010-10-07 LAB — PHOSPHORUS: Phosphorus: 3.9

## 2010-10-08 LAB — BASIC METABOLIC PANEL
BUN: 26 — ABNORMAL HIGH
CO2: 37 — ABNORMAL HIGH
CO2: 41 — ABNORMAL HIGH
Calcium: 9.5
Calcium: 9.5
Creatinine, Ser: 0.84
Creatinine, Ser: 1.02
GFR calc Af Amer: >60
Glucose, Bld: 174 — ABNORMAL HIGH

## 2010-10-08 LAB — POCT I-STAT 3, ART BLOOD GAS (G3+)
Acid-Base Excess: 8 — ABNORMAL HIGH
Bicarbonate: 37.4 — ABNORMAL HIGH
O2 Saturation: 89
Operator id: 273391
TCO2: 39
TCO2: 40
pCO2 arterial: 75.8
pH, Arterial: 7.295 — ABNORMAL LOW

## 2010-10-08 LAB — CBC
HCT: 39
Hemoglobin: 13.2
Hemoglobin: 13.9
Hemoglobin: 14.1
MCHC: 32.7
MCHC: 33
MCHC: 33.5
MCHC: 33.8
MCV: 90.6
Platelets: 220
RBC: 4.4
RBC: 4.59
RDW: 15.6 — ABNORMAL HIGH
RDW: 16.3 — ABNORMAL HIGH

## 2010-10-08 LAB — POCT CARDIAC MARKERS
CKMB, poc: <1 — ABNORMAL LOW
Myoglobin, poc: 33.1
Troponin i, poc: <0.05
Troponin i, poc: <0.05

## 2010-10-08 LAB — COMPREHENSIVE METABOLIC PANEL
ALT: 17
Alkaline Phosphatase: 81
CO2: 40 — ABNORMAL HIGH
Chloride: 94 — ABNORMAL LOW
GFR calc non Af Amer: >60
Glucose, Bld: 187 — ABNORMAL HIGH
Potassium: 3.4 — ABNORMAL LOW
Sodium: 143
Total Bilirubin: 0.7
Total Protein: 6.7

## 2010-10-08 LAB — LIPID PANEL
Cholesterol: 135
Total CHOL/HDL Ratio: 4.2

## 2010-10-08 LAB — CARDIAC PANEL(CRET KIN+CKTOT+MB+TROPI)
CK, MB: 0.9
CK, MB: 1
CK, MB: 1
CK, MB: 1.1
CK, MB: 1.2
Relative Index: INVALID
Relative Index: INVALID
Relative Index: INVALID
Total CK: 20
Total CK: 25
Total CK: 33
Troponin I: 0.03
Troponin I: 0.03
Troponin I: 0.04

## 2010-10-08 LAB — POCT I-STAT, CHEM 8
Chloride: 101
Creatinine, Ser: 0.9
Glucose, Bld: 215 — ABNORMAL HIGH
Hemoglobin: 15.6
Potassium: 3.6
Sodium: 140

## 2010-10-08 LAB — BLOOD GAS, ARTERIAL
Bicarbonate: 39.2 — ABNORMAL HIGH
Delivery systems: POSITIVE
Patient temperature: 98.6
pH, Arterial: 7.38

## 2010-10-08 LAB — CULTURE, RESPIRATORY W GRAM STAIN: Culture: NORMAL

## 2010-10-08 LAB — D-DIMER, QUANTITATIVE: D-Dimer, Quant: 1.47 — ABNORMAL HIGH

## 2010-10-08 LAB — DIFFERENTIAL
Basophils Absolute: 0.1
Basophils Relative: 0
Eosinophils Relative: 6 — ABNORMAL HIGH
Lymphocytes Relative: 28
Monocytes Absolute: 0.9
Neutro Abs: 9.1 — ABNORMAL HIGH

## 2010-10-08 LAB — PROTIME-INR
INR: 2.1 — ABNORMAL HIGH
Prothrombin Time: 24.6 — ABNORMAL HIGH
Prothrombin Time: 25.6 — ABNORMAL HIGH

## 2010-10-08 LAB — EXPECTORATED SPUTUM ASSESSMENT W GRAM STAIN, RFLX TO RESP C

## 2010-10-10 LAB — CULTURE, BLOOD (ROUTINE X 2)
Culture: NO GROWTH
Culture: NO GROWTH

## 2010-10-10 LAB — COMPREHENSIVE METABOLIC PANEL
ALT: 14
AST: 18
Albumin: 3.4 — ABNORMAL LOW
Alkaline Phosphatase: 74
BUN: 22
CO2: 36 — ABNORMAL HIGH
Chloride: 93 — ABNORMAL LOW
Chloride: 96
Creatinine, Ser: 0.84
GFR calc Af Amer: >60
GFR calc non Af Amer: >60
Glucose, Bld: 157 — ABNORMAL HIGH
Potassium: 3.9
Sodium: 140
Total Bilirubin: 0.5
Total Bilirubin: 0.5
Total Protein: 6.5

## 2010-10-10 LAB — BASIC METABOLIC PANEL
BUN: 13
BUN: 15
BUN: 9
CO2: 43 — ABNORMAL HIGH
Calcium: 8.9
Chloride: 95 — ABNORMAL LOW
Creatinine, Ser: 0.7
Creatinine, Ser: 0.85
GFR calc non Af Amer: >60
GFR calc non Af Amer: >60
Glucose, Bld: 162 — ABNORMAL HIGH

## 2010-10-10 LAB — DIFFERENTIAL
Basophils Absolute: 0
Basophils Relative: 0
Eosinophils Absolute: 0.3
Eosinophils Relative: 2
Lymphs Abs: 2.1

## 2010-10-10 LAB — CBC
HCT: 38.6 — ABNORMAL LOW
HCT: 40.4
Hemoglobin: 13.4
MCHC: 32.6
MCHC: 32.6
MCV: 90.3
MCV: 90.7
Platelets: 216
Platelets: 223
Platelets: 231
RBC: 4.47
RDW: 14.8
RDW: 15
RDW: 15.3
WBC: 11.3 — ABNORMAL HIGH
WBC: 19.2 — ABNORMAL HIGH

## 2010-10-10 LAB — B-NATRIURETIC PEPTIDE (CONVERTED LAB)
Pro B Natriuretic peptide (BNP): 156 — ABNORMAL HIGH
Pro B Natriuretic peptide (BNP): 275 — ABNORMAL HIGH

## 2010-10-10 LAB — URINALYSIS, ROUTINE W REFLEX MICROSCOPIC
Bilirubin Urine: NEGATIVE
Hgb urine dipstick: NEGATIVE
Protein, ur: NEGATIVE
Urobilinogen, UA: 0.2

## 2010-10-10 LAB — URINE CULTURE

## 2010-10-10 LAB — PROTIME-INR
INR: 0.9
Prothrombin Time: 12.5
Prothrombin Time: 15.6 — ABNORMAL HIGH

## 2010-10-10 LAB — POCT CARDIAC MARKERS
Operator id: 146091
Operator id: 272551
Troponin i, poc: <0.05

## 2010-10-10 LAB — CK TOTAL AND CKMB (NOT AT ARMC)
CK, MB: 1.2
Relative Index: INVALID
Total CK: 25

## 2010-10-10 LAB — PROTEIN, TOTAL: Total Protein: 6.9

## 2010-10-10 LAB — POCT I-STAT 3, ART BLOOD GAS (G3+)
Acid-Base Excess: 15 — ABNORMAL HIGH
Operator id: 276051
pO2, Arterial: 36 — CL

## 2010-10-10 LAB — TROPONIN I
Troponin I: 0.01
Troponin I: 0.01

## 2010-10-10 LAB — PHOSPHORUS: Phosphorus: 3.6

## 2010-10-22 LAB — COMPREHENSIVE METABOLIC PANEL
ALT: 31
ALT: 48
ALT: 57 — ABNORMAL HIGH
AST: 114 — ABNORMAL HIGH
AST: 26
AST: 76 — ABNORMAL HIGH
Albumin: 1.8 — ABNORMAL LOW
Albumin: 2 — ABNORMAL LOW
Albumin: 2.1 — ABNORMAL LOW
Albumin: 2.7 — ABNORMAL LOW
Alkaline Phosphatase: 119 — ABNORMAL HIGH
Alkaline Phosphatase: 189 — ABNORMAL HIGH
BUN: 10
BUN: 14
BUN: 16
BUN: 21
CO2: 29
Calcium: 7.9 — ABNORMAL LOW
Calcium: 8 — ABNORMAL LOW
Calcium: 8.4
Chloride: 102
Chloride: 102
Chloride: 104
Chloride: 97
Creatinine, Ser: 1
Creatinine, Ser: 1.01
Creatinine, Ser: 1.06
Creatinine, Ser: 1.1
GFR calc Af Amer: >60
GFR calc Af Amer: >60
GFR calc Af Amer: >60
GFR calc non Af Amer: >60
GFR calc non Af Amer: >60
GFR calc non Af Amer: >60
Potassium: 3.3 — ABNORMAL LOW
Potassium: 4.1
Sodium: 135
Sodium: 136
Sodium: 142
Total Bilirubin: 0.6
Total Bilirubin: 0.7
Total Bilirubin: 0.9
Total Bilirubin: 1
Total Protein: 5 — ABNORMAL LOW
Total Protein: 5.5 — ABNORMAL LOW
Total Protein: 7

## 2010-10-22 LAB — CARDIAC PANEL(CRET KIN+CKTOT+MB+TROPI)
CK, MB: 0.9
Relative Index: 0.7
Relative Index: 0.7
Total CK: 121
Total CK: 131
Total CK: 150
Troponin I: 0.04
Troponin I: 0.05
Troponin I: 0.05

## 2010-10-22 LAB — BLOOD GAS, ARTERIAL
Acid-Base Excess: 3.3 — ABNORMAL HIGH
Acid-Base Excess: 3.5 — ABNORMAL HIGH
Acid-Base Excess: 3.7 — ABNORMAL HIGH
Acid-Base Excess: 4.4 — ABNORMAL HIGH
FIO2: 0.3
FIO2: 0.3
FIO2: 0.3
FIO2: 1
MECHVT: 600
MECHVT: 600
O2 Saturation: 92.4
O2 Saturation: 93.9
O2 Saturation: 94.6
O2 Saturation: 97
PEEP: 0.5
Patient temperature: 98.6
Patient temperature: 99.4
Patient temperature: 99.4
Pressure support: 10
RATE: 12
TCO2: 25.6
TCO2: 26.4
TCO2: 26.4
pCO2 arterial: 43
pCO2 arterial: 48.3 — ABNORMAL HIGH
pCO2 arterial: 56 — ABNORMAL HIGH
pH, Arterial: 7.34 — ABNORMAL LOW
pH, Arterial: 7.419
pO2, Arterial: 195 — ABNORMAL HIGH
pO2, Arterial: 82.9
pO2, Arterial: 84.5

## 2010-10-22 LAB — URINALYSIS, ROUTINE W REFLEX MICROSCOPIC
Nitrite: POSITIVE — AB
Protein, ur: 100 — AB
Urobilinogen, UA: 1

## 2010-10-22 LAB — CBC
HCT: 30.4 — ABNORMAL LOW
HCT: 33.7 — ABNORMAL LOW
HCT: 35.7 — ABNORMAL LOW
HCT: 36.7 — ABNORMAL LOW
HCT: 38.4 — ABNORMAL LOW
Hemoglobin: 11.1 — ABNORMAL LOW
Hemoglobin: 11.7 — ABNORMAL LOW
Hemoglobin: 9.6 — ABNORMAL LOW
MCHC: 32.6
MCHC: 32.8
MCHC: 33.1
MCHC: 33.2
MCV: 91.9
MCV: 92.4
MCV: 93.2
MCV: 93.2
Platelets: 238
Platelets: 294
Platelets: 304
Platelets: 335
RBC: 3.14 — ABNORMAL LOW
RBC: 3.66 — ABNORMAL LOW
RDW: 15.5 — ABNORMAL HIGH
RDW: 15.7 — ABNORMAL HIGH
WBC: 12.4 — ABNORMAL HIGH
WBC: 23.8 — ABNORMAL HIGH
WBC: 8.6
WBC: 9.1
WBC: 9.7

## 2010-10-22 LAB — BASIC METABOLIC PANEL
BUN: 8
Calcium: 7.8 — ABNORMAL LOW
Calcium: 8.2 — ABNORMAL LOW
Calcium: 8.3 — ABNORMAL LOW
Creatinine, Ser: 0.75
Creatinine, Ser: 1.07
GFR calc Af Amer: >60
GFR calc non Af Amer: >60
GFR calc non Af Amer: >60
GFR calc non Af Amer: >60
Glucose, Bld: 145 — ABNORMAL HIGH
Potassium: 3.4 — ABNORMAL LOW
Sodium: 137
Sodium: 140
Sodium: 141

## 2010-10-22 LAB — DIFFERENTIAL
Basophils Absolute: 0
Basophils Relative: 0
Eosinophils Absolute: 0.1
Eosinophils Relative: 0
Monocytes Absolute: 1.2 — ABNORMAL HIGH
Monocytes Relative: 5
Neutro Abs: 20.5 — ABNORMAL HIGH

## 2010-10-22 LAB — B-NATRIURETIC PEPTIDE (CONVERTED LAB): Pro B Natriuretic peptide (BNP): 224 — ABNORMAL HIGH

## 2010-10-22 LAB — URINE MICROSCOPIC-ADD ON

## 2010-10-22 LAB — CULTURE, BLOOD (ROUTINE X 2)
Culture: NO GROWTH
Report Status: 11082008

## 2010-10-22 LAB — URINE CULTURE
Colony Count: NO GROWTH
Culture: NO GROWTH

## 2010-10-22 LAB — LIPASE, BLOOD: Lipase: 13

## 2010-10-22 LAB — AMYLASE: Amylase: 12 — ABNORMAL LOW

## 2010-10-22 LAB — APTT: aPTT: 40 — ABNORMAL HIGH

## 2010-10-22 LAB — PHOSPHORUS
Phosphorus: 3.9
Phosphorus: 4.7 — ABNORMAL HIGH

## 2010-10-22 LAB — PROTIME-INR: Prothrombin Time: 16.4 — ABNORMAL HIGH

## 2010-10-23 LAB — BLOOD GAS, ARTERIAL
Acid-base deficit: 4 — ABNORMAL HIGH
Bicarbonate: 22.7
Bicarbonate: 22.7
Bicarbonate: 25.3 — ABNORMAL HIGH
FIO2: 0.5
FIO2: 0.6
MECHVT: 600
O2 Saturation: 92.9
O2 Saturation: 93
PEEP: 5
PEEP: 5
Patient temperature: 98.6
RATE: 10
TCO2: 21.6
TCO2: 23.8
pCO2 arterial: 44.8
pCO2 arterial: 51.3 — ABNORMAL HIGH
pCO2 arterial: 52.1 — ABNORMAL HIGH
pH, Arterial: 7.263 — ABNORMAL LOW
pH, Arterial: 7.315 — ABNORMAL LOW
pO2, Arterial: 68.2 — ABNORMAL LOW
pO2, Arterial: 70.6 — ABNORMAL LOW
pO2, Arterial: 95.7

## 2010-10-23 LAB — CULTURE, BLOOD (ROUTINE X 2): Culture: NO GROWTH

## 2010-10-23 LAB — CBC
HCT: 31.3 — ABNORMAL LOW
HCT: 35.6 — ABNORMAL LOW
HCT: 36.3 — ABNORMAL LOW
Hemoglobin: 10.4 — ABNORMAL LOW
MCHC: 33.2
MCV: 93.1
MCV: 93.2
Platelets: 199
Platelets: 201
RBC: 3.36 — ABNORMAL LOW
RBC: 3.41 — ABNORMAL LOW
RBC: 3.83 — ABNORMAL LOW
RBC: 3.89 — ABNORMAL LOW
RDW: 15.6 — ABNORMAL HIGH
RDW: 15.8 — ABNORMAL HIGH
WBC: 10.9 — ABNORMAL HIGH
WBC: 11.8 — ABNORMAL HIGH
WBC: 11.9 — ABNORMAL HIGH
WBC: 12.4 — ABNORMAL HIGH
WBC: 15.8 — ABNORMAL HIGH

## 2010-10-23 LAB — BASIC METABOLIC PANEL
BUN: 11
BUN: 18
BUN: 19
CO2: 26
CO2: 29
CO2: 31
Calcium: 8.7
Calcium: 8.7
Chloride: 105
Chloride: 97
Chloride: 97
Chloride: 98
Chloride: 99
Creatinine, Ser: 0.89
Creatinine, Ser: 1.03
Creatinine, Ser: 2.92 — ABNORMAL HIGH
GFR calc Af Amer: 23 — ABNORMAL LOW
GFR calc Af Amer: 26 — ABNORMAL LOW
GFR calc Af Amer: 36 — ABNORMAL LOW
GFR calc Af Amer: >60
GFR calc Af Amer: >60
GFR calc Af Amer: >60
GFR calc non Af Amer: 30 — ABNORMAL LOW
GFR calc non Af Amer: >60
GFR calc non Af Amer: >60
GFR calc non Af Amer: >60
Glucose, Bld: 138 — ABNORMAL HIGH
Glucose, Bld: 173 — ABNORMAL HIGH
Potassium: 3.7
Potassium: 3.8
Potassium: 4.3
Potassium: 4.8
Potassium: 5.6 — ABNORMAL HIGH
Sodium: 128 — ABNORMAL LOW
Sodium: 131 — ABNORMAL LOW
Sodium: 134 — ABNORMAL LOW
Sodium: 138

## 2010-10-23 LAB — PROTIME-INR
INR: 1.1
Prothrombin Time: 14.6

## 2010-10-23 LAB — URINALYSIS, MICROSCOPIC ONLY
Glucose, UA: NEGATIVE
Protein, ur: >300 — AB
pH: 5

## 2010-10-23 LAB — URINE CULTURE

## 2010-10-23 LAB — SODIUM, URINE, RANDOM: Sodium, Ur: 17

## 2010-10-23 LAB — CARDIAC PANEL(CRET KIN+CKTOT+MB+TROPI)
CK, MB: 1.8
Relative Index: INVALID
Total CK: 63

## 2010-10-23 LAB — HEPATIC FUNCTION PANEL
Albumin: 2.5 — ABNORMAL LOW
Indirect Bilirubin: 0.3
Total Bilirubin: 0.5
Total Protein: 5.2 — ABNORMAL LOW

## 2010-10-24 LAB — CBC
HCT: 40.7
Hemoglobin: 13.9
MCHC: 34.2
Platelets: 280
RDW: 15.4 — ABNORMAL HIGH

## 2010-10-24 LAB — URINALYSIS, ROUTINE W REFLEX MICROSCOPIC
Ketones, ur: 15 — AB
Nitrite: POSITIVE — AB
Protein, ur: >300 — AB
Urobilinogen, UA: 2 — ABNORMAL HIGH
pH: 6

## 2010-10-24 LAB — APTT: aPTT: 31

## 2010-10-24 LAB — COMPREHENSIVE METABOLIC PANEL
Albumin: 3.7
BUN: 12
Calcium: 9.5
Creatinine, Ser: 1.11
Glucose, Bld: 207 — ABNORMAL HIGH
Total Protein: 7.2

## 2010-10-24 LAB — PROTIME-INR
INR: 1
Prothrombin Time: 13.5

## 2010-10-24 LAB — URINE MICROSCOPIC-ADD ON

## 2010-10-25 LAB — CBC
HCT: 40.7
MCHC: 32.8
MCV: 95
Platelets: 271
RDW: 15.1 — ABNORMAL HIGH

## 2010-10-25 LAB — URINE CULTURE: Special Requests: NEGATIVE

## 2010-10-28 LAB — BLOOD GAS, ARTERIAL
Acid-Base Excess: 12.4 — ABNORMAL HIGH
Acid-Base Excess: 14.3 — ABNORMAL HIGH
Acid-Base Excess: 14.5 — ABNORMAL HIGH
Acid-Base Excess: 15.7 — ABNORMAL HIGH
Acid-Base Excess: 0.9
Acid-Base Excess: 11.5 — ABNORMAL HIGH
Acid-Base Excess: 4.3 — ABNORMAL HIGH
Acid-Base Excess: 6.8 — ABNORMAL HIGH
Acid-Base Excess: 7 — ABNORMAL HIGH
Bicarbonate: 30.7 — ABNORMAL HIGH
Bicarbonate: 32.4 — ABNORMAL HIGH
Bicarbonate: 37.8 — ABNORMAL HIGH
Delivery systems: POSITIVE
Delivery systems: POSITIVE
Delivery systems: POSITIVE
Delivery systems: POSITIVE
Delivery systems: POSITIVE
Drawn by: 277551
FIO2: 0.3
FIO2: 0.5
FIO2: 0.35
FIO2: 0.4
FIO2: 0.5
FIO2: 0.9
FIO2: 1
MECHVT: 360
MECHVT: 650
MECHVT: 650
O2 Saturation: 95.8
O2 Saturation: 93.7
O2 Saturation: 94.1
O2 Saturation: 94.5
O2 Saturation: 96.6
O2 Saturation: 96.7
O2 Saturation: 97.3
PEEP: 8
PEEP: 8
PEEP: 8
PEEP: 12
PEEP: 5
PEEP: 6
PIP: 14
Patient temperature: 99.6
Patient temperature: 98.4
Patient temperature: 98.6
Patient temperature: 98.6
Patient temperature: 99.4
Pressure support: 12
RATE: 12
RATE: 12
RATE: 12
RATE: 12
RATE: 35
TCO2: 40.1
TCO2: 43
TCO2: 43.7
TCO2: 26.3
TCO2: 32
TCO2: 34.4
TCO2: 38
TCO2: 40.1
pCO2 arterial: 79.4
pCO2 arterial: 83.5
pCO2 arterial: 87.8
pH, Arterial: 7.319 — ABNORMAL LOW
pH, Arterial: 7.331 — ABNORMAL LOW
pH, Arterial: 7.381
pH, Arterial: 7.215 — ABNORMAL LOW
pH, Arterial: 7.237 — ABNORMAL LOW
pH, Arterial: 7.312 — ABNORMAL LOW
pO2, Arterial: 73.8 — ABNORMAL LOW
pO2, Arterial: 78.4 — ABNORMAL LOW
pO2, Arterial: 83.3
pO2, Arterial: 103 — ABNORMAL HIGH
pO2, Arterial: 70 — ABNORMAL LOW

## 2010-10-28 LAB — BASIC METABOLIC PANEL
BUN: 12
BUN: 16
BUN: 6
BUN: 9
BUN: 9
BUN: 9
BUN: 18
BUN: 23
BUN: 25 — ABNORMAL HIGH
CO2: 32
CO2: 32
CO2: 33 — ABNORMAL HIGH
CO2: 39 — ABNORMAL HIGH
CO2: 39 — ABNORMAL HIGH
CO2: 40 — ABNORMAL HIGH
CO2: 42 — ABNORMAL HIGH
CO2: 43 — ABNORMAL HIGH
CO2: 28
CO2: 29
CO2: 30
CO2: 33 — ABNORMAL HIGH
CO2: 36 — ABNORMAL HIGH
Calcium: 8.3 — ABNORMAL LOW
Calcium: 8.7
Calcium: 8.9
Calcium: 9.2
Calcium: 8.1 — ABNORMAL LOW
Calcium: 8.1 — ABNORMAL LOW
Calcium: 8.3 — ABNORMAL LOW
Chloride: 100
Chloride: 102
Chloride: 103
Chloride: 103
Chloride: 90 — ABNORMAL LOW
Chloride: 92 — ABNORMAL LOW
Chloride: 93 — ABNORMAL LOW
Chloride: 94 — ABNORMAL LOW
Chloride: 94 — ABNORMAL LOW
Chloride: 103
Chloride: 103
Creatinine, Ser: 0.81
Creatinine, Ser: 0.82
Creatinine, Ser: 0.85
Creatinine, Ser: 0.85
Creatinine, Ser: 0.92
Creatinine, Ser: 0.97
Creatinine, Ser: 0.97
Creatinine, Ser: 0.98
Creatinine, Ser: 0.86
Creatinine, Ser: 0.87
Creatinine, Ser: 0.89
Creatinine, Ser: 0.89
GFR calc Af Amer: >60
GFR calc Af Amer: >60
GFR calc Af Amer: >60
GFR calc Af Amer: >60
GFR calc Af Amer: >60
GFR calc Af Amer: >60
GFR calc non Af Amer: >60
GFR calc non Af Amer: >60
GFR calc non Af Amer: >60
GFR calc non Af Amer: >60
GFR calc non Af Amer: >60
GFR calc non Af Amer: >60
Glucose, Bld: 140 — ABNORMAL HIGH
Glucose, Bld: 149 — ABNORMAL HIGH
Glucose, Bld: 155 — ABNORMAL HIGH
Glucose, Bld: 164 — ABNORMAL HIGH
Glucose, Bld: 184 — ABNORMAL HIGH
Glucose, Bld: 130 — ABNORMAL HIGH
Glucose, Bld: 142 — ABNORMAL HIGH
Glucose, Bld: 157 — ABNORMAL HIGH
Glucose, Bld: 165 — ABNORMAL HIGH
Glucose, Bld: 176 — ABNORMAL HIGH
Glucose, Bld: 81
Potassium: 3.2 — ABNORMAL LOW
Potassium: 3.2 — ABNORMAL LOW
Potassium: 3.6
Potassium: 3.9
Potassium: 6.2 — ABNORMAL HIGH
Potassium: 3.5
Potassium: 3.8
Sodium: 137
Sodium: 139
Sodium: 140
Sodium: 140
Sodium: 141
Sodium: 141
Sodium: 145
Sodium: 146 — ABNORMAL HIGH

## 2010-10-28 LAB — PROTEIN ELECTROPH W RFLX QUANT IMMUNOGLOBULINS
Albumin ELP: 48.5 — ABNORMAL LOW
Alpha-1-Globulin: 7 — ABNORMAL HIGH
Beta 2: 6.2
Gamma Globulin: 16.4

## 2010-10-28 LAB — MAGNESIUM
Magnesium: 2.1
Magnesium: 2

## 2010-10-28 LAB — URINE MICROSCOPIC-ADD ON

## 2010-10-28 LAB — COMPREHENSIVE METABOLIC PANEL
ALT: 43
AST: 27
Albumin: 2.7 — ABNORMAL LOW
Alkaline Phosphatase: 62
Alkaline Phosphatase: 74
BUN: 12
CO2: 38 — ABNORMAL HIGH
CO2: 40 — ABNORMAL HIGH
CO2: 36 — ABNORMAL HIGH
Calcium: 8.5
Chloride: 93 — ABNORMAL LOW
Chloride: 93 — ABNORMAL LOW
Chloride: 100
Creatinine, Ser: 0.88
Creatinine, Ser: 1.05
Creatinine, Ser: 0.76
GFR calc Af Amer: >60
GFR calc non Af Amer: >60
GFR calc non Af Amer: >60
GFR calc non Af Amer: >60
Glucose, Bld: 137 — ABNORMAL HIGH
Potassium: 3.4 — ABNORMAL LOW
Potassium: 4.1
Sodium: 140
Total Bilirubin: 1.1
Total Bilirubin: 1.2
Total Bilirubin: 0.5

## 2010-10-28 LAB — POCT I-STAT 3, ART BLOOD GAS (G3+)
Acid-Base Excess: 6 — ABNORMAL HIGH
Bicarbonate: 30.9 — ABNORMAL HIGH
O2 Saturation: 99
Operator id: 284701
TCO2: 45
pCO2 arterial: 113.3
pH, Arterial: 7.36
pO2, Arterial: 70 — ABNORMAL LOW
pO2, Arterial: 172 — ABNORMAL HIGH

## 2010-10-28 LAB — CBC
HCT: 40.3
HCT: 41.2
HCT: 42.8
HCT: 41.4
HCT: 44.3
HCT: 50.6
Hemoglobin: 14.2
Hemoglobin: 13.8
Hemoglobin: 14
Hemoglobin: 14.2
Hemoglobin: 14.5
Hemoglobin: 16.4
MCHC: 32.6
MCHC: 32.8
MCHC: 32.8
MCHC: 33
MCHC: 33.2
MCHC: 33.2
MCHC: 32.3
MCHC: 32.5
MCHC: 32.6
MCHC: 32.7
MCV: 94.3
MCV: 94.6
MCV: 95.1
MCV: 95.2
MCV: 95.4
MCV: 95.5
MCV: 95.8
MCV: 93.9
MCV: 96
Platelets: 168
Platelets: 185
Platelets: 186
Platelets: 187
Platelets: 190
Platelets: 197
Platelets: 203
Platelets: 191
Platelets: 208
Platelets: 214
Platelets: 218
RBC: 4.27
RBC: 4.36
RBC: 4.46
RBC: 4.63
RBC: 4.65
RBC: 4.48
RBC: 4.56
RDW: 14.6 — ABNORMAL HIGH
RDW: 14.7 — ABNORMAL HIGH
RDW: 14.8 — ABNORMAL HIGH
RDW: 14.8 — ABNORMAL HIGH
RDW: 14.9 — ABNORMAL HIGH
RDW: 15.1 — ABNORMAL HIGH
RDW: 15.1 — ABNORMAL HIGH
RDW: 15.2 — ABNORMAL HIGH
RDW: 15.2 — ABNORMAL HIGH
RDW: 15.5 — ABNORMAL HIGH
WBC: 10.4
WBC: 11.6 — ABNORMAL HIGH
WBC: 7.3
WBC: 14.4 — ABNORMAL HIGH
WBC: 14.9 — ABNORMAL HIGH
WBC: 15.1 — ABNORMAL HIGH

## 2010-10-28 LAB — URINALYSIS, ROUTINE W REFLEX MICROSCOPIC
Ketones, ur: 15 — AB
Ketones, ur: NEGATIVE
Leukocytes, UA: NEGATIVE
Nitrite: POSITIVE — AB
Nitrite: NEGATIVE
Protein, ur: 30 — AB
Protein, ur: NEGATIVE
Urobilinogen, UA: 0.2
Urobilinogen, UA: 1
pH: 6.5
pH: 5.5

## 2010-10-28 LAB — PROTIME-INR
INR: 1
INR: 1.1
INR: 1.2
Prothrombin Time: 13.3
Prothrombin Time: 13.3

## 2010-10-28 LAB — URINE CULTURE
Colony Count: NO GROWTH
Culture: NO GROWTH

## 2010-10-28 LAB — POCT I-STAT 3, VENOUS BLOOD GAS (G3P V)
O2 Saturation: 69
TCO2: 36
pCO2, Ven: 59.7 — ABNORMAL HIGH
pO2, Ven: 38

## 2010-10-28 LAB — B-NATRIURETIC PEPTIDE (CONVERTED LAB)
Pro B Natriuretic peptide (BNP): 175 — ABNORMAL HIGH
Pro B Natriuretic peptide (BNP): 65
Pro B Natriuretic peptide (BNP): 211 — ABNORMAL HIGH
Pro B Natriuretic peptide (BNP): 73

## 2010-10-28 LAB — CULTURE, BLOOD (ROUTINE X 2): Culture: NO GROWTH

## 2010-10-28 LAB — DIFFERENTIAL
Basophils Absolute: 0
Basophils Absolute: 0
Basophils Absolute: 0.1
Basophils Relative: 0
Basophils Relative: 0
Basophils Relative: 1
Eosinophils Absolute: 0.7
Eosinophils Relative: 2
Lymphocytes Relative: 23
Monocytes Absolute: 0.5
Monocytes Relative: 11
Monocytes Relative: 8
Neutro Abs: 6.7
Neutro Abs: 7
Neutrophils Relative %: 66
Neutrophils Relative %: 67

## 2010-10-28 LAB — TSH: TSH: 2.815

## 2010-10-28 LAB — POTASSIUM: Potassium: 3.5

## 2010-10-28 LAB — CARDIAC PANEL(CRET KIN+CKTOT+MB+TROPI)
CK, MB: 0.7
Total CK: 31
Total CK: 43
Troponin I: 0.04

## 2010-10-28 LAB — URIC ACID: Uric Acid, Serum: 8 — ABNORMAL HIGH

## 2010-10-28 LAB — CULTURE, BAL-QUANTITATIVE W GRAM STAIN: Gram Stain: NONE SEEN

## 2010-10-28 LAB — D-DIMER, QUANTITATIVE: D-Dimer, Quant: 2.73 — ABNORMAL HIGH

## 2010-10-30 LAB — BLOOD GAS, ARTERIAL
Bicarbonate: 34.2 — ABNORMAL HIGH
O2 Saturation: 84.2
Patient temperature: 37
TCO2: 29.7

## 2011-05-05 ENCOUNTER — Telehealth: Payer: Self-pay | Admitting: Pulmonary Disease

## 2011-05-05 NOTE — Telephone Encounter (Signed)
Alida, have you seen this CMN? Please advise, thanks! 

## 2011-05-06 NOTE — Telephone Encounter (Signed)
I do not have a cmn from [facility] for this pt. Called and spoke with Ms Munger and informed her, I do not have cmn for pt, also pt has been seen since November 2010. Pt does have appt sched 05/21/11 @4pm , informed her important to keep that appt, will need Qualifying  sats at that time, if pt has 02 to bring to appt, pt wife agreed. Kandice Hams

## 2011-05-21 ENCOUNTER — Encounter: Payer: Self-pay | Admitting: Pulmonary Disease

## 2011-05-21 ENCOUNTER — Ambulatory Visit (INDEPENDENT_AMBULATORY_CARE_PROVIDER_SITE_OTHER): Payer: Medicare Other | Admitting: Pulmonary Disease

## 2011-05-21 VITALS — BP 158/90 | HR 118 | Temp 97.4°F | Ht 68.0 in | Wt 290.4 lb

## 2011-05-21 DIAGNOSIS — E678 Other specified hyperalimentation: Secondary | ICD-10-CM

## 2011-05-21 DIAGNOSIS — J449 Chronic obstructive pulmonary disease, unspecified: Secondary | ICD-10-CM

## 2011-05-21 DIAGNOSIS — J90 Pleural effusion, not elsewhere classified: Secondary | ICD-10-CM

## 2011-05-21 DIAGNOSIS — J9610 Chronic respiratory failure, unspecified whether with hypoxia or hypercapnia: Secondary | ICD-10-CM

## 2011-05-21 DIAGNOSIS — G4733 Obstructive sleep apnea (adult) (pediatric): Secondary | ICD-10-CM

## 2011-05-21 DIAGNOSIS — J4489 Other specified chronic obstructive pulmonary disease: Secondary | ICD-10-CM

## 2011-05-21 NOTE — Patient Instructions (Signed)
Will keep copy of BPAP report and call with results Follow up in one year

## 2011-05-21 NOTE — Assessment & Plan Note (Signed)
He has chronic left pleural effusion.  Monitor clinically, and repeat CXR if his symptoms progress.

## 2011-05-21 NOTE — Assessment & Plan Note (Signed)
He continues with BPAP.  Will get his download and call him with results.

## 2011-05-21 NOTE — Progress Notes (Signed)
Chief Complaint  Patient presents with  . Follow-up    Last OV 11/2008. Pt states he has his good and bad days with breathing. Denies and cough, wheezing, chest tx--pt needs to requalify for oxygen  . Sleep Apnea    Pt uses his bpap everynight w/ 5 l of oxygen--denies any problems w/ mask/machine    History of Present Illness: [name] [name] is a 71 y.o. male former smoker with COPD, OSA, OHS, hypoxemia, and chronic pleural effusion.  I last saw him in November 2010.  He has been maintained on BPAP and 5 liters oxygen.  He returns today to get his oxygen set up renewed.  He does not use xopenex or spiriva on a regular basis.  He is not having much cough, wheeze, or chest congestion.  Past Medical History  Diagnosis Date  . Unspecified essential hypertension   . Type II or unspecified type diabetes mellitus without mention of complication, not stated as uncontrolled   . Esophageal reflux   . Gout   . Depressive disorder, not elsewhere classified   . OSA (obstructive sleep apnea)   . COPD (chronic obstructive pulmonary disease)   . Respiratory failure   . Atrial fibrillation   . BPH (benign prostatic hyperplasia)     Past Surgical History  Procedure Date  . Turp vaporization   . Cholecystectomy   . Tonsillectomy   . Inguinal hernia repair     Allergies  Allergen Reactions  . Cefaclor   . Cephalexin   . Sulfamethoxazole W-Trimethoprim     Physical Exam:  Blood pressure 158/90, pulse 118, temperature 97.4 F (36.3 C), temperature source Oral, height 5\' 8"  (1.727 m), weight 290 lb 6.4 oz (131.725 kg), SpO2 95.00%.  Body mass index is 44.16 kg/(m^2).  Wt Readings from Last 2 Encounters:  05/21/11 290 lb 6.4 oz (131.725 kg)  12/12/08 285 lb 8 oz (129.502 kg)    General - obese HEENT - no sinus tenderness, no oral exudate Cardiac - irregular Chest - decreased breath sounds left base, no wheeze Abdomen - obese, soft Extremities - ankle edema Skin - no  rashes Neurologic - normal strength Psychiatric - normal mood, behavior   Assessment/Plan:  Outpatient Encounter Prescriptions as of 05/21/2011  Medication Sig Dispense Refill  . allopurinol (ZYLOPRIM) 300 MG tablet Take 300 mg by mouth daily.        Marland Kitchen dextromethorphan-guaiFENesin (MUCINEX DM) 30-600 MG per 12 hr tablet Take 1 tablet by mouth every 12 (twelve) hours.        . dutasteride (AVODART) 0.5 MG capsule Take 0.5 mg by mouth daily.        Marland Kitchen esomeprazole (NEXIUM) 40 MG capsule Take 40 mg by mouth daily before breakfast.        . furosemide (LASIX) 40 MG tablet 2 tablets daily      . insulin aspart protamine-insulin aspart (NOVOLOG 70/30) (70-30) 100 UNIT/ML injection Inject into the skin. 50 units before meals      . insulin glargine (LANTUS) 100 UNIT/ML injection 75 in the am and 75 at bedtime      . levalbuterol (XOPENEX HFA) 45 MCG/ACT inhaler Inhale 1-2 puffs into the lungs every 4 (four) hours as needed.        . metolazone (ZAROXOLYN) 5 MG tablet Take 1 tablet on Tuesday and 1 on Thursday       . potassium chloride SA (K-DUR,KLOR-CON) 20 MEQ tablet Take 20 mEq by mouth 2 (two) times daily.        Marland Kitchen  tiotropium (SPIRIVA) 18 MCG inhalation capsule Place 18 mcg into inhaler and inhale daily.        Marland Kitchen triamcinolone (NASACORT) 55 MCG/ACT nasal inhaler 2 sprays by Nasal route daily.        Marland Kitchen DISCONTD: bisoprolol (ZEBETA) 5 MG tablet 2 tablets in am and 2 in pm       . DISCONTD: diltiazem (CARDIZEM CD) 180 MG 24 hr capsule Take 180 mg by mouth daily.        Marland Kitchen DISCONTD: metFORMIN (GLUCOPHAGE) 500 MG tablet 3 tablets daily with supper       . DISCONTD: sertraline (ZOLOFT) 100 MG tablet Take 100 mg by mouth daily.        Marland Kitchen DISCONTD: simvastatin (ZOCOR) 20 MG tablet Take 20 mg by mouth at bedtime.        Marland Kitchen DISCONTD: warfarin (COUMADIN) 6 MG tablet Take 6 mg by mouth daily. As directed by coumadin clinic          Andres Bantz Pager:  [phone] 05/21/2011, 4:05 PM

## 2011-05-21 NOTE — Assessment & Plan Note (Signed)
He uses spiriva and xopenex prn.

## 2011-05-21 NOTE — Assessment & Plan Note (Signed)
Continue BPAP and 5 liters oxygen.   

## 2011-05-21 NOTE — Assessment & Plan Note (Signed)
2nd to OSA/OHS, COPD, and chronic left pleural effusion.  He has continued oxygen desaturation at rest on room air.  He is to continue 5 liters oxygen 24/7.

## 2011-09-22 DIAGNOSIS — M6281 Muscle weakness (generalized): Secondary | ICD-10-CM

## 2012-03-12 ENCOUNTER — Ambulatory Visit (INDEPENDENT_AMBULATORY_CARE_PROVIDER_SITE_OTHER): Payer: Medicare Other | Admitting: Urology

## 2012-03-12 DIAGNOSIS — N402 Nodular prostate without lower urinary tract symptoms: Secondary | ICD-10-CM

## 2012-06-01 ENCOUNTER — Ambulatory Visit (INDEPENDENT_AMBULATORY_CARE_PROVIDER_SITE_OTHER): Payer: Medicare Other | Admitting: Pulmonary Disease

## 2012-06-01 ENCOUNTER — Encounter: Payer: Self-pay | Admitting: Pulmonary Disease

## 2012-06-01 ENCOUNTER — Ambulatory Visit (INDEPENDENT_AMBULATORY_CARE_PROVIDER_SITE_OTHER)
Admission: RE | Admit: 2012-06-01 | Discharge: 2012-06-01 | Disposition: A | Discharge status: Home / Self Care | Payer: Medicare Other | Source: Ambulatory Visit | Attending: Pulmonary Disease | Admitting: Pulmonary Disease

## 2012-06-01 VITALS — BP 118/76 | HR 108 | Temp 96.8°F | Ht 68.0 in | Wt 296.8 lb

## 2012-06-01 DIAGNOSIS — J90 Pleural effusion, not elsewhere classified: Secondary | ICD-10-CM

## 2012-06-01 DIAGNOSIS — J449 Chronic obstructive pulmonary disease, unspecified: Secondary | ICD-10-CM

## 2012-06-01 DIAGNOSIS — J4489 Other specified chronic obstructive pulmonary disease: Secondary | ICD-10-CM

## 2012-06-01 DIAGNOSIS — R0600 Dyspnea, unspecified: Secondary | ICD-10-CM | POA: Insufficient documentation

## 2012-06-01 DIAGNOSIS — R0989 Other specified symptoms and signs involving the circulatory and respiratory systems: Secondary | ICD-10-CM

## 2012-06-01 DIAGNOSIS — J9610 Chronic respiratory failure, unspecified whether with hypoxia or hypercapnia: Secondary | ICD-10-CM

## 2012-06-01 DIAGNOSIS — G4733 Obstructive sleep apnea (adult) (pediatric): Secondary | ICD-10-CM

## 2012-06-01 DIAGNOSIS — R0609 Other forms of dyspnea: Secondary | ICD-10-CM

## 2012-06-01 DIAGNOSIS — E678 Other specified hyperalimentation: Secondary | ICD-10-CM

## 2012-06-01 NOTE — Assessment & Plan Note (Signed)
He is compliant with BiPAP and reports benefit.  Will get copy of his BiPAP report and call him with results.

## 2012-06-01 NOTE — Assessment & Plan Note (Signed)
Continue BPAP and 5 liters oxygen.

## 2012-06-01 NOTE — Assessment & Plan Note (Signed)
This has been present for years, and stable.  Likely contributing to his dyspnea with lung restriction, but little options at this point as he is not a candidate for surgical intervention.  Will continue to monitor clinically. 

## 2012-06-01 NOTE — Patient Instructions (Signed)
Will get report from BiPAP machine Use spiriva one puff daily Xopenex two puffs as needed for coughing, wheezing, or shortness of breath Follow up in 4 months >> if feeling better, then can reschedule follow up for 1 year

## 2012-06-01 NOTE — Progress Notes (Signed)
Chief Complaint  Patient presents with  . Follow-up    Pt states he wears  BIPAP w/ 5 liters O2 everynight. denies any problems w/ mask/machine. Pt states he stays in the bed all the time.     History of Present Illness: [name] [name] is a 72 y.o. male former smoker with COPD, OSA, OHS, hypoxemia, and chronic pleural effusion.  He has noticed more trouble with his breathing.  He gets winded with minimal exertion.  He has pulse oximetry meter at home, and his oxygen levels have been above 90%.    He uses xopenex several times per week, and this helps.  He is not using spiriva on a regular basis.  He is not having sputum, fever, hemoptysis, or chest pain.  He uses his BiPAP every night and this helps.  He sometimes uses this during the day when he has more trouble with his breathing.  TESTS: Echo 03/08/07 >> EF 55%, mild diastolic dysfx, mild/mod LA/RA dilation, mild MR, mild TR, PAS 25 mmHg Spirometry 06/01/12 >> FEV1 1.52 (58%), FEV1% 70; mixed obstructive/restrictive defect  [name] [name]  has a past medical history of Unspecified essential hypertension; Type II or unspecified type diabetes mellitus without mention of complication, not stated as uncontrolled; Esophageal reflux; Gout; Depressive disorder, not elsewhere classified; OSA (obstructive sleep apnea); COPD (chronic obstructive pulmonary disease); Respiratory failure; Atrial fibrillation; and BPH (benign prostatic hyperplasia).  [name] [name]  has past surgical history that includes TURP vaporization; Cholecystectomy; Tonsillectomy; and Inguinal hernia repair.  Prior to Admission medications   Medication Sig Start Date End Date Taking? Authorizing Provider  allopurinol (ZYLOPRIM) 300 MG tablet Take 300 mg by mouth daily.     Yes Historical Provider, MD  dextromethorphan-guaiFENesin (MUCINEX DM) 30-600 MG per 12 hr tablet Take 1 tablet by mouth every 12 (twelve) hours.     Yes Historical Provider, MD  dutasteride (AVODART) 0.5  MG capsule Take 0.5 mg by mouth daily.     Yes Historical Provider, MD  esomeprazole (NEXIUM) 40 MG capsule Take 40 mg by mouth daily before breakfast.     Yes Historical Provider, MD  furosemide (LASIX) 40 MG tablet 2 tablets daily   Yes Historical Provider, MD  insulin aspart protamine-insulin aspart (NOVOLOG 70/30) (70-30) 100 UNIT/ML injection Inject into the skin. 60 units before meals   Yes Historical Provider, MD  insulin glargine (LANTUS) 100 UNIT/ML injection 75 in the am and 75 at bedtime   Yes Historical Provider, MD  levalbuterol (XOPENEX HFA) 45 MCG/ACT inhaler Inhale 1-2 puffs into the lungs every 4 (four) hours as needed.     Yes Historical Provider, MD  metolazone (ZAROXOLYN) 5 MG tablet Take 1 tablet on Tuesday and 1 on Thursday    Yes Historical Provider, MD  potassium chloride SA (K-DUR,KLOR-CON) 20 MEQ tablet Take 10 mEq by mouth 2 (two) times daily.    Yes Historical Provider, MD  tiotropium (SPIRIVA) 18 MCG inhalation capsule Place 18 mcg into inhaler and inhale daily.     Yes Historical Provider, MD  triamcinolone (NASACORT) 55 MCG/ACT nasal inhaler 2 sprays by Nasal route daily.     Yes Historical Provider, MD    Allergies  Allergen Reactions  . Cefaclor   . Cephalexin   . Sulfamethoxazole W-Trimethoprim      Physical Exam:  General - No distress, obese ENT - No sinus tenderness, MP 4, no oral exudate, no LAN Cardiac - s1s2 regular, no murmur Chest - Decreased breaths  sounds Lt side, prolonged exhalation, no wheeze Back - No focal tenderness Abd - Soft, non-tender Ext - 1+ ankle edema Neuro - Normal strength Skin - No rashes Psych - normal mood, and behavior  Dg Chest 2 View  06/01/2012   *RADIOLOGY REPORT*  Clinical Data: Left pleural effusion, shortness of breath.  CHEST - 2 VIEW  Comparison: Radiograph of September 22, 2011; chest CT of July 01, 2007.  Findings: Stable cardiomediastinal silhouette.  Right lung is clear.  No pneumothorax or pleural effusion  is noted.  Moderate size hiatal hernia is noted.  Continued presence of density is seen involving the left lower lung field which is unchanged compared to prior exam and most likely represents prominent epicardial fat based on prior CT scan of 2009.  IMPRESSION: No acute cardiopulmonary abnormality seen.   Original Report Authenticated By: Lupita Raider.,  M.D.    Assessment/Plan:  Coralyn Helling, MD [facility]/Sleep Pager:  [phone]

## 2012-06-01 NOTE — Assessment & Plan Note (Signed)
Advised him to start using spiriva on a daily basis and xopenex as needed.

## 2012-06-01 NOTE — Assessment & Plan Note (Signed)
He is to continue supplemental oxygen and BiPAP at night.

## 2012-06-01 NOTE — Assessment & Plan Note (Signed)
Most likely related to COPD, obesity with deconditioning, diastolic heart failure, restrictive lung defect from chronic pleural effusion and obesity, and chronic hypoxic/hypercapneic respiratory failure.

## 2012-06-08 ENCOUNTER — Telehealth: Payer: Self-pay | Admitting: Pulmonary Disease

## 2012-06-08 NOTE — Telephone Encounter (Signed)
LMOM TCB x1 for Pat Has Alida received a CMN? Has Mindy seen CMN for VS? Routing to the above

## 2012-06-09 ENCOUNTER — Telehealth: Payer: Self-pay | Admitting: Pulmonary Disease

## 2012-06-09 NOTE — Telephone Encounter (Signed)
BiPAP 12/05/11 to 06/01/12 >> Used on 180 of 180 nights with average 21 hrs?  Average AHI 1.5 with BiPAP 12/8 cm H2O.  Will have my nurse inform pt that BiPAP report looks good.  No change to current set up needed.

## 2012-06-10 NOTE — Telephone Encounter (Signed)
I have not. Please advise Alida thanks

## 2012-06-10 NOTE — Telephone Encounter (Signed)
Spoke with Dennie Bible @ [facility], I do not have cmn, she is refaxing to [facility] # once signed I will fax back .Kandice Hams

## 2012-06-10 NOTE — Telephone Encounter (Signed)
I spoke with patient about results and he verbalized understanding and had no questions 

## 2012-06-10 NOTE — Telephone Encounter (Signed)
Pat from [facility] called back, disregard this request. She has signed cmn for this patient. Kandice Hams

## 2012-09-01 ENCOUNTER — Encounter: Payer: Self-pay | Admitting: *Deleted

## 2012-09-02 ENCOUNTER — Encounter: Payer: Self-pay | Admitting: Cardiovascular Disease

## 2012-09-03 ENCOUNTER — Encounter: Payer: Self-pay | Admitting: Cardiovascular Disease

## 2012-09-03 ENCOUNTER — Ambulatory Visit (INDEPENDENT_AMBULATORY_CARE_PROVIDER_SITE_OTHER): Payer: Medicare Other | Admitting: Cardiovascular Disease

## 2012-09-03 VITALS — BP 142/64 | HR 100 | Ht 68.0 in | Wt 296.5 lb

## 2012-09-03 DIAGNOSIS — I4891 Unspecified atrial fibrillation: Secondary | ICD-10-CM

## 2012-09-03 DIAGNOSIS — I4821 Permanent atrial fibrillation: Secondary | ICD-10-CM

## 2012-09-03 DIAGNOSIS — I2729 Other secondary pulmonary hypertension: Secondary | ICD-10-CM

## 2012-09-03 DIAGNOSIS — IMO0002 Reserved for concepts with insufficient information to code with codable children: Secondary | ICD-10-CM

## 2012-09-03 DIAGNOSIS — I2789 Other specified pulmonary heart diseases: Secondary | ICD-10-CM

## 2012-09-03 DIAGNOSIS — I509 Heart failure, unspecified: Secondary | ICD-10-CM

## 2012-09-03 NOTE — Patient Instructions (Addendum)
Your physician recommends that you schedule a follow-up appointment in: One year.  

## 2012-09-13 DIAGNOSIS — I2729 Other secondary pulmonary hypertension: Secondary | ICD-10-CM | POA: Insufficient documentation

## 2012-09-13 DIAGNOSIS — I4821 Permanent atrial fibrillation: Secondary | ICD-10-CM | POA: Insufficient documentation

## 2012-09-13 DIAGNOSIS — IMO0002 Reserved for concepts with insufficient information to code with codable children: Secondary | ICD-10-CM | POA: Insufficient documentation

## 2012-09-13 NOTE — Progress Notes (Signed)
Patient ID: [name] [name], male   DOB: 1940-05-05, 72 y.o.   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 He is on chronic oxygen. Other problems include type II that these mellitus, gout, diverticulosis and type 2 diabetes mellitus. He has normal coronary arteries by angiography in 2008. He had been on treatment with warfarin but as of last year he simply refuses to take it anymore. He was convinced that this medication was making him feel weak and fatigued and decided to discontinue it. Of note he has never had a serious bleeding problem unaware of. He is taking aspirin for stroke prevention.  He is always short of breath. He is extremely sedentary. He denies chest pain or syncope. He is unaware of any palpitations. He has bilateral lower extremity edema, although he is convinced that the left leg is worse.  He weighs roughly the same as he did last year. He requires combination therapy with daily furosemide and twice weekly metolazone to keep the edema at a manageable level.    Allergies  Allergen Reactions  . Cefaclor   . Cephalexin   . Sulfamethoxazole W-Trimethoprim     Current Outpatient Prescriptions  Medication Sig Dispense Refill  . allopurinol (ZYLOPRIM) 300 MG tablet Take 300 mg by mouth daily.        Marland Kitchen aspirin 81 MG tablet Take 243 mg by mouth daily.      Marland Kitchen dextromethorphan-guaiFENesin (MUCINEX DM) 30-600 MG per 12 hr tablet Take 1 tablet by mouth every 12 (twelve) hours.        . dutasteride (AVODART) 0.5 MG capsule Take 0.5 mg by mouth daily.        Marland Kitchen esomeprazole (NEXIUM) 40 MG capsule Take 40 mg by mouth daily before breakfast.        . furosemide (LASIX) 40 MG tablet 2 tablets daily      . insulin aspart protamine-insulin aspart (NOVOLOG  70/30) (70-30) 100 UNIT/ML injection Inject into the skin. 60 units before meals      . insulin glargine (LANTUS) 100 UNIT/ML injection 75 in the am and 75 at bedtime      . levalbuterol (XOPENEX HFA) 45 MCG/ACT inhaler Inhale 1-2 puffs into the lungs every 4 (four) hours as needed.        . metolazone (ZAROXOLYN) 5 MG tablet Take 1 tablet on Tuesday and 1 on Thursday       . OXYGEN-HELIUM IN Inhale 3-5 L into the lungs continuous.      . potassium chloride SA (K-DUR,KLOR-CON) 20 MEQ tablet Take 10 mEq by mouth 2 (two) times daily.       Marland Kitchen tiotropium (SPIRIVA) 18 MCG inhalation capsule Place 18 mcg into inhaler and inhale daily.        Marland Kitchen triamcinolone (NASACORT) 55 MCG/ACT nasal inhaler 2 sprays by Nasal route daily.         No current facility-administered medications for this visit.    Past Medical History  Diagnosis Date  . Unspecified essential hypertension   . Type II or unspecified type diabetes mellitus without mention of complication, not stated as uncontrolled   . Esophageal reflux   . Gout   . Depressive disorder, not elsewhere classified   . OSA (obstructive sleep apnea)     BiPAP  .  COPD (chronic obstructive pulmonary disease)   . Respiratory failure   . Atrial fibrillation   . BPH (benign prostatic hyperplasia)   . Pleural effusion, left 06/14/2007        . Pulmonary hypertension   . Cor pulmonale     R heart failure  . Diverticulosis   . CAD (coronary artery disease)     non-obsctructive   . Obesity   . History of nuclear stress test 06/18/2006    [facility]; normal myoview with diminished oxygen sats     Past Surgical History  Procedure Laterality Date  . Turp vaporization    . Cholecystectomy    . Tonsillectomy    . Inguinal hernia repair    . Transthoracic echocardiogram  03/08/2007    EF 55%; mild diastolic dysfunction; mild aortic valve stenosis with trivial AV regurg; mild MR; LA mild-mod dilated; RV mildly dilated; trivial pulm regurg; mild TR; RA mil-mod  dilated  . Cardiac catheterization  08/25/2006    mild pulm htn; mild atherosclerosis but no significant CAD    Family History  Problem Relation Age of Onset  . Prostate cancer Father     History   Social History  . Marital Status: Married    Spouse Name: N/A    Number of Children: N/A  . Years of Education: N/A   Occupational History  . Retired    Social History Main Topics  . Smoking status: Never Smoker   . Smokeless tobacco: Not on file     Comment: Remote history of tobacco history  . Alcohol Use: No  . Drug Use: No  . Sexual Activity: Not on file   Other Topics Concern  . Not on file   Social History Narrative  . No narrative on file    Review of systems: The patient specifically denies any chest pain at rest or with exertion, orthopnea, paroxysmal nocturnal dyspnea, syncope, palpitations, focal neurological deficits, intermittent claudication, cough, hemoptysis or wheezing.  He has a large umbilical hernia that might be incarcerated but does not appear strangulated. It is not painful or discolored. It has not changed much according to his report. To me it looks considerably better than it did last year  The patient also denies abdominal pain, nausea, vomiting, dysphagia, diarrhea, constipation, polyuria, polydipsia, dysuria, hematuria, frequency, urgency, abnormal bleeding or bruising, fever, chills, unexpected weight changes, mood swings, change in skin or hair texture, change in voice quality, auditory or visual problems, allergic reactions or rashes, new musculoskeletal complaints other than usual "aches and pains".   PHYSICAL EXAM BP 142/64  Pulse 100  Ht 5\' 8"  (1.727 m)  Wt 296 lb 8 oz (134.492 kg)  BMI 45.09 kg/m2 Morbidly obese, somnolent, cyanotic lips and tip of his nose  General: Alert, oriented x3, no distress Head: no evidence of trauma, PERRL, EOMI, no exophtalmos or lid lag, no myxedema, no xanthelasma; normal ears, nose and oropharynx Neck:  Very difficult to evaluate the jugular venous pulsations and hepatojugular reflux; brisk carotid pulses without delay and no carotid bruits Chest: clear to auscultation, no signs of consolidation by percussion or palpation, normal fremitus, symmetrical and full respiratory excursions Cardiovascular: normal position and quality of the apical impulse, irregular rhythm, normal first and second heart sounds, no rubs or gallops, grade 1/6 holosystolic murmur at the left lower sternal border Abdomen: no tenderness or distention, no masses by palpation, no abnormal pulsatility or arterial bruits, normal bowel sounds, no hepatosplenomegaly; roughly 8-9 cm broad rim umbilical  hernia is not reducible but does not appear to be strangulated. I do not hear bowel sounds in it. It may have only fat and omental tissue Extremities: no clubbing, cyanosis; 2+ bilateral ankle and pedal edema; I cannot locate pulses in the inferior extremities Neurological: grossly nonfocal, sleepy but readily arousable and oriented    EKG:  atrial fibrillation with controlled rate generalized low-voltage minor intraventricular conduction delay nonspecific ST-T wave changes   Lipid Panel     Component Value Date/Time   CHOL  Value: 135        ATP III CLASSIFICATION:  <200     mg/dL   Desirable  960-454  mg/dL   Borderline High  >=098    mg/dL   High 02/01/1476 2956   TRIG 141 05/10/2007 0400   HDL 32* 05/10/2007 0400   CHOLHDL 4.2 05/10/2007 0400   VLDL 28 05/10/2007 0400   LDLCALC  Value: 75        Total Cholesterol/HDL:CHD Risk Coronary Heart Disease Risk Table                     Men   Women  1/2 Average Risk   3.4   3.3 05/10/2007 0400    BMET    Component Value Date/Time   NA 143 07/05/2007 0500   K 3.7 07/05/2007 0500   CL 93* 07/05/2007 0500   CO2 41* 07/05/2007 0500   GLUCOSE 157* 07/05/2007 0500   BUN 22 07/05/2007 0500   CREATININE 0.84 07/05/2007 0500   CALCIUM 9.4 07/05/2007 0500   GFRNONAA >60 07/05/2007 0500   GFRAA   Value: >60        The eGFR has been calculated using the MDRD equation. This calculation has not been validated in all clinical 07/05/2007 0500     ASSESSMENT AND PLAN  There is little change in his cardiovascular status from last year. His cardiac problems can all be traced back to morbid obesity, Pickwick syndrome and secondary pulmonary hypertension heart failure. He is at high-risk of embolic events and stroke since he refuses to take anticoagulants. He will continue taking aspirin. He is not interested in the novel anticoagulants either. He is doing a reasonably good job of managing his edema with combination diuretic therapy. Poorly has had normal lab work recently performed a refill. Unfortunately, i don't have a copy of these.  Note spontaneously well rate-controlled atrial fibrillation despite the absence of AV node blocking agents. This suggests some degree of underlying conduction system disease which is also supported by his slightly abnormal QRS complex. There is no indication of symptomatic bradycardia her need for pacemaker therapy at this time  He will followup on a yearly basis  Orders Placed This Encounter  Procedures  . EKG 12-Lead     Anthony Roland  Thurmon Fair, MD, [facility] [phone] office [phone] pager

## 2012-10-21 ENCOUNTER — Ambulatory Visit (INDEPENDENT_AMBULATORY_CARE_PROVIDER_SITE_OTHER): Payer: Medicare Other | Admitting: Pulmonary Disease

## 2012-10-21 ENCOUNTER — Encounter: Payer: Self-pay | Admitting: Pulmonary Disease

## 2012-10-21 VITALS — BP 110/66 | HR 105 | Ht 68.0 in | Wt 294.0 lb

## 2012-10-21 DIAGNOSIS — R0609 Other forms of dyspnea: Secondary | ICD-10-CM

## 2012-10-21 DIAGNOSIS — R0600 Dyspnea, unspecified: Secondary | ICD-10-CM

## 2012-10-21 DIAGNOSIS — J9610 Chronic respiratory failure, unspecified whether with hypoxia or hypercapnia: Secondary | ICD-10-CM

## 2012-10-21 DIAGNOSIS — E678 Other specified hyperalimentation: Secondary | ICD-10-CM

## 2012-10-21 DIAGNOSIS — G4733 Obstructive sleep apnea (adult) (pediatric): Secondary | ICD-10-CM

## 2012-10-21 DIAGNOSIS — J449 Chronic obstructive pulmonary disease, unspecified: Secondary | ICD-10-CM

## 2012-10-21 NOTE — Assessment & Plan Note (Signed)
Continue supplemental oxygen with BiPAP.  Advised him to try gradually increasing his exercise as tolerated.

## 2012-10-21 NOTE — Assessment & Plan Note (Signed)
He is compliant with BiPAP and reports benefit.   

## 2012-10-21 NOTE — Assessment & Plan Note (Signed)
He is to continue with spiriva and prn xopenex.

## 2012-10-21 NOTE — Assessment & Plan Note (Signed)
Continue BPAP and 5 liters oxygen.

## 2012-10-21 NOTE — Progress Notes (Signed)
Chief Complaint  Patient presents with  . COPD    Breathing is unchanged.   . Sleep Apnea    Currently using CPAP machine every night. Feels well rested after using. Denies problems with machine, mask or pressure.    History of Present Illness: [name] [name] is a 72 y.o. male former smoker with COPD, OSA, OHS, hypoxemia, and chronic pleural effusion.  His breathing has been doing okay.  He gets winded with activity.  He tried doing more walking, but had trouble with the heat.  He is planning to start up again now that the weather is better.  He is not having much cough, wheeze, or congeston.    He is using his BiPAP and this helps.  TESTS: Echo 03/08/07 >> EF 55%, mild diastolic dysfx, mild/mod LA/RA dilation, mild MR, mild TR, PAS 25 mmHg Spirometry 06/01/12 >> FEV1 1.52 (58%), FEV1% 70; mixed obstructive/restrictive defect BiPAP 12/05/11 to 06/01/12 >> Used on 180 of 180 nights with average 21 hrs? Average AHI 1.5 with BiPAP 12/8 cm H2O.  [name] [name]  has a past medical history of Unspecified essential hypertension; Type II or unspecified type diabetes mellitus without mention of complication, not stated as uncontrolled; Esophageal reflux; Gout; Depressive disorder, not elsewhere classified; OSA (obstructive sleep apnea); COPD (chronic obstructive pulmonary disease); Respiratory failure; Atrial fibrillation; BPH (benign prostatic hyperplasia); Pleural effusion, left (06/14/2007); Pulmonary hypertension; Cor pulmonale; Diverticulosis; CAD (coronary artery disease); Obesity; and History of nuclear stress test (06/18/2006).  [name] [name]  has past surgical history that includes TURP vaporization; Cholecystectomy; Tonsillectomy; Inguinal hernia repair; transthoracic echocardiogram (03/08/2007); and Cardiac catheterization (08/25/2006).  Prior to Admission medications   Medication Sig Start Date End Date Taking? Authorizing Provider  allopurinol (ZYLOPRIM) 300 MG tablet Take 300 mg by mouth  daily.     Yes Historical Provider, MD  dextromethorphan-guaiFENesin (MUCINEX DM) 30-600 MG per 12 hr tablet Take 1 tablet by mouth every 12 (twelve) hours.     Yes Historical Provider, MD  dutasteride (AVODART) 0.5 MG capsule Take 0.5 mg by mouth daily.     Yes Historical Provider, MD  esomeprazole (NEXIUM) 40 MG capsule Take 40 mg by mouth daily before breakfast.     Yes Historical Provider, MD  furosemide (LASIX) 40 MG tablet 2 tablets daily   Yes Historical Provider, MD  insulin aspart protamine-insulin aspart (NOVOLOG 70/30) (70-30) 100 UNIT/ML injection Inject into the skin. 60 units before meals   Yes Historical Provider, MD  insulin glargine (LANTUS) 100 UNIT/ML injection 75 in the am and 75 at bedtime   Yes Historical Provider, MD  levalbuterol (XOPENEX HFA) 45 MCG/ACT inhaler Inhale 1-2 puffs into the lungs every 4 (four) hours as needed.     Yes Historical Provider, MD  metolazone (ZAROXOLYN) 5 MG tablet Take 1 tablet on Tuesday and 1 on Thursday    Yes Historical Provider, MD  potassium chloride SA (K-DUR,KLOR-CON) 20 MEQ tablet Take 10 mEq by mouth 2 (two) times daily.    Yes Historical Provider, MD  tiotropium (SPIRIVA) 18 MCG inhalation capsule Place 18 mcg into inhaler and inhale daily.     Yes Historical Provider, MD  triamcinolone (NASACORT) 55 MCG/ACT nasal inhaler 2 sprays by Nasal route daily.     Yes Historical Provider, MD    Allergies  Allergen Reactions  . Cefaclor   . Cephalexin   . Sulfamethoxazole-Trimethoprim      Physical Exam:  General - No distress, obese, in wheelchair ENT -  No sinus tenderness, MP 4, no oral exudate, no LAN Cardiac - s1s2 regular, no murmur Chest - Decreased breaths sounds Lt side, prolonged exhalation, no wheeze Back - No focal tenderness Abd - Soft, non-tender Ext - minimal ankle edema Neuro - Normal strength Skin - No rashes Psych - normal mood, and behavior  Assessment/Plan:  Coralyn Helling, MD [place]  [facility]/Sleep Pager:  [phone]

## 2012-10-21 NOTE — Patient Instructions (Signed)
Follow up in 6 months 

## 2012-10-21 NOTE — Assessment & Plan Note (Signed)
Most likely related to COPD, obesity with deconditioning, diastolic heart failure, restrictive lung defect from chronic pleural effusion and obesity, and chronic hypoxic/hypercapneic respiratory failure.

## 2013-09-13 ENCOUNTER — Telehealth: Payer: Self-pay | Admitting: Pulmonary Disease

## 2013-09-13 DIAGNOSIS — J9610 Chronic respiratory failure, unspecified whether with hypoxia or hypercapnia: Secondary | ICD-10-CM

## 2013-09-13 DIAGNOSIS — G4733 Obstructive sleep apnea (adult) (pediatric): Secondary | ICD-10-CM

## 2013-09-13 NOTE — Telephone Encounter (Signed)
Spoke with pt's wife.  Reports bipap broke last night.  She called [facility] (current DME) to see about having this repaired/replaced.  Was advised they would need to change DME to [facility] d/t insurance.  Requesting order be placed.  Order placed to change DME to [facility] to eval/repair/replace Bipap machine.  Wife aware.

## 2013-09-14 ENCOUNTER — Telehealth: Payer: Self-pay | Admitting: Pulmonary Disease

## 2013-09-14 DIAGNOSIS — G4733 Obstructive sleep apnea (adult) (pediatric): Secondary | ICD-10-CM

## 2013-09-14 NOTE — Telephone Encounter (Signed)
He is on BiPAP 12/8 cm H2O.  He needs heated humidity for his machine.

## 2013-09-14 NOTE — Telephone Encounter (Signed)
Patient's wife is calling stating [facility] states they still have not received order for bipap.  [phone]

## 2013-09-14 NOTE — Telephone Encounter (Signed)
Called spoke with Makemie Park from [facility]. They are needing BIPAP settings for pt BIPAP. He is aslo pulling pt sleep study from chart to see if this works. Please advise VS thanks

## 2013-09-14 NOTE — Telephone Encounter (Signed)
Called and spoke with Barbara Cower from [facility] and he is aware of order that has been placed for the BIPAP for the pt.

## 2013-12-02 ENCOUNTER — Encounter: Payer: Self-pay | Admitting: Pulmonary Disease

## 2013-12-02 ENCOUNTER — Ambulatory Visit (INDEPENDENT_AMBULATORY_CARE_PROVIDER_SITE_OTHER): Payer: Medicare Other | Admitting: Pulmonary Disease

## 2013-12-02 VITALS — BP 108/60 | HR 122 | Temp 97.6°F | Ht 68.0 in | Wt 286.0 lb

## 2013-12-02 DIAGNOSIS — J90 Pleural effusion, not elsewhere classified: Secondary | ICD-10-CM

## 2013-12-02 DIAGNOSIS — G4733 Obstructive sleep apnea (adult) (pediatric): Secondary | ICD-10-CM

## 2013-12-02 DIAGNOSIS — J449 Chronic obstructive pulmonary disease, unspecified: Secondary | ICD-10-CM

## 2013-12-02 DIAGNOSIS — J948 Other specified pleural conditions: Secondary | ICD-10-CM

## 2013-12-02 DIAGNOSIS — E662 Morbid (severe) obesity with alveolar hypoventilation: Secondary | ICD-10-CM

## 2013-12-02 NOTE — Patient Instructions (Signed)
Will get report from BiPAP machine and call with results Follow up in 1 year

## 2013-12-02 NOTE — Assessment & Plan Note (Signed)
Continue supplemental oxygen with BiPAP. 

## 2013-12-02 NOTE — Progress Notes (Signed)
Chief Complaint  Patient presents with  . Follow-up    OSA and COPD>> Pt reports some increased SOB at times, requiring use of O2 during daytime. Using Bipap nightly and during naps. Denies problems with mask/pressure.     History of Present Illness: [name] [name] is a 73 y.o. male former smoker with COPD, OSA, OHS, hypoxemia, and chronic pleural effusion.  He got a new BiPAP machine.  He is using his BiPAP and this helps.  He does not have any difficulty with mask fit.    He uses oxygen at night, and sometimes during the day.  He gets occasional cough and congestion.  He will use either xopenex or spiriva about once per week.  He is not very active.  TESTS: Echo 03/08/07 >> EF 55%, mild diastolic dysfx, mild/mod LA/RA dilation, mild MR, mild TR, PAS 25 mmHg Spirometry 06/01/12 >> FEV1 1.52 (58%), FEV1% 70; mixed obstructive/restrictive defect BiPAP 12/05/11 to 06/01/12 >> Used on 180 of 180 nights with average 21 hrs? Average AHI 1.5 with BiPAP 12/8 cm H2O. BiPAP 09/28/13 to 10/27/13 >> used on 30 of 30 nights with average 14 hrs and 29 min.  Average AHI is 1.7 with BiPAP 12/8 cm H2O.  PMHx, PSHx, Medications, Allergies, Fhx, Shx reviewed.  Physical Exam:  General - No distress, obese, in wheelchair ENT - No sinus tenderness, MP 4, no oral exudate, no LAN Cardiac - irregular, no murmur Chest - Decreased breaths sounds Lt side, prolonged exhalation, no wheeze Back - No focal tenderness Abd - Soft, non-tender Ext - 1+ edema Neuro - Normal strength Skin - No rashes Psych - normal mood, and behavior  Assessment/Plan:  Coralyn HellingVineet Helana Macbride, MD [place] [facility]Critical [facility]/Sleep Pager:  [phone]

## 2013-12-02 NOTE — Assessment & Plan Note (Signed)
This has been present for years, and stable.  Likely contributing to his dyspnea with lung restriction, but little options at this point as he is not a candidate for surgical intervention.  Will continue to monitor clinically.

## 2013-12-02 NOTE — Assessment & Plan Note (Signed)
He can use xopenex prn.

## 2013-12-02 NOTE — Assessment & Plan Note (Signed)
He is compliant with therapy and reports benefit.  Will get copy of his download and call him with results.

## 2013-12-21 ENCOUNTER — Telehealth: Payer: Self-pay | Admitting: Pulmonary Disease

## 2013-12-21 NOTE — Telephone Encounter (Signed)
BiPAP 11/07/13 to 12/06/13 >> used on 30 of 30 nights with average 14 hrs and 29 min.  Average AHI is 6 with BiPAP 12/8 cm H2O.   Will have my nurse inform pt that BiPAP report looks very good.  No change to current set up.

## 2013-12-26 ENCOUNTER — Telehealth: Payer: Self-pay | Admitting: Pulmonary Disease

## 2013-12-26 NOTE — Telephone Encounter (Signed)
LMTCB x 1 

## 2013-12-26 NOTE — Telephone Encounter (Signed)
Duplicate message. See phone note 12/21/13

## 2013-12-26 NOTE — Telephone Encounter (Signed)
I spoke with patient about results and he verbalized understanding and had no questions 

## 2014-01-04 ENCOUNTER — Telehealth: Payer: Self-pay | Admitting: Pulmonary Disease

## 2014-01-04 MED ORDER — LEVALBUTEROL TARTRATE 45 MCG/ACT IN AERO: 1.0000 | INHALATION_SPRAY | RESPIRATORY_TRACT | Status: AC | PRN

## 2014-01-04 NOTE — Telephone Encounter (Signed)
Pt aware that Xopenex has been sent to [facility]. Nothing further needed.

## 2014-09-22 ENCOUNTER — Ambulatory Visit (INDEPENDENT_AMBULATORY_CARE_PROVIDER_SITE_OTHER): Payer: Medicare Other | Admitting: Urology

## 2014-09-22 ENCOUNTER — Other Ambulatory Visit: Payer: Self-pay | Admitting: Urology

## 2014-09-22 DIAGNOSIS — N189 Chronic kidney disease, unspecified: Secondary | ICD-10-CM

## 2014-09-22 DIAGNOSIS — N289 Disorder of kidney and ureter, unspecified: Secondary | ICD-10-CM

## 2014-09-25 ENCOUNTER — Ambulatory Visit (HOSPITAL_COMMUNITY): Payer: Medicare Other

## 2014-10-02 ENCOUNTER — Ambulatory Visit (HOSPITAL_COMMUNITY)
Admission: RE | Admit: 2014-10-02 | Discharge: 2014-10-02 | Disposition: A | Discharge status: Home / Self Care | Payer: Medicare Other | Source: Ambulatory Visit | Attending: Urology | Admitting: Urology

## 2014-10-02 DIAGNOSIS — N261 Atrophy of kidney (terminal): Secondary | ICD-10-CM | POA: Diagnosis not present

## 2014-10-02 DIAGNOSIS — R934 Abnormal findings on diagnostic imaging of urinary organs: Secondary | ICD-10-CM | POA: Insufficient documentation

## 2014-10-02 DIAGNOSIS — N189 Chronic kidney disease, unspecified: Secondary | ICD-10-CM | POA: Insufficient documentation

## 2014-10-02 DIAGNOSIS — N289 Disorder of kidney and ureter, unspecified: Secondary | ICD-10-CM

## 2015-01-12 ENCOUNTER — Encounter (HOSPITAL_COMMUNITY): Payer: Self-pay | Admitting: *Deleted

## 2015-01-12 ENCOUNTER — Emergency Department (HOSPITAL_COMMUNITY): Payer: Medicare Other

## 2015-01-12 ENCOUNTER — Other Ambulatory Visit: Payer: Self-pay

## 2015-01-12 ENCOUNTER — Inpatient Hospital Stay (HOSPITAL_COMMUNITY)
Admission: EM | Admit: 2015-01-12 | Discharge: 2015-02-14 | DRG: 175 | Disposition: E | Payer: Medicare Other | Attending: Internal Medicine | Admitting: Internal Medicine

## 2015-01-12 DIAGNOSIS — B370 Candidal stomatitis: Secondary | ICD-10-CM | POA: Diagnosis present

## 2015-01-12 DIAGNOSIS — R042 Hemoptysis: Secondary | ICD-10-CM | POA: Diagnosis present

## 2015-01-12 DIAGNOSIS — I4821 Permanent atrial fibrillation: Secondary | ICD-10-CM | POA: Diagnosis present

## 2015-01-12 DIAGNOSIS — J449 Chronic obstructive pulmonary disease, unspecified: Secondary | ICD-10-CM | POA: Diagnosis present

## 2015-01-12 DIAGNOSIS — I2729 Other secondary pulmonary hypertension: Secondary | ICD-10-CM | POA: Diagnosis present

## 2015-01-12 DIAGNOSIS — R0600 Dyspnea, unspecified: Secondary | ICD-10-CM | POA: Diagnosis not present

## 2015-01-12 DIAGNOSIS — N183 Chronic kidney disease, stage 3 (moderate): Secondary | ICD-10-CM | POA: Diagnosis present

## 2015-01-12 DIAGNOSIS — J9621 Acute and chronic respiratory failure with hypoxia: Secondary | ICD-10-CM | POA: Diagnosis present

## 2015-01-12 DIAGNOSIS — J9600 Acute respiratory failure, unspecified whether with hypoxia or hypercapnia: Secondary | ICD-10-CM | POA: Insufficient documentation

## 2015-01-12 DIAGNOSIS — I130 Hypertensive heart and chronic kidney disease with heart failure and stage 1 through stage 4 chronic kidney disease, or unspecified chronic kidney disease: Secondary | ICD-10-CM | POA: Diagnosis present

## 2015-01-12 DIAGNOSIS — J90 Pleural effusion, not elsewhere classified: Secondary | ICD-10-CM | POA: Diagnosis present

## 2015-01-12 DIAGNOSIS — T17990A Other foreign object in respiratory tract, part unspecified in causing asphyxiation, initial encounter: Secondary | ICD-10-CM | POA: Diagnosis present

## 2015-01-12 DIAGNOSIS — Z6837 Body mass index (BMI) 37.0-37.9, adult: Secondary | ICD-10-CM

## 2015-01-12 DIAGNOSIS — I272 Other secondary pulmonary hypertension: Secondary | ICD-10-CM | POA: Diagnosis present

## 2015-01-12 DIAGNOSIS — Y95 Nosocomial condition: Secondary | ICD-10-CM | POA: Diagnosis present

## 2015-01-12 DIAGNOSIS — I2510 Atherosclerotic heart disease of native coronary artery without angina pectoris: Secondary | ICD-10-CM | POA: Diagnosis present

## 2015-01-12 DIAGNOSIS — Z515 Encounter for palliative care: Secondary | ICD-10-CM | POA: Insufficient documentation

## 2015-01-12 DIAGNOSIS — I509 Heart failure, unspecified: Secondary | ICD-10-CM | POA: Diagnosis present

## 2015-01-12 DIAGNOSIS — K224 Dyskinesia of esophagus: Secondary | ICD-10-CM | POA: Diagnosis present

## 2015-01-12 DIAGNOSIS — G4733 Obstructive sleep apnea (adult) (pediatric): Secondary | ICD-10-CM | POA: Insufficient documentation

## 2015-01-12 DIAGNOSIS — Z888 Allergy status to other drugs, medicaments and biological substances status: Secondary | ICD-10-CM

## 2015-01-12 DIAGNOSIS — Z8042 Family history of malignant neoplasm of prostate: Secondary | ICD-10-CM

## 2015-01-12 DIAGNOSIS — J189 Pneumonia, unspecified organism: Secondary | ICD-10-CM | POA: Insufficient documentation

## 2015-01-12 DIAGNOSIS — Z66 Do not resuscitate: Secondary | ICD-10-CM | POA: Diagnosis present

## 2015-01-12 DIAGNOSIS — E662 Morbid (severe) obesity with alveolar hypoventilation: Secondary | ICD-10-CM | POA: Diagnosis present

## 2015-01-12 DIAGNOSIS — Z7982 Long term (current) use of aspirin: Secondary | ICD-10-CM

## 2015-01-12 DIAGNOSIS — K219 Gastro-esophageal reflux disease without esophagitis: Secondary | ICD-10-CM | POA: Diagnosis present

## 2015-01-12 DIAGNOSIS — I2699 Other pulmonary embolism without acute cor pulmonale: Principal | ICD-10-CM | POA: Diagnosis present

## 2015-01-12 DIAGNOSIS — Z7189 Other specified counseling: Secondary | ICD-10-CM | POA: Insufficient documentation

## 2015-01-12 DIAGNOSIS — R7989 Other specified abnormal findings of blood chemistry: Secondary | ICD-10-CM | POA: Diagnosis present

## 2015-01-12 DIAGNOSIS — F329 Major depressive disorder, single episode, unspecified: Secondary | ICD-10-CM | POA: Diagnosis present

## 2015-01-12 DIAGNOSIS — E876 Hypokalemia: Secondary | ICD-10-CM | POA: Diagnosis present

## 2015-01-12 DIAGNOSIS — R1319 Other dysphagia: Secondary | ICD-10-CM | POA: Diagnosis present

## 2015-01-12 DIAGNOSIS — M109 Gout, unspecified: Secondary | ICD-10-CM | POA: Diagnosis present

## 2015-01-12 DIAGNOSIS — R0902 Hypoxemia: Secondary | ICD-10-CM

## 2015-01-12 DIAGNOSIS — N179 Acute kidney failure, unspecified: Secondary | ICD-10-CM | POA: Diagnosis present

## 2015-01-12 DIAGNOSIS — I2781 Cor pulmonale (chronic): Secondary | ICD-10-CM | POA: Diagnosis present

## 2015-01-12 DIAGNOSIS — R778 Other specified abnormalities of plasma proteins: Secondary | ICD-10-CM | POA: Diagnosis present

## 2015-01-12 DIAGNOSIS — J690 Pneumonitis due to inhalation of food and vomit: Secondary | ICD-10-CM | POA: Insufficient documentation

## 2015-01-12 DIAGNOSIS — J9610 Chronic respiratory failure, unspecified whether with hypoxia or hypercapnia: Secondary | ICD-10-CM | POA: Diagnosis present

## 2015-01-12 DIAGNOSIS — R1310 Dysphagia, unspecified: Secondary | ICD-10-CM | POA: Insufficient documentation

## 2015-01-12 DIAGNOSIS — R0602 Shortness of breath: Secondary | ICD-10-CM | POA: Insufficient documentation

## 2015-01-12 DIAGNOSIS — J4489 Other specified chronic obstructive pulmonary disease: Secondary | ICD-10-CM | POA: Diagnosis present

## 2015-01-12 DIAGNOSIS — Z881 Allergy status to other antibiotic agents status: Secondary | ICD-10-CM

## 2015-01-12 DIAGNOSIS — Z87891 Personal history of nicotine dependence: Secondary | ICD-10-CM

## 2015-01-12 DIAGNOSIS — E1122 Type 2 diabetes mellitus with diabetic chronic kidney disease: Secondary | ICD-10-CM | POA: Diagnosis present

## 2015-01-12 DIAGNOSIS — I482 Chronic atrial fibrillation: Secondary | ICD-10-CM | POA: Diagnosis present

## 2015-01-12 DIAGNOSIS — E11649 Type 2 diabetes mellitus with hypoglycemia without coma: Secondary | ICD-10-CM | POA: Diagnosis present

## 2015-01-12 DIAGNOSIS — Z79899 Other long term (current) drug therapy: Secondary | ICD-10-CM

## 2015-01-12 DIAGNOSIS — R748 Abnormal levels of other serum enzymes: Secondary | ICD-10-CM | POA: Diagnosis present

## 2015-01-12 DIAGNOSIS — I50810 Right heart failure, unspecified: Secondary | ICD-10-CM | POA: Diagnosis present

## 2015-01-12 DIAGNOSIS — E860 Dehydration: Secondary | ICD-10-CM | POA: Diagnosis present

## 2015-01-12 DIAGNOSIS — Z794 Long term (current) use of insulin: Secondary | ICD-10-CM

## 2015-01-12 DIAGNOSIS — J9811 Atelectasis: Secondary | ICD-10-CM | POA: Diagnosis present

## 2015-01-12 HISTORY — DX: Chronic atrial fibrillation, unspecified: I48.20

## 2015-01-12 HISTORY — DX: Heart failure, unspecified: I50.9

## 2015-01-12 LAB — COMPREHENSIVE METABOLIC PANEL
ALT: 20 U/L (ref 17–63)
AST: 20 U/L (ref 15–41)
Albumin: 3.2 g/dL — ABNORMAL LOW (ref 3.5–5.0)
Alkaline Phosphatase: 95 U/L (ref 38–126)
Anion gap: 11 (ref 5–15)
BUN: 44 mg/dL — ABNORMAL HIGH (ref 6–20)
CHLORIDE: 94 mmol/L — AB (ref 101–111)
CO2: 34 mmol/L — AB (ref 22–32)
CREATININE: 1.79 mg/dL — AB (ref 0.61–1.24)
Calcium: 9 mg/dL (ref 8.9–10.3)
GFR calc non Af Amer: 35 mL/min — ABNORMAL LOW (ref >60)
GFR, EST AFRICAN AMERICAN: 41 mL/min — AB (ref >60)
Glucose, Bld: 325 mg/dL — ABNORMAL HIGH (ref 65–99)
POTASSIUM: 3.2 mmol/L — AB (ref 3.5–5.1)
SODIUM: 139 mmol/L (ref 135–145)
Total Bilirubin: 0.7 mg/dL (ref 0.3–1.2)
Total Protein: 7.2 g/dL (ref 6.5–8.1)

## 2015-01-12 LAB — CBC WITH DIFFERENTIAL/PLATELET
BASOS ABS: 0.1 10*3/uL (ref 0.0–0.1)
BASOS PCT: 0 %
EOS PCT: 1 %
Eosinophils Absolute: 0.2 10*3/uL (ref 0.0–0.7)
HEMATOCRIT: 49.6 % (ref 39.0–52.0)
Hemoglobin: 16.3 g/dL (ref 13.0–17.0)
Lymphocytes Relative: 17 %
Lymphs Abs: 3.1 10*3/uL (ref 0.7–4.0)
MCH: 30.9 pg (ref 26.0–34.0)
MCHC: 32.9 g/dL (ref 30.0–36.0)
MCV: 94.1 fL (ref 78.0–100.0)
MONO ABS: 0.8 10*3/uL (ref 0.1–1.0)
MONOS PCT: 5 %
NEUTROS ABS: 14.3 10*3/uL — AB (ref 1.7–7.7)
Neutrophils Relative %: 77 %
PLATELETS: 262 10*3/uL (ref 150–400)
RBC: 5.27 MIL/uL (ref 4.22–5.81)
RDW: 15.1 % (ref 11.5–15.5)
WBC: 18.4 10*3/uL — ABNORMAL HIGH (ref 4.0–10.5)

## 2015-01-12 LAB — TROPONIN I
TROPONIN I: 0.22 ng/mL — AB (ref <0.031)
Troponin I: 0.24 ng/mL — ABNORMAL HIGH (ref <0.031)

## 2015-01-12 LAB — BRAIN NATRIURETIC PEPTIDE: B NATRIURETIC PEPTIDE 5: 154 pg/mL — AB (ref 0.0–100.0)

## 2015-01-12 MED ORDER — IOHEXOL 350 MG/ML SOLN: 100.0000 mL | Freq: Once | INTRAVENOUS | Status: DC | PRN

## 2015-01-12 MED ORDER — SODIUM CHLORIDE 0.9 % IV BOLUS (SEPSIS)
1000.0000 mL | Freq: Once | INTRAVENOUS | Status: AC
  Administered 2015-01-12: 1000 mL via INTRAVENOUS

## 2015-01-12 MED ORDER — IPRATROPIUM-ALBUTEROL 0.5-2.5 (3) MG/3ML IN SOLN
3.0000 mL | RESPIRATORY_TRACT | Status: DC
  Administered 2015-01-12: 3 mL via RESPIRATORY_TRACT
  Filled 2015-01-12: qty 3

## 2015-01-12 NOTE — ED Provider Notes (Signed)
CSN: [id number]     Arrival date & time 12/25/2014  1833 History   First MD Initiated Contact with Patient 12/15/2014 1835     Chief Complaint  Patient presents with  . Shortness of Breath  . Coughing up blood      (Consider location/radiation/quality/duration/timing/severity/associated sxs/prior Treatment) Patient is a 74 y.o. male presenting with cough.  Cough Cough characteristics:  Dry Severity:  Mild Duration:  3 hours Timing:  Constant Progression:  Worsening Chronicity:  New Smoker: no   Context: not animal exposure   Associated symptoms: shortness of breath   Associated symptoms: no chest pain     Past Medical History  Diagnosis Date  . Unspecified essential hypertension   . Type II or unspecified type diabetes mellitus without mention of complication, not stated as uncontrolled   . Esophageal reflux   . Gout   . Depressive disorder, not elsewhere classified   . OSA (obstructive sleep apnea)     BiPAP  . COPD (chronic obstructive pulmonary disease) (HCC)   . Respiratory failure (HCC)   . Atrial fibrillation (HCC)   . BPH (benign prostatic hyperplasia)   . Pleural effusion, left 06/14/2007        . Pulmonary hypertension (HCC)   . Cor pulmonale (HCC)     R heart failure  . Diverticulosis   . CAD (coronary artery disease)     non-obsctructive   . Obesity   . History of nuclear stress test 06/18/2006    [facility]; normal myoview with diminished oxygen sats   . CHF (congestive heart failure) [facility](HCC)    Past Surgical History  Procedure Laterality Date  . Turp vaporization    . Cholecystectomy    . Tonsillectomy    . Inguinal hernia repair    . Transthoracic echocardiogram  03/08/2007    EF 55%; mild diastolic dysfunction; mild aortic valve stenosis with trivial AV regurg; mild MR; LA mild-mod dilated; RV mildly dilated; trivial pulm regurg; mild TR; RA mil-mod dilated  . Cardiac catheterization  08/25/2006    mild pulm htn; mild atherosclerosis but no significant  CAD   Family History  Problem Relation Age of Onset  . Prostate cancer Father    Social History  Substance Use Topics  . Smoking status: Never Smoker   . Smokeless tobacco: None     Comment: Remote history of tobacco history  . Alcohol Use: No    Review of Systems  Respiratory: Positive for cough and shortness of breath.   Cardiovascular: Positive for leg swelling. Negative for chest pain.  All other systems reviewed and are negative.     Allergies  Cefaclor; Cephalexin; and Sulfamethoxazole-trimethoprim  Home Medications   Prior to Admission medications   Medication Sig Start Date End Date Taking? Authorizing Provider  allopurinol (ZYLOPRIM) 300 MG tablet Take 300 mg by mouth daily.      Historical Provider, MD  aspirin 81 MG tablet Take 243 mg by mouth daily.    Historical Provider, MD  dextromethorphan-guaiFENesin (MUCINEX DM) 30-600 MG per 12 hr tablet Take 1 tablet by mouth every 12 (twelve) hours.      Historical Provider, MD  dutasteride (AVODART) 0.5 MG capsule Take 0.5 mg by mouth daily.      Historical Provider, MD  esomeprazole (NEXIUM) 40 MG capsule Take 40 mg by mouth daily before breakfast.      Historical Provider, MD  furosemide (LASIX) 40 MG tablet 2 tablets daily    Historical Provider, MD  insulin aspart protamine-insulin aspart (NOVOLOG 70/30) (70-30) 100 UNIT/ML injection Inject into the skin. 60 units before meals    Historical Provider, MD  insulin glargine (LANTUS) 100 UNIT/ML injection 75 in the am and 75 at bedtime    Historical Provider, MD  levalbuterol (XOPENEX HFA) 45 MCG/ACT inhaler Inhale 1-2 puffs into the lungs every 4 (four) hours as needed. 01/04/14   Coralyn Helling, MD  metolazone (ZAROXOLYN) 5 MG tablet Take 1 tablet on Tuesday and 1 on Thursday     Historical Provider, MD  OXYGEN-HELIUM IN Inhale 3-5 L into the lungs continuous.    Historical Provider, MD  potassium chloride SA (K-DUR,KLOR-CON) 20 MEQ tablet Take 10 mEq by mouth 2 (two)  times daily.     Historical Provider, MD  triamcinolone (NASACORT) 55 MCG/ACT nasal inhaler 2 sprays by Nasal route daily.      Historical Provider, MD   BP 113/71 mmHg  Pulse 126  SpO2 93% Physical Exam  Constitutional: He is oriented to person, place, and time. He appears well-developed and well-nourished.  HENT:  Head: Normocephalic and atraumatic.  Eyes: Conjunctivae are normal. Pupils are equal, round, and reactive to light.  Neck: Normal range of motion.  Cardiovascular: Tachycardia present.   Pulmonary/Chest: Effort normal. Tachypnea noted. No respiratory distress. He has rales (Left lower lobe).  Abdominal: Soft. He exhibits no distension. There is no tenderness.  Musculoskeletal: Normal range of motion. He exhibits edema.  Neurological: He is alert and oriented to person, place, and time.  Skin: Skin is warm and dry.  Nursing note and vitals reviewed.   ED Course  Procedures (including critical care time) Labs Review Labs Reviewed - No data to display  Imaging Review No results found. I have personally reviewed and evaluated these images and lab results as part of my medical decision-making.   EKG Interpretation None      MDM   Final diagnoses:  None   74 year old male with a few hours of blood-tinged sputum and one episode of what sounds like frank hemoptysis. Also with coughing service resident time. No history of the same. No recent chest pain, nausea, vomiting, fever or other cough previously. Exam as above. Initial differential was performed was for CHF versus pneumonia. Chest x-ray appeared to be consistent with pneumonia however compared to previous ones and seems to be pretty consistent. His troponin and BNP were both slightly elevated making PE a possibility but also a nSTEMI as well. Talk to the patient again and repeat EKG. Once again denies any chest pain any EKG without any evidence of elevation. Patient's BUN/creatinine is slightly elevated compared to  normal and GFR is low so we'll hold off on CT scan. I discussed case with cardiology doesn't feel he patient needs any acute intervention this time and can be admitted to medicine. I discussed case with medicine who wanted check a second troponin to ensure that there were not rising so we could decide whether the patient needed be admitted here or at [facility]. We will also pre-hydrate with a liter of fluids prior to getting the CT scan.     Marily Memos, MD 12/14/2014 [phone]

## 2015-01-12 NOTE — ED Notes (Signed)
MD at bedside. 

## 2015-01-12 NOTE — ED Provider Notes (Signed)
Report called to dr Onalee Huadavid after troponin resulted She requests place patient on stepdown unit on observation status  [name] Rhineonald Tonna Palazzi, MD 01/13/15 0000

## 2015-01-12 NOTE — ED Notes (Signed)
132/92, HR 111 per EMS

## 2015-01-12 NOTE — ED Notes (Signed)
Pt comes in with shortness of breath and states he has been coughing up blood since 1400 today. Pt was placed on face mask by EMS. Pt is at bedside with 99% oxygen on RA.  CBG 314.

## 2015-01-13 ENCOUNTER — Observation Stay (HOSPITAL_COMMUNITY): Payer: Medicare Other

## 2015-01-13 ENCOUNTER — Encounter (HOSPITAL_COMMUNITY): Payer: Self-pay | Admitting: *Deleted

## 2015-01-13 DIAGNOSIS — J449 Chronic obstructive pulmonary disease, unspecified: Secondary | ICD-10-CM | POA: Diagnosis not present

## 2015-01-13 DIAGNOSIS — I272 Other secondary pulmonary hypertension: Secondary | ICD-10-CM

## 2015-01-13 DIAGNOSIS — R042 Hemoptysis: Secondary | ICD-10-CM | POA: Diagnosis not present

## 2015-01-13 DIAGNOSIS — R7989 Other specified abnormal findings of blood chemistry: Secondary | ICD-10-CM

## 2015-01-13 DIAGNOSIS — I2699 Other pulmonary embolism without acute cor pulmonale: Secondary | ICD-10-CM

## 2015-01-13 DIAGNOSIS — N179 Acute kidney failure, unspecified: Secondary | ICD-10-CM | POA: Diagnosis present

## 2015-01-13 DIAGNOSIS — R778 Other specified abnormalities of plasma proteins: Secondary | ICD-10-CM | POA: Diagnosis present

## 2015-01-13 LAB — URINALYSIS, ROUTINE W REFLEX MICROSCOPIC
BILIRUBIN URINE: NEGATIVE
GLUCOSE, UA: NEGATIVE mg/dL
KETONES UR: NEGATIVE mg/dL
Nitrite: NEGATIVE
PH: 6 (ref 5.0–8.0)
PROTEIN: 30 mg/dL — AB
Specific Gravity, Urine: 1.02 (ref 1.005–1.030)

## 2015-01-13 LAB — GLUCOSE, CAPILLARY
GLUCOSE-CAPILLARY: 274 mg/dL — AB (ref 65–99)
GLUCOSE-CAPILLARY: 91 mg/dL (ref 65–99)
Glucose-Capillary: 179 mg/dL — ABNORMAL HIGH (ref 65–99)
Glucose-Capillary: 92 mg/dL (ref 65–99)

## 2015-01-13 LAB — TROPONIN I
TROPONIN I: 0.25 ng/mL — AB (ref <0.031)
Troponin I: 0.23 ng/mL — ABNORMAL HIGH (ref <0.031)
Troponin I: 0.22 ng/mL — ABNORMAL HIGH (ref <0.031)

## 2015-01-13 LAB — CBC
HEMATOCRIT: 45 % (ref 39.0–52.0)
HEMOGLOBIN: 14.5 g/dL (ref 13.0–17.0)
MCH: 30.1 pg (ref 26.0–34.0)
MCHC: 32.2 g/dL (ref 30.0–36.0)
MCV: 93.6 fL (ref 78.0–100.0)
Platelets: 219 10*3/uL (ref 150–400)
RBC: 4.81 MIL/uL (ref 4.22–5.81)
RDW: 15.2 % (ref 11.5–15.5)
WBC: 16 10*3/uL — ABNORMAL HIGH (ref 4.0–10.5)

## 2015-01-13 LAB — URINE MICROSCOPIC-ADD ON

## 2015-01-13 LAB — PROTIME-INR
INR: 1.11 (ref 0.00–1.49)
PROTHROMBIN TIME: 14.5 s (ref 11.6–15.2)

## 2015-01-13 LAB — BASIC METABOLIC PANEL
ANION GAP: 15 (ref 5–15)
ANION GAP: 9 (ref 5–15)
BUN: 49 mg/dL — ABNORMAL HIGH (ref 6–20)
BUN: 47 mg/dL — ABNORMAL HIGH (ref 6–20)
CALCIUM: 9 mg/dL (ref 8.9–10.3)
CALCIUM: 8.5 mg/dL — AB (ref 8.9–10.3)
CHLORIDE: 95 mmol/L — AB (ref 101–111)
CO2: 27 mmol/L (ref 22–32)
CO2: 31 mmol/L (ref 22–32)
Chloride: 95 mmol/L — ABNORMAL LOW (ref 101–111)
Creatinine, Ser: 1.85 mg/dL — ABNORMAL HIGH (ref 0.61–1.24)
Creatinine, Ser: 1.67 mg/dL — ABNORMAL HIGH (ref 0.61–1.24)
GFR calc Af Amer: 45 mL/min — ABNORMAL LOW (ref >60)
GFR calc non Af Amer: 34 mL/min — ABNORMAL LOW (ref >60)
GFR calc non Af Amer: 38 mL/min — ABNORMAL LOW (ref >60)
GFR, EST AFRICAN AMERICAN: 39 mL/min — AB (ref >60)
GLUCOSE: 332 mg/dL — AB (ref 65–99)
GLUCOSE: 314 mg/dL — AB (ref 65–99)
POTASSIUM: 3.4 mmol/L — AB (ref 3.5–5.1)
Potassium: 3.1 mmol/L — ABNORMAL LOW (ref 3.5–5.1)
Sodium: 137 mmol/L (ref 135–145)
Sodium: 135 mmol/L (ref 135–145)

## 2015-01-13 LAB — MRSA PCR SCREENING: MRSA BY PCR: NEGATIVE

## 2015-01-13 MED ORDER — INSULIN ASPART 100 UNIT/ML ~~LOC~~ SOLN: 60.0000 [IU] | Freq: Three times a day (TID) | SUBCUTANEOUS | Status: DC

## 2015-01-13 MED ORDER — INSULIN ASPART 100 UNIT/ML ~~LOC~~ SOLN
0.0000 [IU] | Freq: Three times a day (TID) | SUBCUTANEOUS | Status: DC
  Administered 2015-01-13: 5 [IU] via SUBCUTANEOUS
  Administered 2015-01-14: 1 [IU] via SUBCUTANEOUS

## 2015-01-13 MED ORDER — ONDANSETRON HCL 4 MG/2ML IJ SOLN
4.0000 mg | Freq: Four times a day (QID) | INTRAMUSCULAR | Status: DC | PRN
  Administered 2015-01-14: 4 mg via INTRAVENOUS
  Filled 2015-01-13: qty 2

## 2015-01-13 MED ORDER — DM-GUAIFENESIN ER 30-600 MG PO TB12
1.0000 | ORAL_TABLET | Freq: Two times a day (BID) | ORAL | Status: DC | PRN
  Administered 2015-01-13 – 2015-01-14 (×2): 1 via ORAL
  Filled 2015-01-13 (×2): qty 1

## 2015-01-13 MED ORDER — ACETAMINOPHEN 325 MG PO TABS
650.0000 mg | ORAL_TABLET | Freq: Four times a day (QID) | ORAL | Status: DC | PRN
  Administered 2015-01-13 – 2015-01-16 (×4): 650 mg via ORAL
  Filled 2015-01-13 (×5): qty 2

## 2015-01-13 MED ORDER — INSULIN ASPART 100 UNIT/ML ~~LOC~~ SOLN
20.0000 [IU] | Freq: Once | SUBCUTANEOUS | Status: AC
  Administered 2015-01-13: 20 [IU] via SUBCUTANEOUS

## 2015-01-13 MED ORDER — SODIUM CHLORIDE 0.9 % IV SOLN
INTRAVENOUS | Status: AC
  Administered 2015-01-13: 02:00:00 via INTRAVENOUS

## 2015-01-13 MED ORDER — FINASTERIDE 5 MG PO TABS
5.0000 mg | ORAL_TABLET | Freq: Every day | ORAL | Status: DC
  Administered 2015-01-13 – 2015-01-23 (×11): 5 mg via ORAL
  Filled 2015-01-13 (×12): qty 1

## 2015-01-13 MED ORDER — IOHEXOL 350 MG/ML SOLN
75.0000 mL | Freq: Once | INTRAVENOUS | Status: AC | PRN
  Administered 2015-01-13: 75 mL via INTRAVENOUS

## 2015-01-13 MED ORDER — ALBUTEROL SULFATE (2.5 MG/3ML) 0.083% IN NEBU
3.0000 mL | INHALATION_SOLUTION | RESPIRATORY_TRACT | Status: DC | PRN
  Administered 2015-01-15: 3 mL via RESPIRATORY_TRACT
  Filled 2015-01-13: qty 3

## 2015-01-13 MED ORDER — HEPARIN (PORCINE) IN NACL 100-0.45 UNIT/ML-% IJ SOLN
1400.0000 [IU]/h | INTRAMUSCULAR | Status: DC
  Administered 2015-01-13: 1400 [IU]/h via INTRAVENOUS
  Filled 2015-01-13: qty 250

## 2015-01-13 MED ORDER — POTASSIUM CHLORIDE CRYS ER 10 MEQ PO TBCR
20.0000 meq | EXTENDED_RELEASE_TABLET | Freq: Every day | ORAL | Status: DC
  Administered 2015-01-13 – 2015-01-24 (×12): 20 meq via ORAL
  Filled 2015-01-13 (×17): qty 2

## 2015-01-13 MED ORDER — ALLOPURINOL 300 MG PO TABS
300.0000 mg | ORAL_TABLET | Freq: Every day | ORAL | Status: DC
  Administered 2015-01-13 – 2015-01-24 (×12): 300 mg via ORAL
  Filled 2015-01-13 (×12): qty 1

## 2015-01-13 MED ORDER — DOCUSATE SODIUM 100 MG PO CAPS
100.0000 mg | ORAL_CAPSULE | Freq: Every day | ORAL | Status: DC
Start: 2015-01-13 — End: 2015-01-24
  Administered 2015-01-13 – 2015-01-23 (×11): 100 mg via ORAL
  Filled 2015-01-13 (×12): qty 1

## 2015-01-13 MED ORDER — APIXABAN 5 MG PO TABS
10.0000 mg | ORAL_TABLET | Freq: Two times a day (BID) | ORAL | Status: DC
  Administered 2015-01-13 – 2015-01-16 (×8): 10 mg via ORAL
  Filled 2015-01-13 (×9): qty 2

## 2015-01-13 MED ORDER — SODIUM CHLORIDE 0.9 % IV BOLUS (SEPSIS)
500.0000 mL | Freq: Once | INTRAVENOUS | Status: AC
  Administered 2015-01-13: 500 mL via INTRAVENOUS

## 2015-01-13 MED ORDER — INSULIN GLARGINE 100 UNIT/ML ~~LOC~~ SOLN
75.0000 [IU] | Freq: Two times a day (BID) | SUBCUTANEOUS | Status: DC
  Administered 2015-01-13 (×2): 75 [IU] via SUBCUTANEOUS
  Filled 2015-01-13 (×6): qty 0.75

## 2015-01-13 MED ORDER — INSULIN ASPART 100 UNIT/ML ~~LOC~~ SOLN
15.0000 [IU] | Freq: Once | SUBCUTANEOUS | Status: AC
Start: 2015-01-13 — End: 2015-01-13
  Administered 2015-01-13: 15 [IU] via SUBCUTANEOUS

## 2015-01-13 MED ORDER — CIPROFLOXACIN IN D5W 400 MG/200ML IV SOLN
400.0000 mg | Freq: Two times a day (BID) | INTRAVENOUS | Status: DC
  Administered 2015-01-13 – 2015-01-17 (×9): 400 mg via INTRAVENOUS
  Filled 2015-01-13 (×9): qty 200

## 2015-01-13 MED ORDER — HEPARIN BOLUS VIA INFUSION
4000.0000 [IU] | Freq: Once | INTRAVENOUS | Status: AC
  Administered 2015-01-13: 4000 [IU] via INTRAVENOUS
  Filled 2015-01-13: qty 4000

## 2015-01-13 MED ORDER — ONDANSETRON HCL 4 MG PO TABS: 4.0000 mg | ORAL_TABLET | Freq: Four times a day (QID) | ORAL | Status: DC | PRN

## 2015-01-13 NOTE — H&P (Signed)
PCP:   [name], [name], MD   Chief Complaint:  Sob, coughing up blood  HPI: 74 yo male h/o CRF bipap dep qhs, pHTN, copd , osa comes in with one day of coughing up blood lasted about 2 hours.  No chest pain.  No fevers or cough.  Some sob, no swelling in legs or pain in legs.  Pt referred for admission for mild elevation of trop, some renal injury and possible PE.  He has had no coughing of blood since arrival to the ED.    Review of Systems:  Positive and negative as per HPI otherwise all other systems are negative  Past Medical History: Past Medical History  Diagnosis Date  . Unspecified essential hypertension   . Type II or unspecified type diabetes mellitus without mention of complication, not stated as uncontrolled   . Esophageal reflux   . Gout   . Depressive disorder, not elsewhere classified   . OSA (obstructive sleep apnea)     BiPAP  . COPD (chronic obstructive pulmonary disease) (HCC)   . Respiratory failure (HCC)   . Atrial fibrillation (HCC)   . BPH (benign prostatic hyperplasia)   . Pleural effusion, left 06/14/2007        . Pulmonary hypertension (HCC)   . Cor pulmonale (HCC)     R heart failure  . Diverticulosis   . CAD (coronary artery disease)     non-obsctructive   . Obesity   . History of nuclear stress test 06/18/2006    [facility]; normal myoview with diminished oxygen sats   . CHF (congestive heart failure) [facility](HCC)    Past Surgical History  Procedure Laterality Date  . Turp vaporization    . Cholecystectomy    . Tonsillectomy    . Inguinal hernia repair    . Transthoracic echocardiogram  03/08/2007    EF 55%; mild diastolic dysfunction; mild aortic valve stenosis with trivial AV regurg; mild MR; LA mild-mod dilated; RV mildly dilated; trivial pulm regurg; mild TR; RA mil-mod dilated  . Cardiac catheterization  08/25/2006    mild pulm htn; mild atherosclerosis but no significant CAD    Medications: Prior to Admission medications   Medication  Sig Start Date End Date Taking? Authorizing Provider  allopurinol (ZYLOPRIM) 300 MG tablet Take 300 mg by mouth daily.     Yes Historical Provider, MD  aspirin 81 MG tablet Take 243 mg by mouth daily.   Yes Historical Provider, MD  dextromethorphan-guaiFENesin (MUCINEX DM) 30-600 MG per 12 hr tablet Take 1 tablet by mouth 2 (two) times daily as needed for cough.    Yes Historical Provider, MD  docusate sodium (STOOL SOFTENER) 100 MG capsule Take 100 mg by mouth daily.   Yes Historical Provider, MD  esomeprazole (NEXIUM) 40 MG capsule Take 40 mg by mouth every other day.    Yes Historical Provider, MD  finasteride (PROSCAR) 5 MG tablet Take 5 mg by mouth daily. 10/24/14  Yes Historical Provider, MD  furosemide (LASIX) 40 MG tablet Take 80 mg by mouth daily. \   Yes Historical Provider, MD  ibuprofen (ADVIL,MOTRIN) 800 MG tablet Take 800 mg by mouth 2 (two) times daily as needed for mild pain or moderate pain.  10/24/14  Yes Historical Provider, MD  insulin aspart (NOVOLOG FLEXPEN) 100 UNIT/ML FlexPen Inject 60 Units into the skin 3 (three) times daily with meals.   Yes Historical Provider, MD  insulin glargine (LANTUS) 100 UNIT/ML injection Inject 75 Units into the  skin 2 (two) times daily.    Yes Historical Provider, MD  levalbuterol [facility] HFA) 45 MCG/ACT inhaler Inhale 1-2 puffs into the lungs every 4 (four) hours as needed. 01/04/14  Yes Coralyn Helling, MD  metolazone (ZAROXOLYN) 5 MG tablet Take 5 mg by mouth once a week. Takes as needed for fluid retention   Yes Historical Provider, MD  OXYGEN Inhale into the lungs at bedtime.   Yes Historical Provider, MD  potassium chloride (K-DUR) 10 MEQ tablet Take 20 mEq by mouth daily. 01/09/15  Yes Historical Provider, MD  triamcinolone (NASACORT) 55 MCG/ACT nasal inhaler Place 2 sprays into the nose daily as needed (for congestion).    Yes Historical Provider, MD    Allergies:   Allergies  Allergen Reactions  . Cefaclor     Reactions are unknown   . Cephalexin     Reactions are unknown  . Sulfamethoxazole-Trimethoprim     Reactions are unknown    Social History:  reports that he has never smoked. He does not have any smokeless tobacco history on file. He reports that he does not drink alcohol or use illicit drugs.  Family History: Family History  Problem Relation Age of Onset  . Prostate cancer Father     Physical Exam: Filed Vitals:   01/08/2015 2313 01/11/2015 2330 12/30/2014 2346 01/13/15 0132  BP:  117/85 117/85   Pulse:  55 108   Temp:    98.1 F (36.7 C)  TempSrc:    Oral  Resp:  30 34   Height:     (1.727 m)  Weight:    109.8 kg (242 lb 1 oz)  SpO2: 97% 96% 96%    General appearance: alert, cooperative and no distress Head: Normocephalic, without obvious abnormality, atraumatic Eyes: negative Nose: Nares normal. Septum midline. Mucosa normal. No drainage or sinus tenderness. Neck: no JVD and supple, symmetrical, trachea midline Lungs: clear to auscultation bilaterally Heart: regular rate and rhythm, S1, S2 normal, no murmur, click, rub or gallop Abdomen: soft, non-tender; bowel sounds normal; no masses,  no organomegaly Extremities: extremities normal, atraumatic, no cyanosis or edema Pulses: 2+ and symmetric Skin: Skin color, texture, turgor normal. No rashes or lesions Neurologic: Grossly normal  Not oriented to place or time, but to person only   Labs on Admission:   Recent Labs  12/27/2014 1924 01/04/2015 2313  NA 139 137  K 3.2* 3.4*  CL 94* 95*  CO2 34* 27  GLUCOSE 325* 332*  BUN 44* 49*  CREATININE 1.79* 1.85*  CALCIUM 9.0 9.0    Recent Labs  12/14/2014 1924  AST 20  ALT 20  ALKPHOS 95  BILITOT 0.7  PROT 7.2  ALBUMIN 3.2*    Recent Labs  01/10/2015 1924  WBC 18.4*  NEUTROABS 14.3*  HGB 16.3  HCT 49.6  MCV 94.1  PLT 262    Recent Labs  12/23/2014 1924 12/19/2014 2313  TROPONINI 0.24* 0.22*    Radiological Exams on Admission: Dg Chest Portable 1 View  01/01/2015   CLINICAL DATA:  74 year old male with shortness of breath and cough EXAM: PORTABLE CHEST 1 VIEW COMPARISON:  Chest radiograph dated 06/01/2012 FINDINGS: No significant change in the previously seen left mid and lower lung field opacity. There is silhouetting of the left cardiac border and left hemidiaphragm. Multiple findings may be related to prominent pericardial fat and hiatal hernia, a pleural effusion or pneumonia is not excluded. Right lung base subsegmental atelectatic changes noted. There is no pneumothorax. The  cardiac borders are silhouetted. The osseous structures appear unremarkable. IMPRESSION: No significant change in left mid to lower lung field opacity. Electronically Signed   By: Elgie Collard M.D.   On: 12/21/2014 18:58   Old chart reviewed cxr reviewed no infiltrate or edema ekg reviewed afib no acute issues rapid rate  Assessment/Plan  74 yo male with multiple medical problems comes in with hemoptysis, mild elevation of troponin, AKI  Principal Problem:   Hemoptysis-  Concern for PE or could be mass.  cta ordered by ER physician after a liter ivf bolus given as cr bumped up to 1.7.  Will give another 500cc ivf bolus and place on 75cc/hour overnight, hold diuretics.  Hold anticoagulation at this time.    Active Problems:   Obesity hypoventilation syndrome (HCC)- noted   COPD with chronic bronchitis (HCC)- noted   RESPIRATORY FAILURE, CHRONIC- noted   Permanent atrial fibrillation (HCC)- noted, rate bounces up to 120 range than comes down.  seee how he responds to some rehydration.   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  Obtain more history from wife in am with whom he lives with  obs in stepdown.  Full code.  Vannak Montenegro A 01/13/2015, 1:57 AM

## 2015-01-13 NOTE — Progress Notes (Signed)
Admitted earlier today with hemoptysis. Found to have a large left lung PE. Was started on heparin due to hemoptysis. After discussion with patient and wife hemoptysis was minor and only had some blood-stained tissue. We'll elect to transition over to Eliquis today. Discussed with pharmacist will start at 10 mg twice daily for 7 days and then transition to 5 mg twice daily. We'll transfer out of the ICU. If does well today may consider discharge home in a.m.  Peggye PittEstela Hernandez, MD [facility] Pager: [phone]

## 2015-01-13 NOTE — Progress Notes (Signed)
Utilization Review Completed.[name] [name] T12/31/2016  

## 2015-01-13 NOTE — Progress Notes (Signed)
Pt transported to 300 with nursing staff. Belongings sent with patient.  Family to rendezvous with patient in rm. 306.  Vitals signs as follows:  Temp: 96.8 F (36 C) (12/31 1218) Temp Source: Axillary (12/31 1218) BP: 130/80 mmHg (12/31 1500) Pulse Rate: 95 (12/31 1500) Respirations: 20

## 2015-01-13 NOTE — Progress Notes (Signed)
ANTICOAGULATION CONSULT NOTE - Initial Consult  Pharmacy Consult for Heparin Indication: pulmonary embolus  Allergies  Allergen Reactions  . Cefaclor     Reactions are unknown  . Cephalexin     Reactions are unknown  . Sulfamethoxazole-Trimethoprim     Reactions are unknown   Patient Measurements: Height: 5\' 8"  (172.7 cm) Weight: 243 lb 9.7 oz (110.5 kg) IBW/kg (Calculated) : 68.4 Heparin Dosing Weight: 92.8kg  Vital Signs: Temp: 98.1 F (36.7 C) (12/31 0754) Temp Source: Axillary (12/31 0754) BP: 126/76 mmHg (12/31 0600) Pulse Rate: 80 (12/31 0600)  Labs:  Recent Labs  01/06/2015 1924 12/29/2014 2313 01/13/15 0446  HGB 16.3  --  14.5  HCT 49.6  --  45.0  PLT 262  --  219  CREATININE 1.79* 1.85* 1.67*  TROPONINI 0.24* 0.22* 0.23*    Estimated Creatinine Clearance: 46.1 mL/min (by C-G formula based on Cr of 1.67).   Medical History: Past Medical History  Diagnosis Date  . Unspecified essential hypertension   . Type II or unspecified type diabetes mellitus without mention of complication, not stated as uncontrolled   . Esophageal reflux   . Gout   . Depressive disorder, not elsewhere classified   . OSA (obstructive sleep apnea)     BiPAP  . COPD (chronic obstructive pulmonary disease) (HCC)   . Respiratory failure (HCC)   . Atrial fibrillation (HCC)   . BPH (benign prostatic hyperplasia)   . Pleural effusion, left 06/14/2007        . Pulmonary hypertension (HCC)   . Cor pulmonale (HCC)     R heart failure  . Diverticulosis   . CAD (coronary artery disease)     non-obsctructive   . Obesity   . History of nuclear stress test 06/18/2006    [facility]; normal myoview with diminished oxygen sats   . CHF (congestive heart failure) (HCC)     Medications:  Prescriptions prior to admission  Medication Sig Dispense Refill Last Dose  . allopurinol (ZYLOPRIM) 300 MG tablet Take 300 mg by mouth daily.     Past Week at Unknown time  . aspirin 81 MG tablet Take 243  mg by mouth daily.   Past Week at Unknown time  . dextromethorphan-guaiFENesin (MUCINEX DM) 30-600 MG per 12 hr tablet Take 1 tablet by mouth 2 (two) times daily as needed for cough.    unknown  . docusate sodium (STOOL SOFTENER) 100 MG capsule Take 100 mg by mouth daily.   Past Week at Unknown time  . esomeprazole (NEXIUM) 40 MG capsule Take 40 mg by mouth every other day.    Past Week at Unknown time  . finasteride (PROSCAR) 5 MG tablet Take 5 mg by mouth daily.   Past Week at Unknown time  . furosemide (LASIX) 40 MG tablet Take 80 mg by mouth daily. \   Past Week at Unknown time  . ibuprofen (ADVIL,MOTRIN) 800 MG tablet Take 800 mg by mouth 2 (two) times daily as needed for mild pain or moderate pain.    Past Week at Unknown time  . insulin aspart (NOVOLOG FLEXPEN) 100 UNIT/ML FlexPen Inject 60 Units into the skin 3 (three) times daily with meals.   01/11/2015 at Unknown time  . insulin glargine (LANTUS) 100 UNIT/ML injection Inject 75 Units into the skin 2 (two) times daily.    01/11/2015 at Unknown time  . levalbuterol (XOPENEX HFA) 45 MCG/ACT inhaler Inhale 1-2 puffs into the lungs every 4 (four) hours as  needed. 1 Inhaler 3 unknown  . metolazone (ZAROXOLYN) 5 MG tablet Take 5 mg by mouth once a week. Takes as needed for fluid retention   unknown  . OXYGEN Inhale into the lungs at bedtime.   01/11/2015 at Unknown time  . potassium chloride (K-DUR) 10 MEQ tablet Take 20 mEq by mouth daily.   Past Week at Unknown time  . triamcinolone (NASACORT) 55 MCG/ACT nasal inhaler Place 2 sprays into the nose daily as needed (for congestion).    unknown    Assessment: 74 yo male with CRF, OSA, Pulmonary HTN,  and COPD presents to ED with coughing up blood . No chest pain or fever. Some SOB. CT angiogram this AM shows large pulmonary embolus in the left main pulmonary artery extending into the left lower lobe. Start Heparin drip for PE  Goal of Therapy:  Heparin level 0.3-0.7 units/ml Monitor platelets  by anticoagulation protocol: Yes   Plan:  Give 4000 units bolus x 1 Start heparin infusion at 1400 units/hr Check anti-Xa level in 8 hours and daily while on heparin Continue to monitor H&H and platelets  Elder Cyphers, BS Loura Back, BCPS Clinical Pharmacist Pager [phone] 01/13/2015,9:56 AM

## 2015-01-14 DIAGNOSIS — M79604 Pain in right leg: Secondary | ICD-10-CM | POA: Diagnosis not present

## 2015-01-14 DIAGNOSIS — Z66 Do not resuscitate: Secondary | ICD-10-CM | POA: Diagnosis present

## 2015-01-14 DIAGNOSIS — K225 Diverticulum of esophagus, acquired: Secondary | ICD-10-CM | POA: Diagnosis not present

## 2015-01-14 DIAGNOSIS — R0600 Dyspnea, unspecified: Secondary | ICD-10-CM | POA: Diagnosis present

## 2015-01-14 DIAGNOSIS — I509 Heart failure, unspecified: Secondary | ICD-10-CM | POA: Diagnosis present

## 2015-01-14 DIAGNOSIS — E11649 Type 2 diabetes mellitus with hypoglycemia without coma: Secondary | ICD-10-CM | POA: Diagnosis present

## 2015-01-14 DIAGNOSIS — J948 Other specified pleural conditions: Secondary | ICD-10-CM | POA: Diagnosis not present

## 2015-01-14 DIAGNOSIS — R1319 Other dysphagia: Secondary | ICD-10-CM | POA: Diagnosis not present

## 2015-01-14 DIAGNOSIS — M79605 Pain in left leg: Secondary | ICD-10-CM | POA: Diagnosis not present

## 2015-01-14 DIAGNOSIS — Z87891 Personal history of nicotine dependence: Secondary | ICD-10-CM | POA: Diagnosis not present

## 2015-01-14 DIAGNOSIS — Z888 Allergy status to other drugs, medicaments and biological substances status: Secondary | ICD-10-CM | POA: Diagnosis not present

## 2015-01-14 DIAGNOSIS — I272 Other secondary pulmonary hypertension: Secondary | ICD-10-CM | POA: Diagnosis present

## 2015-01-14 DIAGNOSIS — N183 Chronic kidney disease, stage 3 (moderate): Secondary | ICD-10-CM | POA: Diagnosis present

## 2015-01-14 DIAGNOSIS — Z794 Long term (current) use of insulin: Secondary | ICD-10-CM | POA: Diagnosis not present

## 2015-01-14 DIAGNOSIS — E876 Hypokalemia: Secondary | ICD-10-CM | POA: Diagnosis present

## 2015-01-14 DIAGNOSIS — B370 Candidal stomatitis: Secondary | ICD-10-CM | POA: Diagnosis present

## 2015-01-14 DIAGNOSIS — J90 Pleural effusion, not elsewhere classified: Secondary | ICD-10-CM | POA: Diagnosis present

## 2015-01-14 DIAGNOSIS — J690 Pneumonitis due to inhalation of food and vomit: Secondary | ICD-10-CM | POA: Diagnosis present

## 2015-01-14 DIAGNOSIS — Z7982 Long term (current) use of aspirin: Secondary | ICD-10-CM | POA: Diagnosis not present

## 2015-01-14 DIAGNOSIS — E860 Dehydration: Secondary | ICD-10-CM | POA: Diagnosis present

## 2015-01-14 DIAGNOSIS — J9621 Acute and chronic respiratory failure with hypoxia: Secondary | ICD-10-CM | POA: Diagnosis present

## 2015-01-14 DIAGNOSIS — I4891 Unspecified atrial fibrillation: Secondary | ICD-10-CM | POA: Diagnosis not present

## 2015-01-14 DIAGNOSIS — R042 Hemoptysis: Secondary | ICD-10-CM | POA: Diagnosis not present

## 2015-01-14 DIAGNOSIS — Z7189 Other specified counseling: Secondary | ICD-10-CM | POA: Diagnosis not present

## 2015-01-14 DIAGNOSIS — Z881 Allergy status to other antibiotic agents status: Secondary | ICD-10-CM | POA: Diagnosis not present

## 2015-01-14 DIAGNOSIS — J9611 Chronic respiratory failure with hypoxia: Secondary | ICD-10-CM | POA: Diagnosis not present

## 2015-01-14 DIAGNOSIS — G4733 Obstructive sleep apnea (adult) (pediatric): Secondary | ICD-10-CM | POA: Diagnosis not present

## 2015-01-14 DIAGNOSIS — E1122 Type 2 diabetes mellitus with diabetic chronic kidney disease: Secondary | ICD-10-CM | POA: Diagnosis present

## 2015-01-14 DIAGNOSIS — Y95 Nosocomial condition: Secondary | ICD-10-CM | POA: Diagnosis present

## 2015-01-14 DIAGNOSIS — I482 Chronic atrial fibrillation: Secondary | ICD-10-CM | POA: Diagnosis not present

## 2015-01-14 DIAGNOSIS — T17990A Other foreign object in respiratory tract, part unspecified in causing asphyxiation, initial encounter: Secondary | ICD-10-CM | POA: Diagnosis present

## 2015-01-14 DIAGNOSIS — J9811 Atelectasis: Secondary | ICD-10-CM | POA: Diagnosis present

## 2015-01-14 DIAGNOSIS — N179 Acute kidney failure, unspecified: Secondary | ICD-10-CM | POA: Diagnosis present

## 2015-01-14 DIAGNOSIS — K219 Gastro-esophageal reflux disease without esophagitis: Secondary | ICD-10-CM | POA: Diagnosis present

## 2015-01-14 DIAGNOSIS — E662 Morbid (severe) obesity with alveolar hypoventilation: Secondary | ICD-10-CM | POA: Diagnosis present

## 2015-01-14 DIAGNOSIS — M109 Gout, unspecified: Secondary | ICD-10-CM | POA: Diagnosis present

## 2015-01-14 DIAGNOSIS — J9601 Acute respiratory failure with hypoxia: Secondary | ICD-10-CM | POA: Diagnosis not present

## 2015-01-14 DIAGNOSIS — R7989 Other specified abnormal findings of blood chemistry: Secondary | ICD-10-CM | POA: Diagnosis not present

## 2015-01-14 DIAGNOSIS — Z79899 Other long term (current) drug therapy: Secondary | ICD-10-CM | POA: Diagnosis not present

## 2015-01-14 DIAGNOSIS — I2510 Atherosclerotic heart disease of native coronary artery without angina pectoris: Secondary | ICD-10-CM | POA: Diagnosis present

## 2015-01-14 DIAGNOSIS — I2601 Septic pulmonary embolism with acute cor pulmonale: Secondary | ICD-10-CM | POA: Diagnosis not present

## 2015-01-14 DIAGNOSIS — F329 Major depressive disorder, single episode, unspecified: Secondary | ICD-10-CM | POA: Diagnosis present

## 2015-01-14 DIAGNOSIS — R748 Abnormal levels of other serum enzymes: Secondary | ICD-10-CM | POA: Diagnosis present

## 2015-01-14 DIAGNOSIS — I2699 Other pulmonary embolism without acute cor pulmonale: Secondary | ICD-10-CM | POA: Diagnosis present

## 2015-01-14 DIAGNOSIS — I2781 Cor pulmonale (chronic): Secondary | ICD-10-CM | POA: Diagnosis present

## 2015-01-14 DIAGNOSIS — J449 Chronic obstructive pulmonary disease, unspecified: Secondary | ICD-10-CM | POA: Diagnosis not present

## 2015-01-14 DIAGNOSIS — I130 Hypertensive heart and chronic kidney disease with heart failure and stage 1 through stage 4 chronic kidney disease, or unspecified chronic kidney disease: Secondary | ICD-10-CM | POA: Diagnosis present

## 2015-01-14 DIAGNOSIS — Z8042 Family history of malignant neoplasm of prostate: Secondary | ICD-10-CM | POA: Diagnosis not present

## 2015-01-14 DIAGNOSIS — J189 Pneumonia, unspecified organism: Secondary | ICD-10-CM | POA: Diagnosis not present

## 2015-01-14 DIAGNOSIS — K224 Dyskinesia of esophagus: Secondary | ICD-10-CM | POA: Diagnosis not present

## 2015-01-14 DIAGNOSIS — M7989 Other specified soft tissue disorders: Secondary | ICD-10-CM | POA: Diagnosis present

## 2015-01-14 DIAGNOSIS — Z6837 Body mass index (BMI) 37.0-37.9, adult: Secondary | ICD-10-CM | POA: Diagnosis not present

## 2015-01-14 DIAGNOSIS — Z515 Encounter for palliative care: Secondary | ICD-10-CM | POA: Diagnosis not present

## 2015-01-14 LAB — GLUCOSE, CAPILLARY
GLUCOSE-CAPILLARY: 128 mg/dL — AB (ref 65–99)
GLUCOSE-CAPILLARY: 177 mg/dL — AB (ref 65–99)
Glucose-Capillary: 98 mg/dL (ref 65–99)
Glucose-Capillary: 170 mg/dL — ABNORMAL HIGH (ref 65–99)

## 2015-01-14 LAB — CBC
HCT: 43.6 % (ref 39.0–52.0)
Hemoglobin: 14.6 g/dL (ref 13.0–17.0)
MCH: 31.5 pg (ref 26.0–34.0)
MCHC: 33.5 g/dL (ref 30.0–36.0)
MCV: 94.2 fL (ref 78.0–100.0)
PLATELETS: 216 10*3/uL (ref 150–400)
RBC: 4.63 MIL/uL (ref 4.22–5.81)
RDW: 15.2 % (ref 11.5–15.5)
WBC: 15.4 10*3/uL — AB (ref 4.0–10.5)

## 2015-01-14 MED ORDER — INSULIN GLARGINE 100 UNIT/ML ~~LOC~~ SOLN
45.0000 [IU] | Freq: Every day | SUBCUTANEOUS | Status: DC
  Administered 2015-01-15 – 2015-01-18 (×4): 45 [IU] via SUBCUTANEOUS
  Filled 2015-01-14 (×5): qty 0.45

## 2015-01-14 MED ORDER — INSULIN ASPART 100 UNIT/ML ~~LOC~~ SOLN
0.0000 [IU] | Freq: Every day | SUBCUTANEOUS | Status: DC
  Administered 2015-01-16 – 2015-01-18 (×3): 2 [IU] via SUBCUTANEOUS
  Administered 2015-01-23: 5 [IU] via SUBCUTANEOUS

## 2015-01-14 MED ORDER — INSULIN ASPART 100 UNIT/ML ~~LOC~~ SOLN
0.0000 [IU] | Freq: Three times a day (TID) | SUBCUTANEOUS | Status: DC
  Administered 2015-01-14: 3 [IU] via SUBCUTANEOUS
  Administered 2015-01-15: 5 [IU] via SUBCUTANEOUS
  Administered 2015-01-15: 3 [IU] via SUBCUTANEOUS
  Administered 2015-01-15 – 2015-01-16 (×2): 8 [IU] via SUBCUTANEOUS
  Administered 2015-01-16: 5 [IU] via SUBCUTANEOUS
  Administered 2015-01-16: 3 [IU] via SUBCUTANEOUS
  Administered 2015-01-17: 5 [IU] via SUBCUTANEOUS
  Administered 2015-01-17: 8 [IU] via SUBCUTANEOUS
  Administered 2015-01-17 – 2015-01-18 (×2): 3 [IU] via SUBCUTANEOUS
  Administered 2015-01-18: 5 [IU] via SUBCUTANEOUS
  Administered 2015-01-18: 3 [IU] via SUBCUTANEOUS
  Administered 2015-01-19: 11 [IU] via SUBCUTANEOUS
  Administered 2015-01-19: 3 [IU] via SUBCUTANEOUS
  Administered 2015-01-19 – 2015-01-20 (×4): 5 [IU] via SUBCUTANEOUS
  Administered 2015-01-21: 3 [IU] via SUBCUTANEOUS
  Administered 2015-01-21: 2 [IU] via SUBCUTANEOUS
  Administered 2015-01-22: 3 [IU] via SUBCUTANEOUS
  Administered 2015-01-22: 2 [IU] via SUBCUTANEOUS
  Administered 2015-01-22: 5 [IU] via SUBCUTANEOUS
  Administered 2015-01-23 (×2): 2 [IU] via SUBCUTANEOUS
  Administered 2015-01-23: 5 [IU] via SUBCUTANEOUS
  Administered 2015-01-25 (×2): 2 [IU] via SUBCUTANEOUS

## 2015-01-14 MED ORDER — INSULIN ASPART 100 UNIT/ML ~~LOC~~ SOLN
4.0000 [IU] | Freq: Three times a day (TID) | SUBCUTANEOUS | Status: DC
  Administered 2015-01-14 – 2015-01-23 (×22): 4 [IU] via SUBCUTANEOUS

## 2015-01-14 NOTE — Progress Notes (Signed)
Patient has a CBG of 91. Due to get 75 units of Lantus now. Contacted midlevel about holding this dose. Received order to only give 15 units of Lantus tonight. When this was explained to the patient and his wife at the bedside, they refused Lantus tonight.

## 2015-01-14 NOTE — Progress Notes (Signed)
Patient attempted to eat breakfast, vomited x 1 after a couple of bites.  Still feeling nauseous.  PRN zofran given

## 2015-01-14 NOTE — Progress Notes (Signed)
[facility] PROGRESS NOTE  [name] [name] ZOX:[id number] DOB: 1940/06/06 DOA: 01-19-15 PCP: [name], MD  Assessment/Plan: Hemoptysis -Resolved. -Secondary to large PE.  Large left known pulmonary embolus -Likely related to extremely sedentary state. -Has been started on Eliquis for anticoagulation.  Insulin-dependent diabetes mellitus -Has had multiple hypoglycemic episodes coupled with decreased appetite. -We'll decrease Lantus substantially from 75 units twice daily to 45 units once a day. -We'll also discontinue large amount of meal coverage insulin (60 minutes), will place on just 4 units of meal coverage for now as per sliding scale.  Elevated troponin -Likely related to demand ischemia and presence of large PE, no further cardiac workup anticipated.  Code Status: Full code Family Communication: Discussed with wife at bedside all questions answered  Disposition Plan: Likely home in 24 hours   Consultants:  None   Antibiotics:  None   Subjective: Is drowsy but arousable, no complaints  Objective: Filed Vitals:   01/13/15 2135 01/13/15 2149 01/14/15 0648 01/14/15 0809  BP: 134/64  125/61   Pulse: 81 95 74 75  Temp: 98.5 F (36.9 C)  99.2 F (37.3 C)   TempSrc: Axillary  Axillary   Resp: 20 20 20 18   Height:      Weight:   112.22 kg (247 lb 6.4 oz)   SpO2: 97% 95% 98% 98%    Intake/Output Summary (Last 24 hours) at 01/14/15 1356 Last data filed at 01/14/15 0830  Gross per 24 hour  Intake    240 ml  Output    200 ml  Net     40 ml   Filed Weights   01/13/15 0132 01/13/15 0400 01/14/15 0648  Weight: 109.8 kg (242 lb 1 oz) 110.5 kg (243 lb 9.7 oz) 112.22 kg (247 lb 6.4 oz)    Exam:   General:  Drowsy  Cardiovascular: Regular rate and rhythm  Respiratory: Clear to auscultation bilaterally  Abdomen: Soft, nontender, nondistended, positive bowel sounds  Extremities: 1+ edema  Neurologic:  Nonfocal  Data  Reviewed: Basic Metabolic Panel:  Recent Labs Lab 2015-01-19 1924 01/19/2015 2313 01/13/15 0446  NA 139 137 135  K 3.2* 3.4* 3.1*  CL 94* 95* 95*  CO2 34* 27 31  GLUCOSE 325* 332* 314*  BUN 44* 49* 47*  CREATININE 1.79* 1.85* 1.67*  CALCIUM 9.0 9.0 8.5*   Liver Function Tests:  Recent Labs Lab 01-19-2015 1924  AST 20  ALT 20  ALKPHOS 95  BILITOT 0.7  PROT 7.2  ALBUMIN 3.2*   No results for input(s): LIPASE, AMYLASE in the last 168 hours. No results for input(s): AMMONIA in the last 168 hours. CBC:  Recent Labs Lab 2015-01-19 1924 01/13/15 0446 01/14/15 0558  WBC 18.4* 16.0* 15.4*  NEUTROABS 14.3*  --   --   HGB 16.3 14.5 14.6  HCT 49.6 45.0 43.6  MCV 94.1 93.6 94.2  PLT 262 219 216   Cardiac Enzymes:  Recent Labs Lab 01-19-2015 1924 01/19/15 2313 01/13/15 0446 01/13/15 1022 01/13/15 1708  TROPONINI 0.24* 0.22* 0.23* 0.25* 0.22*   BNP (last 3 results)  Recent Labs  Jan 19, 2015 1924  BNP 154.0*    ProBNP (last 3 results) No results for input(s): PROBNP in the last 8760 hours.  CBG:  Recent Labs Lab 01/13/15 1129 01/13/15 1609 01/13/15 2135 01/14/15 0740 01/14/15 1157  GLUCAP 179* 92 91 98 128*    Recent Results (from the past 240 hour(s))  Culture, Urine     Status: None (  Preliminary result)   Collection Time: 01/31/2015 11:50 PM  Result Value Ref Range Status   Specimen Description URINE, CLEAN CATCH  Final   Special Requests NONE  Final   Culture   Final    TOO YOUNG TO READ Performed at [facility]    Report Status PENDING  Incomplete  MRSA PCR Screening     Status: None   Collection Time: 01/13/15  1:09 AM  Result Value Ref Range Status   MRSA by PCR NEGATIVE NEGATIVE Final    Comment:        The GeneXpert MRSA Assay (FDA approved for NASAL specimens only), is one component of a comprehensive MRSA colonization surveillance program. It is not intended to diagnose MRSA infection nor to guide or monitor treatment for MRSA  infections.      Studies: Ct Angio Chest Pe W/cm &/or Wo Cm  01/13/2015  CLINICAL DATA:  Hemoptysis and shortness of breath. EXAM: CT ANGIOGRAPHY CHEST WITH CONTRAST TECHNIQUE: Multidetector CT imaging of the chest was performed using the standard protocol during bolus administration of intravenous contrast. Multiplanar CT image reconstructions and MIPs were obtained to evaluate the vascular anatomy. CONTRAST:  75mL OMNIPAQUE IOHEXOL 350 MG/ML SOLN COMPARISON:  CT angiogram dated 01/27/2007 and CT scan dated 07/01/2007 FINDINGS: There is a large pulmonary embolus in the left main pulmonary artery extending into the left lower lobe. There is focal atelectasis in slight consolidation in the left lower lobe peripherally. Again noted is chronic dilatation of the distal esophagus with food material in that area. 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 Has the patient had previous esophageal surgery? This does appear unchanged since 2009. There is chronic slight cardiomegaly.  RV/ LV ratio is normal. The right lung is clear. Pulmonary vascularity is not increased. Very tiny right effusion. No osseous abnormality. No significant abnormality in the upper abdomen. Review of the MIP images confirms the above findings. IMPRESSION: 1. Large pulmonary embolism to the left lower lobe. Atelectasis in the left lower lobe. 2. Chronic dilatation of the distal esophagus with what appears to be calcification in the wall of the esophagus, not significantly changed since 2009. Critical Value/emergent results were called by telephone at the time of interpretation on 01/13/2015 at 9:45 am to Casimiro Needle, RN , who verbally acknowledged these results. Electronically Signed   By: Francene Boyers M.D.   On: 01/13/2015 09:50   Dg Chest Portable 1 View  2015-01-31  CLINICAL DATA:  75 year old male with shortness of breath and cough EXAM: PORTABLE CHEST 1 VIEW COMPARISON:  Chest radiograph dated 06/01/2012 FINDINGS: No  significant change in the previously seen left mid and lower lung field opacity. There is silhouetting of the left cardiac border and left hemidiaphragm. Multiple findings may be related to prominent pericardial fat and hiatal hernia, a pleural effusion or pneumonia is not excluded. Right lung base subsegmental atelectatic changes noted. There is no pneumothorax. The cardiac borders are silhouetted. The osseous structures appear unremarkable. IMPRESSION: No significant change in left mid to lower lung field opacity. Electronically Signed   By: Elgie Collard M.D.   On: 01/31/2015 18:58    Scheduled Meds: . allopurinol  300 mg Oral Daily  . apixaban  10 mg Oral BID  . ciprofloxacin  400 mg Intravenous Q12H  . docusate sodium  100 mg Oral Daily  . finasteride  5 mg Oral Daily  . insulin aspart  0-15 Units Subcutaneous TID WC  . insulin aspart  0-5 Units  Subcutaneous QHS  . insulin aspart  4 Units Subcutaneous TID WC  . [START ON 01/15/2015] insulin glargine  45 Units Subcutaneous QHS  . 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 Greater than 50% of this time was spent in direct contact with the patient coordinating care.    Chaya JanHERNANDEZ ACOSTA,Radames Mejorado  [facility] Pager [phone]  If 7PM-7AM, please contact night-coverage at [email or web address], password [email or web address] 01/14/2015, 1:56 PM  LOS: 1 day

## 2015-01-14 DEATH — deceased

## 2015-01-15 LAB — GLUCOSE, CAPILLARY
GLUCOSE-CAPILLARY: 257 mg/dL — AB (ref 65–99)
GLUCOSE-CAPILLARY: 216 mg/dL — AB (ref 65–99)
Glucose-Capillary: 175 mg/dL — ABNORMAL HIGH (ref 65–99)
Glucose-Capillary: 162 mg/dL — ABNORMAL HIGH (ref 65–99)

## 2015-01-15 LAB — CBC
HCT: 41.5 % (ref 39.0–52.0)
Hemoglobin: 13.2 g/dL (ref 13.0–17.0)
MCH: 30.3 pg (ref 26.0–34.0)
MCHC: 31.8 g/dL (ref 30.0–36.0)
MCV: 95.4 fL (ref 78.0–100.0)
PLATELETS: 243 10*3/uL (ref 150–400)
RBC: 4.35 MIL/uL (ref 4.22–5.81)
RDW: 15.1 % (ref 11.5–15.5)
WBC: 17.9 10*3/uL — AB (ref 4.0–10.5)

## 2015-01-15 LAB — URINE CULTURE

## 2015-01-15 MED ORDER — MAGIC MOUTHWASH
5.0000 mL | Freq: Three times a day (TID) | ORAL | Status: DC | PRN
Start: 2015-01-15 — End: 2015-01-26
  Administered 2015-01-23: 5 mL via ORAL
  Filled 2015-01-15 (×2): qty 5

## 2015-01-15 MED ORDER — FLUCONAZOLE 100 MG PO TABS
100.0000 mg | ORAL_TABLET | Freq: Every day | ORAL | Status: AC
  Administered 2015-01-15 – 2015-01-21 (×7): 100 mg via ORAL
  Filled 2015-01-15 (×7): qty 1

## 2015-01-15 NOTE — Progress Notes (Signed)
Patient coughing up a moderate amount of blood. Contacted hospitalist to notify of this. No new orders at this time. Will continue to monitor closely.

## 2015-01-15 NOTE — Care Management Note (Signed)
Case Management Note  Patient Details  Name: [name] [name] MRN: [id number] Date of Birth: 09/23/1940  Subjective/Objective:                  Admitted for PE. Pt is from home, lives with his wife and requires assistance with some ADL's. Pt uses a walker and wheelchair as needed. PT has home O2 but no neb machine. Pt has no hospital bed and is not interested in one. [facility] Pt plans to return home with resumption of HH services through [facility]. Pt has started on Eliquis, voucher already given to pt's family member.   Action/Plan: [facility] made aware of referral and will obtain pt info from chart. Will update on DC when appropriate.   Expected Discharge Date:  01/16/15               Expected Discharge Plan:  Home w Home Health Services  In-House Referral:  NA  Discharge planning Services  CM Consult  Post Acute Care Choice:  Home Health Choice offered to:  Patient  DME Arranged:    DME Agency:     HH Arranged:  RN, PT HH Agency:  [facility]  Status of Service:  In process, will continue to follow  Medicare Important Message Given:    Date Medicare IM Given:    Medicare IM give by:    Date Additional Medicare IM Given:    Additional Medicare Important Message give by:     If discussed at Long Length of Stay Meetings, dates discussed:    Additional Comments:  Malcolm MetroChildress, Juanita Streight Demske, RN 01/15/2015, 12:33 PM

## 2015-01-15 NOTE — Progress Notes (Addendum)
[facility] PROGRESS NOTE  [name] [name] ZOX:[id number] DOB: [birth year] DOA: 12/22/2014 PCP: [name], MD  Assessment/Plan: Hemoptysis -Scant hemoptysis overnight. -Secondary to large PE. -Hb stable.  Large left known pulmonary embolus -Likely related to extremely sedentary state. -Has been started on Eliquis for anticoagulation.  Insulin-dependent diabetes mellitus -Has had multiple hypoglycemic episodes coupled with decreased appetite. -Lantus has been decreased substantially from 75 units twice daily to 45 units once a day. -CBGs are now elevated, prefer this to hypoglycemia, will follow and adjust insulin as required.  Elevated troponin -Likely related to demand ischemia and presence of large PE, no further cardiac workup anticipated.  Thrush -Started on diflucan. -Suspect this is causing his difficulty swallowing.  Code Status: Full code Family Communication: Discussed with wife at bedside all questions answered  Disposition Plan: Likely home in 24 hours. Will request PT consultation   Consultants:  None   Antibiotics:  None   Subjective: Is drowsy but arousable, no complaints  Objective: Filed Vitals:   01/14/15 2104 01/15/15 0246 01/15/15 0630 01/15/15 1425  BP: 132/66  127/62 140/61  Pulse: 86  108 113  Temp: 98.5 F (36.9 C)  98.9 F (37.2 C) 98.7 F (37.1 C)  TempSrc: Oral  Oral   Resp: 20  20 18   Height:      Weight:      SpO2: 99% 90% 94% 97%    Intake/Output Summary (Last 24 hours) at 01/15/15 1452 Last data filed at 01/15/15 0930  Gross per 24 hour  Intake    480 ml  Output      0 ml  Net    480 ml   Filed Weights   01/13/15 0132 01/13/15 0400 01/14/15 0648  Weight: 109.8 kg (242 lb 1 oz) 110.5 kg (243 lb 9.7 oz) 112.22 kg (247 lb 6.4 oz)    Exam:   General:  Drowsy  Cardiovascular: Regular rate and rhythm  Respiratory: Clear to auscultation bilaterally  Abdomen: Soft, nontender, nondistended,  positive bowel sounds  Extremities: 1+ edema  Neurologic:  Nonfocal  Data Reviewed: Basic Metabolic Panel:  Recent Labs Lab 12/19/2014 1924 12/31/2014 2313 01/13/15 0446  NA 139 137 135  K 3.2* 3.4* 3.1*  CL 94* 95* 95*  CO2 34* 27 31  GLUCOSE 325* 332* 314*  BUN 44* 49* 47*  CREATININE 1.79* 1.85* 1.67*  CALCIUM 9.0 9.0 8.5*   Liver Function Tests:  Recent Labs Lab 01/11/2015 1924  AST 20  ALT 20  ALKPHOS 95  BILITOT 0.7  PROT 7.2  ALBUMIN 3.2*   No results for input(s): LIPASE, AMYLASE in the last 168 hours. No results for input(s): AMMONIA in the last 168 hours. CBC:  Recent Labs Lab 01/08/2015 1924 01/13/15 0446 01/14/15 0558 01/15/15 0540  WBC 18.4* 16.0* 15.4* 17.9*  NEUTROABS 14.3*  --   --   --   HGB 16.3 14.5 14.6 13.2  HCT 49.6 45.0 43.6 41.5  MCV 94.1 93.6 94.2 95.4  PLT 262 219 216 243   Cardiac Enzymes:  Recent Labs Lab 12/14/2014 1924 01/08/2015 2313 01/13/15 0446 01/13/15 1022 01/13/15 1708  TROPONINI 0.24* 0.22* 0.23* 0.25* 0.22*   BNP (last 3 results)  Recent Labs  12/28/2014 1924  BNP 154.0*    ProBNP (last 3 results) No results for input(s): PROBNP in the last 8760 hours.  CBG:  Recent Labs Lab 01/14/15 1157 01/14/15 1648 01/14/15 2103 01/15/15 0731 01/15/15 1143  GLUCAP 128* 170* 177* 257* 216*  Recent Results (from the past 240 hour(s))  Culture, Urine     Status: None   Collection Time: 02/24/2014 11:50 PM  Result Value Ref Range Status   Specimen Description URINE, CLEAN CATCH  Final   Special Requests NONE  Final   Culture   Final    MULTIPLE SPECIES PRESENT, SUGGEST RECOLLECTION [facility]    Report Status 01/15/2015 FINAL  Final  MRSA PCR Screening     Status: None   Collection Time: 01/13/15  1:09 AM  Result Value Ref Range Status   MRSA by PCR NEGATIVE NEGATIVE Final    Comment:        The GeneXpert MRSA Assay (FDA approved for NASAL specimens only), is one component of  a comprehensive MRSA colonization surveillance program. It is not intended to diagnose MRSA infection nor to guide or monitor treatment for MRSA infections.      Studies: No results found.  Scheduled Meds: . allopurinol  300 mg Oral Daily  . apixaban  10 mg Oral BID  . ciprofloxacin  400 mg Intravenous Q12H  . docusate sodium  100 mg Oral Daily  . finasteride  5 mg Oral Daily  . fluconazole  100 mg Oral Daily  . insulin aspart  0-15 Units Subcutaneous TID WC  . insulin aspart  0-5 Units Subcutaneous QHS  . insulin aspart  4 Units Subcutaneous TID WC  . insulin glargine  45 Units Subcutaneous QHS  . 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 Greater than 50% of this time was spent in direct contact with the patient coordinating care.    Chaya JanHERNANDEZ ACOSTA,ESTELA  [facility] Pager [phone]  If 7PM-7AM, please contact night-coverage at [email or web address], password [email or web address] 01/15/2015, 2:52 PM  LOS: 2 days

## 2015-01-15 NOTE — Progress Notes (Signed)
Patient coughing up a moderate amount of blood. Notified Dr Ardyth HarpsHernandez via page.

## 2015-01-16 LAB — GLUCOSE, CAPILLARY
GLUCOSE-CAPILLARY: 259 mg/dL — AB (ref 65–99)
GLUCOSE-CAPILLARY: 229 mg/dL — AB (ref 65–99)
Glucose-Capillary: 161 mg/dL — ABNORMAL HIGH (ref 65–99)
Glucose-Capillary: 85 mg/dL (ref 65–99)
Glucose-Capillary: 248 mg/dL — ABNORMAL HIGH (ref 65–99)

## 2015-01-16 LAB — CBC
HEMATOCRIT: 41.1 % (ref 39.0–52.0)
Hemoglobin: 13.2 g/dL (ref 13.0–17.0)
MCH: 30.8 pg (ref 26.0–34.0)
MCHC: 32.1 g/dL (ref 30.0–36.0)
MCV: 96 fL (ref 78.0–100.0)
Platelets: 232 10*3/uL (ref 150–400)
RBC: 4.28 MIL/uL (ref 4.22–5.81)
RDW: 15.2 % (ref 11.5–15.5)
WBC: 16.2 10*3/uL — AB (ref 4.0–10.5)

## 2015-01-16 MED ORDER — LIDOCAINE VISCOUS 2 % MT SOLN
5.0000 mL | Freq: Three times a day (TID) | OROMUCOSAL | Status: DC
  Administered 2015-01-16 – 2015-01-25 (×26): 5 mL via OROMUCOSAL
  Filled 2015-01-16: qty 5
  Filled 2015-01-16: qty 15
  Filled 2015-01-16: qty 5
  Filled 2015-01-16: qty 15
  Filled 2015-01-16: qty 5
  Filled 2015-01-16: qty 15
  Filled 2015-01-16 (×3): qty 5
  Filled 2015-01-16 (×3): qty 15
  Filled 2015-01-16 (×7): qty 5
  Filled 2015-01-16: qty 15
  Filled 2015-01-16 (×2): qty 5
  Filled 2015-01-16: qty 15
  Filled 2015-01-16 (×12): qty 5

## 2015-01-16 MED ORDER — MAGIC MOUTHWASH W/LIDOCAINE: 5.0000 mL | Freq: Three times a day (TID) | ORAL | Status: DC

## 2015-01-16 MED ORDER — MAGIC MOUTHWASH
5.0000 mL | Freq: Three times a day (TID) | ORAL | Status: DC
Start: 2015-01-16 — End: 2015-01-26
  Administered 2015-01-16 – 2015-01-25 (×26): 5 mL via ORAL
  Filled 2015-01-16 (×31): qty 5

## 2015-01-16 NOTE — Consult Note (Signed)
   [facility] Inpatient Consult   01/16/2015  [name] [name] October 14, 1940 [id number]   Met with the patient and wife at bedside to offer [facility] services as benefit of Medicare. Patient wife requests RNCM to speak with her daughter, [name] [name], at [phone] to discuss [facility] services.   Call to patient daughter at above listed number, unable to reach, left message requesting call.  RNCM left [facility] brochure with RNCM card for information and follow up.  Of note, [facility] Management services does not replace or interfere with any services that are arranged by inpatient case management or social work.  For additional questions or referrals please contact: Royetta Crochet. Laymond Purser, RN, BSN, [facility] Liaison [phone]

## 2015-01-16 NOTE — Progress Notes (Signed)
[facility] PROGRESS NOTE  [name] ArtisWilliam H [name] JYN:[id number] DOB: [birth year] DOA: 2015-01-07 PCP: [name], [name], MD  Assessment/Plan: Hemoptysis -Continues to have hemoptysis. -Secondary to large PE. -Hb stable. -We'll request pulmonary evaluation. See no need to discontinue Eliquis at this point.  Large left known pulmonary embolus -Likely related to extremely sedentary state. -Has been started on Eliquis for anticoagulation.  Insulin-dependent diabetes mellitus -Has had multiple hypoglycemic episodes coupled with decreased appetite. Suspect due to inability to swallow due to thrush. -Lantus has been decreased substantially from 75 units twice daily to 45 units once a day. -CBGs are now elevated, prefer this to hypoglycemia, will follow and adjust insulin as required.  Elevated troponin -Likely related to demand ischemia and presence of large PE, no further cardiac workup anticipated.  Thrush -Started on diflucan and Magic mouthwash -Suspect this is causing his difficulty swallowing. -Improving.  Code Status: Full code Family Communication: Discussed with wife at bedside all questions answered  Disposition Plan: Likely home in 24 hours.    Consultants:  None   Antibiotics:  None   Subjective: Emesis basin with some blood-tinged sputum. No chest pain or shortness of breath.  Objective: Filed Vitals:   01/15/15 1622 01/15/15 2032 01/16/15 0539 01/16/15 1100  BP:  117/91 139/77   Pulse:  108 84   Temp:  98.6 F (37 C) 97.9 F (36.6 C)   TempSrc:  Oral Oral   Resp:  20 20   Height:      Weight:   112.038 kg (247 lb)   SpO2: 98% 99% 98% 95%    Intake/Output Summary (Last 24 hours) at 01/16/15 1141 Last data filed at 01/15/15 1900  Gross per 24 hour  Intake    200 ml  Output      0 ml  Net    200 ml   Filed Weights   01/13/15 0400 01/14/15 0648 01/16/15 0539  Weight: 110.5 kg (243 lb 9.7 oz) 112.22 kg (247 lb 6.4 oz) 112.038 kg (247 lb)     Exam:   General:  Alert, awake, oriented 3  Cardiovascular: Regular rate and rhythm  Respiratory: Clear to auscultation bilaterally  Abdomen: Soft, nontender, nondistended, positive bowel sounds  Extremities: 1+ edema  Neurologic:  Nonfocal  Data Reviewed: Basic Metabolic Panel:  Recent Labs Lab April 19, 2014 1924 April 19, 2014 2313 01/13/15 0446  NA 139 137 135  K 3.2* 3.4* 3.1*  CL 94* 95* 95*  CO2 34* 27 31  GLUCOSE 325* 332* 314*  BUN 44* 49* 47*  CREATININE 1.79* 1.85* 1.67*  CALCIUM 9.0 9.0 8.5*   Liver Function Tests:  Recent Labs Lab April 19, 2014 1924  AST 20  ALT 20  ALKPHOS 95  BILITOT 0.7  PROT 7.2  ALBUMIN 3.2*   No results for input(s): LIPASE, AMYLASE in the last 168 hours. No results for input(s): AMMONIA in the last 168 hours. CBC:  Recent Labs Lab April 19, 2014 1924 01/13/15 0446 01/14/15 0558 01/15/15 0540 01/16/15 0650  WBC 18.4* 16.0* 15.4* 17.9* 16.2*  NEUTROABS 14.3*  --   --   --   --   HGB 16.3 14.5 14.6 13.2 13.2  HCT 49.6 45.0 43.6 41.5 41.1  MCV 94.1 93.6 94.2 95.4 96.0  PLT 262 219 216 243 232   Cardiac Enzymes:  Recent Labs Lab April 19, 2014 1924 April 19, 2014 2313 01/13/15 0446 01/13/15 1022 01/13/15 1708  TROPONINI 0.24* 0.22* 0.23* 0.25* 0.22*   BNP (last 3 results)  Recent Labs  April 19, 2014 1924  BNP 154.0*    ProBNP (last 3 results) No results for input(s): PROBNP in the last 8760 hours.  CBG:  Recent Labs Lab 01/15/15 0731 01/15/15 1143 01/15/15 1620 01/15/15 2035 01/16/15 0722  GLUCAP 257* 216* 175* 162* 161*    Recent Results (from the past 240 hour(s))  Culture, Urine     Status: None   Collection Time: 12/30/2014 11:50 PM  Result Value Ref Range Status   Specimen Description URINE, CLEAN CATCH  Final   Special Requests NONE  Final   Culture   Final    MULTIPLE SPECIES PRESENT, SUGGEST RECOLLECTION Performed at [facility]    Report Status 01/15/2015 FINAL  Final  MRSA PCR Screening      Status: None   Collection Time: 01/13/15  1:09 AM  Result Value Ref Range Status   MRSA by PCR NEGATIVE NEGATIVE Final    Comment:        The GeneXpert MRSA Assay (FDA approved for NASAL specimens only), is one component of a comprehensive MRSA colonization surveillance program. It is not intended to diagnose MRSA infection nor to guide or monitor treatment for MRSA infections.      Studies: No results found.  Scheduled Meds: . allopurinol  300 mg Oral Daily  . apixaban  10 mg Oral BID  . ciprofloxacin  400 mg Intravenous Q12H  . docusate sodium  100 mg Oral Daily  . finasteride  5 mg Oral Daily  . fluconazole  100 mg Oral Daily  . insulin aspart  0-15 Units Subcutaneous TID WC  . insulin aspart  0-5 Units Subcutaneous QHS  . insulin aspart  4 Units Subcutaneous TID WC  . insulin glargine  45 Units Subcutaneous QHS  . magic mouthwash w/lidocaine  5 mL Oral TID  . 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 Greater than 50% of this time was spent in direct contact with the patient coordinating care.    [name]  [facility] Pager [phone]  If 7PM-7AM, please contact night-coverage at [email or web address], password [facility] 01/16/2015, 11:41 AM  LOS: 3 days

## 2015-01-16 NOTE — Progress Notes (Signed)
PT Cancellation Note  Patient Details Name: [name] GrateWilliam H Lukes MRN: [id number] DOB: 09/11/1940   Cancelled Treatment:    Reason Eval/Treat Not Completed: Medical issues which prohibited therapy.  Pt reports feeling "terrible".  His resting HR is fluctuating from 110-136 and he has been coughing up blood.  I have discussed my concerns with RN and decided to hold PT until I could discuss with MD.   Myrlene BrokerBrown, Mart Colpitts L  PT 01/16/2015, 11:00 AM [phone]

## 2015-01-16 NOTE — Progress Notes (Signed)
PT Cancellation Note  Patient Details Name: [name] GrateWilliam H [name] MRN: [id number] DOB: 04-28-1940   Cancelled Treatment:    Reason Eval/Treat Not Completed: Fatigue/lethargy limiting ability to participate.  MDs note seen and appreciated.  Pt's HR now no higher than 120 so I made another attempt to see pt.  He was sleeping soundly.  Wife advised that he does not like to be bothered when sleeping and given that he has been reporting general malaise, I opted to defer PT until tomorrow.  I discussed the pt status at length with wife.  He apparently only walks a few feet to the bathroom-otherwise is usually independent in transferring bed to chair.  He is in bed a great deal of the time and she serves his meals in bed.  I am concerned that he will have lost transfer ability since he has not been out of bed since admission (12/21/2014).  It is possible that they will need a Hoyer lift in the home and even at that I am not sure that wife will be able to manage that if it is needed.  Fortunately, granddaughter lives with them and will be able to assist.  He definitely needs a PT eval prior to discharge.   Myrlene BrokerBrown, Particia Strahm L  PT 01/16/2015, 3:08 PM [phone]

## 2015-01-17 ENCOUNTER — Inpatient Hospital Stay (HOSPITAL_COMMUNITY): Payer: Medicare Other

## 2015-01-17 DIAGNOSIS — K224 Dyskinesia of esophagus: Secondary | ICD-10-CM

## 2015-01-17 DIAGNOSIS — I2699 Other pulmonary embolism without acute cor pulmonale: Secondary | ICD-10-CM

## 2015-01-17 DIAGNOSIS — R1319 Other dysphagia: Secondary | ICD-10-CM

## 2015-01-17 DIAGNOSIS — K225 Diverticulum of esophagus, acquired: Secondary | ICD-10-CM

## 2015-01-17 DIAGNOSIS — I4891 Unspecified atrial fibrillation: Secondary | ICD-10-CM

## 2015-01-17 LAB — BASIC METABOLIC PANEL
Anion gap: 6 (ref 5–15)
BUN: 34 mg/dL — AB (ref 6–20)
CO2: 35 mmol/L — ABNORMAL HIGH (ref 22–32)
CREATININE: 1.47 mg/dL — AB (ref 0.61–1.24)
Calcium: 8.6 mg/dL — ABNORMAL LOW (ref 8.9–10.3)
Chloride: 98 mmol/L — ABNORMAL LOW (ref 101–111)
GFR calc Af Amer: 52 mL/min — ABNORMAL LOW (ref >60)
GFR, EST NON AFRICAN AMERICAN: 45 mL/min — AB (ref >60)
GLUCOSE: 188 mg/dL — AB (ref 65–99)
POTASSIUM: 4.1 mmol/L (ref 3.5–5.1)
SODIUM: 139 mmol/L (ref 135–145)

## 2015-01-17 LAB — TROPONIN I
TROPONIN I: 0.1 ng/mL — AB (ref <0.031)
Troponin I: 0.09 ng/mL — ABNORMAL HIGH (ref <0.031)

## 2015-01-17 LAB — GLUCOSE, CAPILLARY
Glucose-Capillary: 168 mg/dL — ABNORMAL HIGH (ref 65–99)
Glucose-Capillary: 256 mg/dL — ABNORMAL HIGH (ref 65–99)
Glucose-Capillary: 277 mg/dL — ABNORMAL HIGH (ref 65–99)
Glucose-Capillary: 243 mg/dL — ABNORMAL HIGH (ref 65–99)
Glucose-Capillary: 206 mg/dL — ABNORMAL HIGH (ref 65–99)

## 2015-01-17 LAB — HEPARIN LEVEL (UNFRACTIONATED)
HEPARIN UNFRACTIONATED: 2.01 [IU]/mL — AB (ref 0.30–0.70)
Heparin Unfractionated: 2.01 IU/mL — ABNORMAL HIGH (ref 0.30–0.70)

## 2015-01-17 LAB — APTT
APTT: 81 s — AB (ref 24–37)
aPTT: 39 seconds — ABNORMAL HIGH (ref 24–37)

## 2015-01-17 LAB — CBC
HCT: 39.2 % (ref 39.0–52.0)
Hemoglobin: 12.2 g/dL — ABNORMAL LOW (ref 13.0–17.0)
MCH: 29.9 pg (ref 26.0–34.0)
MCHC: 31.1 g/dL (ref 30.0–36.0)
MCV: 96.1 fL (ref 78.0–100.0)
PLATELETS: 248 10*3/uL (ref 150–400)
RBC: 4.08 MIL/uL — AB (ref 4.22–5.81)
RDW: 15.2 % (ref 11.5–15.5)
WBC: 18.2 10*3/uL — ABNORMAL HIGH (ref 4.0–10.5)

## 2015-01-17 MED ORDER — DILTIAZEM HCL 30 MG PO TABS
30.0000 mg | ORAL_TABLET | Freq: Once | ORAL | Status: AC
Start: 2015-01-17 — End: 2015-01-17
  Administered 2015-01-17: 30 mg via ORAL
  Filled 2015-01-17: qty 1

## 2015-01-17 MED ORDER — DILTIAZEM HCL 30 MG PO TABS
30.0000 mg | ORAL_TABLET | Freq: Four times a day (QID) | ORAL | Status: DC
  Administered 2015-01-17: 30 mg via ORAL
  Filled 2015-01-17: qty 1

## 2015-01-17 MED ORDER — HEPARIN (PORCINE) IN NACL 100-0.45 UNIT/ML-% IJ SOLN
1350.0000 [IU]/h | INTRAMUSCULAR | Status: DC
  Administered 2015-01-17 – 2015-01-19 (×3): 1350 [IU]/h via INTRAVENOUS
  Filled 2015-01-17 (×3): qty 250

## 2015-01-17 MED ORDER — LEVOFLOXACIN IN D5W 500 MG/100ML IV SOLN
500.0000 mg | INTRAVENOUS | Status: DC
  Administered 2015-01-17 – 2015-01-21 (×5): 500 mg via INTRAVENOUS
  Filled 2015-01-17 (×5): qty 100

## 2015-01-17 MED ORDER — PANTOPRAZOLE SODIUM 40 MG IV SOLR
40.0000 mg | INTRAVENOUS | Status: DC
  Administered 2015-01-17 – 2015-01-24 (×8): 40 mg via INTRAVENOUS
  Filled 2015-01-17 (×8): qty 40

## 2015-01-17 MED ORDER — DILTIAZEM HCL 60 MG PO TABS
60.0000 mg | ORAL_TABLET | Freq: Four times a day (QID) | ORAL | Status: DC
  Administered 2015-01-17 – 2015-01-18 (×3): 60 mg via ORAL
  Filled 2015-01-17 (×3): qty 1

## 2015-01-17 NOTE — Clinical Documentation Improvement (Signed)
Internal Medicine  Can the diagnosis of "Right heart failure due to pulmonary hypertension - stable" be further specified? This condition is noted in H&P. Please document findings in next progress note; NOT IN BPA DROP DOWN BOX. Thank you!    Acuity - Acute, Chronic, Acute on Chronic   Type - Systolic, Diastolic, Systolic and Diastolic   Other  Clinically Undetermined  Document any associated diagnoses/conditions  Supporting Information:  Only cardiac medication patient is on is: Cardizem 30mg  q6h  RV/ LV ratio is normal (CT angio of Chest)  Please exercise your independent, professional judgment when responding. A specific answer is not anticipated or expected.  Thank You,  Shellee MiloEileen T Zachari Alberta RN, BSN, CCDS Health Information Management [place] [phone]; Cell: [phone]

## 2015-01-17 NOTE — Progress Notes (Signed)
PT Cancellation Note  Patient Details Name: Kathlynn GrateWilliam H Kirchner MRN: [id number] DOB: [birth year]   Cancelled Treatment:    Reason Eval/Treat Not Completed: Patient not medically ready. Chart reviewed, RN consulted. Pt HR remains in afib between 108-135bpm while observing for 30 seconds on tele. HR is not stable enough for PT evaluation. I have paged attending physician and will evaluate at later date/time, once pt is more medically stable.     Buccola,Allan C 01/17/2015, 10:29 AM 10:31 AM  Rosamaria LintsAllan C Buccola, PT, DPT [place] License # [id number]

## 2015-01-17 NOTE — Progress Notes (Signed)
[facility] PROGRESS NOTE  Deanna ArtisWilliam H [name] ZOX:[id number] DOB: 16-Feb-1940 DOA: 12/26/2014 PCP: [name], [name], MD   74 ? chronic kidney disease stage III Pulmonary hypertension + OHS S/OSA known chronic pleural effusion COPD,followed by Dr. Craige CottaSood pulmonology-mixed obstructive/restrictive defect 05/2012 spirometry mild pulmonary hypertension by cath 2008 normal coronary arteries IDDM Body mass index is 37.56 kg/(m^2). presented to emergency room 12/31/16with hemoptysis CT chest =? PE? Mass felt to have large left pulmonary embolism  Also found on CT was questionable diverticulum   On admission  Found to have elevated troponin, Submassive PE  Converted to atrial fibrillation 01/17/15 a.m.  SUBJ Coughing up blood.  Just started c/o swallowing about 1 week Meat is hard to swallow-anythign hard Dr Karilyn Cotaehman used to be his GI  [facility] can sit up on the side of the bed and does need assistance at home Can get to wheelchair by himself Gets therapy at home-discharged him recently  Assessment/Plan: coughing up blood -not completely convinced that this is pulmonary ashas been coughing and this has been associated also with dysphagia over the past one week -I have asked gastroenterology to see the patient in terms of scope to determine if this is a diverticular bleed from the esophagus as he has had this in the past.  he was first diagnosed with this in the 6470s and was referred to [facility] by Dr. Victorino Sparrowehmanand had further workup of this -Hb stable in the range of 12-13 -I will get a chest x-ray to rule out any acute perforation of the esophagus or further infiltrates in the lung however he seems relatively stable at present -can continue swish and swallow Magic mouthwash -transition to clear liquid diet for right now  new-onset atrial fibrillation, [place] score greater than 3 Transition off of Elliquis given above discussion Echocardiogram shows EF65-70%,severe septal  hypertrophy? pericardial effusion continue Cardizem 60 every 8 Will consider cardiology input  Large left known pulmonary embolus -Likely related to extremely sedentary state. -we'll hold Elliquis for now given undeterminedsource of bleeding -Start heparin per pharmacy which can be turned off easily  Insulin-dependent diabetes mellitus -Has had multiple hypoglycemic episodes coupled with decreased appetite. -Lantus has been decreased substantially from 75 units twice daily to 45 units once a day. -CBGs are now 168-188  Elevated troponin -Likely related to demand ischemia and presence of large PE, no further cardiac workup anticipated.  acute kidney injury Secondary to poor perfusion, PE improvement from admission 44/1.7-->30/1.4  leukocytosis -? Stress de margination vs PNA-could also have possible lung infarct: Monitor trends No current fever App Pulmonary input Rpt CXR today Cipro 12/31-->1/4 Levaquin 1/4-- Also has thrush.    Hypokalemia Improved  Thrush -Started on diflucan. -Suspect this is causing his difficulty swallowing.  pulmonary disease-mixed obstructive, restrictive defect -might benefit from PFTs as an outpatient  pulmonary hypertension -Probable substrate for A. Fib -Monitor on oxygen desats Green as an outpatient  Chronic pleural effusion -Currently stable at this time   Code Status: Full code Family Communication: Discussed with wife at bedside all questions answered  Disposition Plan: Likely home in 24 hours. Will request PT consultation   Consultants:  None   Antibiotics:  None   Subjective:  fair Flipped into atrial fibrillation overnight no chest pain no nausea no vomiting still however coughing up what looks like sticky blood   Objective: Filed Vitals:   01/16/15 1512 01/16/15 2123 01/17/15 0646 01/17/15 0730  BP: 139/69 132/74 132/73 135/96  Pulse: 72  Temp: 98.3 F (36.8 C) 98.4 F (36.9 C) 98.4 F (36.9 C)     TempSrc:  Oral Oral   Resp: 20     Height:      Weight:      SpO2: 97%  98% 96%    Intake/Output Summary (Last 24 hours) at 01/17/15 0744 Last data filed at 01/16/15 1200  Gross per 24 hour  Intake    220 ml  Output      0 ml  Net    220 ml   Filed Weights   01/13/15 0400 01/14/15 0648 01/16/15 0539  Weight: 110.5 kg (243 lb 9.7 oz) 112.22 kg (247 lb 6.4 oz) 112.038 kg (247 lb)    Exam:   General:  awake alert  Cardiovascular: irregularly irregular  Respiratory: Clear to auscultation bilaterally  Abdomen: Soft, nontender, nondistended, positive bowel sounds  Extremities: trace lower extremity edema  Neurologic:  Nonfocal  Data Reviewed: Basic Metabolic Panel:  Recent Labs Lab 01-19-15 1924 01/19/2015 2313 01/13/15 0446 01/17/15 0536  NA 139 137 135 139  K 3.2* 3.4* 3.1* 4.1  CL 94* 95* 95* 98*  CO2 34* 27 31 35*  GLUCOSE 325* 332* 314* 188*  BUN 44* 49* 47* 34*  CREATININE 1.79* 1.85* 1.67* 1.47*  CALCIUM 9.0 9.0 8.5* 8.6*   Liver Function Tests:  Recent Labs Lab Jan 19, 2015 1924  AST 20  ALT 20  ALKPHOS 95  BILITOT 0.7  PROT 7.2  ALBUMIN 3.2*   No results for input(s): LIPASE, AMYLASE in the last 168 hours. No results for input(s): AMMONIA in the last 168 hours. CBC:  Recent Labs Lab 2015/01/19 1924 01/13/15 0446 01/14/15 0558 01/15/15 0540 01/16/15 0650 01/17/15 0536  WBC 18.4* 16.0* 15.4* 17.9* 16.2* 18.2*  NEUTROABS 14.3*  --   --   --   --   --   HGB 16.3 14.5 14.6 13.2 13.2 12.2*  HCT 49.6 45.0 43.6 41.5 41.1 39.2  MCV 94.1 93.6 94.2 95.4 96.0 96.1  PLT 262 219 216 243 232 248   Cardiac Enzymes:  Recent Labs Lab 2015-01-19 1924 01-19-15 2313 01/13/15 0446 01/13/15 1022 01/13/15 1708  TROPONINI 0.24* 0.22* 0.23* 0.25* 0.22*   BNP (last 3 results)  Recent Labs  2015/01/19 1924  BNP 154.0*    ProBNP (last 3 results) No results for input(s): PROBNP in the last 8760 hours.  CBG:  Recent Labs Lab 01/16/15 0722  01/16/15 1144 01/16/15 1223 01/16/15 1621 01/16/15 2156  GLUCAP 161* 259* 85 248* 229*    Recent Results (from the past 240 hour(s))  Culture, Urine     Status: None   Collection Time: 01/19/2015 11:50 PM  Result Value Ref Range Status   Specimen Description URINE, CLEAN CATCH  Final   Special Requests NONE  Final   Culture   Final    MULTIPLE SPECIES PRESENT, SUGGEST RECOLLECTION Performed at [facility]    Report Status 01/15/2015 FINAL  Final  MRSA PCR Screening     Status: None   Collection Time: 01/13/15  1:09 AM  Result Value Ref Range Status   MRSA by PCR NEGATIVE NEGATIVE Final    Comment:        The GeneXpert MRSA Assay (FDA approved for NASAL specimens only), is one component of a comprehensive MRSA colonization surveillance program. It is not intended to diagnose MRSA infection nor to guide or monitor treatment for MRSA infections.      Studies: No results found.  Scheduled  Meds: . allopurinol  300 mg Oral Daily  . apixaban  10 mg Oral BID  . ciprofloxacin  400 mg Intravenous Q12H  . docusate sodium  100 mg Oral Daily  . finasteride  5 mg Oral Daily  . fluconazole  100 mg Oral Daily  . insulin aspart  0-15 Units Subcutaneous TID WC  . insulin aspart  0-5 Units Subcutaneous QHS  . insulin aspart  4 Units Subcutaneous TID WC  . insulin glargine  45 Units Subcutaneous QHS  . lidocaine  5 mL Mouth/Throat TID   And  . magic mouthwash  5 mL Oral TID  . 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 Greater than 50% of this time was spent  in direct contact with the patient coordinating care.   Pleas Koch, MD [facility] [phone]

## 2015-01-17 NOTE — Progress Notes (Signed)
PM PTT within goal range.  Plan: Continue heparin at same rate, f/u AM labs.  Mady GemmaHayes, Nilo Fallin R, [facility] HospitalRPH 01/17/2015 8:56 PM

## 2015-01-17 NOTE — Consult Note (Signed)
Referring Provider: Rhetta Mura, MD Primary Care Physician:  [name], MD Primary Gastroenterologist:  Dr. Karilyn Cota  Reason for Consultation:    Dysphagia in patient with known esophageal motility disorder and esophageal diverticulum.  HPI:   History is obtained from patient's wife who is at bedside.  Patient is 75 year old Caucasian male with multiple medical problems who presented to emergency room on 01/02/2015 with three-day of hemoptysis. On the day of admission he had more hemoptysis than on prior days. He did not experience chest pain shortness of breath fever chills or diaphoresis. He did experience right shoulder and left on pain for which he took ibuprofen. Evaluation in emergency room revealed elevated troponin levels and CT angios chest revealed pulmonary embolism and this study also revealed large esophageal diverticulum. Patient was begun Eliquis on 01/13/2015. Since patient has been complaining of dysphagia he was begun on heparin infusion in anticipation of esophagogastroduodenoscopy. Patient has had dysphagia for several years but it has gotten worse over the last few weeks. He has no difficulty with liquids. He denies heartburn. He also denies nausea vomiting or hematemesis. Patient does not have a good appetite. He has polyarthralgia and takes Advil 800 mg every day. There is no history of melena or rectal bleeding. According to his wife he has lost more than 30 pounds last year. He has been doing very poorly for at least one year. He stays in bed most of the time. Only time he gets up is to go to the bathroom. He is using diapers since he is unable to make it to the bathroom quickly.  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 Initial surgery was in late 70s and second surgery was in 1991 or 92 when he had resection of large esophageal diverticulum. The surgeries were performed at [facility]. Esophageal diverticulum recurred and he was reevaluated over  10 years ago and felt to be high risk for surgery. Last EGD was well over 10 years ago. These records are on microfilm and have been requested.      Past Medical History  Diagnosis Date  . Unspecified essential hypertension   . Type II or unspecified type diabetes mellitus without mention of complication, not stated as uncontrolled   . Esophageal reflux   . Gout   . Depressive disorder, not elsewhere classified   . OSA (obstructive sleep apnea)     BiPAP  . COPD (chronic obstructive pulmonary disease) (HCC)   . Respiratory failure (HCC)   . Atrial fibrillation (HCC)   . BPH (benign prostatic hyperplasia)   . Pleural effusion, left 06/14/2007        . Pulmonary hypertension (HCC)   . Cor pulmonale (HCC)     R heart failure  . Diverticulosis   . CAD (coronary artery disease)     non-obsctructive   . Obesity   . History of nuclear stress test 06/18/2006    [facility]; normal myoview with diminished oxygen sats   . CHF (congestive heart failure) [facility])     Past Surgical History  Procedure Laterality Date  . Turp vaporization    . Cholecystectomy    . Tonsillectomy    . Inguinal hernia repair    . Transthoracic echocardiogram  03/08/2007    EF 55%; mild diastolic dysfunction; mild aortic valve stenosis with trivial AV regurg; mild MR; LA mild-mod dilated; RV mildly dilated; trivial pulm regurg; mild TR; RA mil-mod dilated  . Cardiac catheterization  08/25/2006    mild pulm htn;  mild atherosclerosis but no significant CAD    Prior to Admission medications   Medication Sig Start Date End Date Taking? Authorizing Provider  allopurinol (ZYLOPRIM) 300 MG tablet Take 300 mg by mouth daily.     Yes Historical Provider, MD  aspirin 81 MG tablet Take 243 mg by mouth daily.   Yes Historical Provider, MD  dextromethorphan-guaiFENesin (MUCINEX DM) 30-600 MG per 12 hr tablet Take 1 tablet by mouth 2 (two) times daily as needed for cough.    Yes Historical Provider, MD  docusate sodium (STOOL  SOFTENER) 100 MG capsule Take 100 mg by mouth daily.   Yes Historical Provider, MD  esomeprazole (NEXIUM) 40 MG capsule Take 40 mg by mouth every other day.    Yes Historical Provider, MD  finasteride (PROSCAR) 5 MG tablet Take 5 mg by mouth daily. 10/24/14  Yes Historical Provider, MD  furosemide (LASIX) 40 MG tablet Take 80 mg by mouth daily. \   Yes Historical Provider, MD  ibuprofen (ADVIL,MOTRIN) 800 MG tablet Take 800 mg by mouth 2 (two) times daily as needed for mild pain or moderate pain.  10/24/14  Yes Historical Provider, MD  insulin aspart (NOVOLOG FLEXPEN) 100 UNIT/ML FlexPen Inject 60 Units into the skin 3 (three) times daily with meals.   Yes Historical Provider, MD  insulin glargine (LANTUS) 100 UNIT/ML injection Inject 75 Units into the skin 2 (two) times daily.    Yes Historical Provider, MD  levalbuterol [facility] HFA) 45 MCG/ACT inhaler Inhale 1-2 puffs into the lungs every 4 (four) hours as needed. 01/04/14  Yes Coralyn Helling, MD  metolazone (ZAROXOLYN) 5 MG tablet Take 5 mg by mouth once a week. Takes as needed for fluid retention   Yes Historical Provider, MD  OXYGEN Inhale into the lungs at bedtime.   Yes Historical Provider, MD  potassium chloride (K-DUR) 10 MEQ tablet Take 20 mEq by mouth daily. 01/09/15  Yes Historical Provider, MD  triamcinolone (NASACORT) 55 MCG/ACT nasal inhaler Place 2 sprays into the nose daily as needed (for congestion).    Yes Historical Provider, MD    Current Facility-Administered Medications  Medication Dose Route Frequency Provider Last Rate Last Dose  . acetaminophen (TYLENOL) tablet 650 mg  650 mg Oral Q6H PRN Gerome Apley Harduk, PA-C   650 mg at 01/16/15 0845  . albuterol (PROVENTIL) (2.5 MG/3ML) 0.083% nebulizer solution 3 mL  3 mL Inhalation Q4H PRN Haydee Monica, MD   3 mL at 01/15/15 0245  . allopurinol (ZYLOPRIM) tablet 300 mg  300 mg Oral Daily Haydee Monica, MD   300 mg at 01/17/15 1117  . dextromethorphan-guaiFENesin (MUCINEX DM) 30-600  MG per 12 hr tablet 1 tablet  1 tablet Oral BID PRN Haydee Monica, MD   1 tablet at 01/14/15 1837  . diltiazem (CARDIZEM) tablet 60 mg  60 mg Oral 4 times per day Rhetta Mura, MD   Stopped at 01/17/15 1200  . docusate sodium (COLACE) capsule 100 mg  100 mg Oral Daily Haydee Monica, MD   100 mg at 01/17/15 1117  . finasteride (PROSCAR) tablet 5 mg  5 mg Oral Daily Haydee Monica, MD   5 mg at 01/17/15 1116  . fluconazole (DIFLUCAN) tablet 100 mg  100 mg Oral Daily Henderson Cloud, MD   100 mg at 01/17/15 1117  . heparin ADULT infusion 100 units/mL (25000 units/250 mL)  1,350 Units/hr Intravenous Continuous Rhetta Mura, MD 13.5 mL/hr at 01/17/15 1404 1,350  Units/hr at 01/17/15 1404  . insulin aspart (novoLOG) injection 0-15 Units  0-15 Units Subcutaneous TID WC Estela Isaiah Blakes, MD   8 Units at 01/17/15 1416  . insulin aspart (novoLOG) injection 0-5 Units  0-5 Units Subcutaneous QHS Henderson Cloud, MD   2 Units at 01/16/15 2203  . insulin aspart (novoLOG) injection 4 Units  4 Units Subcutaneous TID WC Estela Isaiah Blakes, MD   4 Units at 01/16/15 1739  . insulin glargine (LANTUS) injection 45 Units  45 Units Subcutaneous QHS Henderson Cloud, MD   45 Units at 01/16/15 2203  . levofloxacin (LEVAQUIN) IVPB 500 mg  500 mg Intravenous Q24H Kari Baars, MD   500 mg at 01/17/15 1123  . lidocaine (XYLOCAINE) 2 % viscous mouth solution 5 mL  5 mL Mouth/Throat TID Henderson Cloud, MD   5 mL at 01/17/15 1118   And  . magic mouthwash  5 mL Oral TID Henderson Cloud, MD   5 mL at 01/17/15 1118  . magic mouthwash  5 mL Oral TID PRN Henderson Cloud, MD      . ondansetron [facility]) tablet 4 mg  4 mg Oral Q6H PRN Haydee Monica, MD       Or  . ondansetron [facility]) injection 4 mg  4 mg Intravenous Q6H PRN Haydee Monica, MD   4 mg at 01/14/15 0856  . potassium chloride (K-DUR,KLOR-CON) CR tablet 20 mEq  20 mEq Oral Daily  Haydee Monica, MD   20 mEq at 01/17/15 1118    Allergies as of 12/18/2014 - Review Complete 12/21/2014  Allergen Reaction Noted  . Cefaclor    . Cephalexin    . Sulfamethoxazole-trimethoprim      Family History        father was treated for prostate carcinoma and died of pneumonia at age 94.  Mother lived to be 36 and died 2 years ago.  Patient does not have any siblings.       Social History   Social History  .  he has been married twice.  He is retired from [employer] where he worked for 27 years.  He smoked cigarettes sporadically but quit over 20 years ago.  He used to drink alcohol socially but hasn't had any drink in over 20 years.         .    .       Review of Systems: See HPI, otherwise normal ROS  Physical Exam: Temp:  [98 F (36.7 C)-98.4 F (36.9 C)] 98 F (36.7 C) (01/04 1457) Pulse Rate:  [100-120] 100 (01/04 1457) Resp:  [20] 20 (01/04 1457) BP: (132-153)/(73-96) 153/81 mmHg (01/04 1457) SpO2:  [94 %-99 %] 99 % (01/04 1457) Last BM Date: 12/15/2014 Well-developed obese Caucasian male who is lying listlessly in bed. He does not appear to be in any distress. He does respond to questions appropriately. Conjunctiva was pink. Sclerae nonicteric. Oropharyngeal mucosa is dry. Dentition in fair condition. Several teeth are missing. No neck masses or thyromegaly noted. Cardiac exam with regular rhythm normal S1 and S2. No murmur or gallop noted. Auscultation of lungs revealed diminished intensity of breath sounds bilaterally. Abdomen is full with plical hernia by the size of small tangerine. Skin is thin. This hernia is partially reducible. Abdomen is soft and nontender. No organomegaly or masses. He does not have referred the mouth or clubbing. He is incontinent of urine.  Lab Results:  Recent Labs  01/15/15 0540 01/16/15 0650 01/17/15 0536  WBC 17.9* 16.2* 18.2*  HGB 13.2 13.2 12.2*  HCT 41.5 41.1 39.2  PLT 243 232 248    BMET  Recent Labs  01/17/15 0536  NA 139  K 4.1  CL 98*  CO2 35*  GLUCOSE 188*  BUN 34*  CREATININE 1.47*  CALCIUM 8.6*    Studies/Results: Dg Chest Port 1 View  01/17/2015  CLINICAL DATA:  Pneumonia. EXAM: PORTABLE CHEST 1 VIEW COMPARISON:  January 12, 2015. FINDINGS: Stable cardiomegaly is noted. No pneumothorax is noted. Central pulmonary vascular congestion is again noted. Mild right basilar opacity is noted concerning for edema or subsegmental atelectasis. Stable moderate left pleural effusion is noted with underlying atelectasis, edema or infiltrate. Bony thorax is unremarkable. IMPRESSION: Stable cardiomegaly and central pulmonary vascular congestion. Stable moderate left pleural effusion with underlying atelectasis, infiltrate or edema. Electronically Signed   By: Lupita RaiderJames  Green Jr, M.D.   On: 01/17/2015 12:55   current chest CT was reviewed with Dr. Tyron RussellBoles along with CT from 06/22/2006 which also revealed epiphrenic diverticulum. Barium study from 03/12/2007 also reviewed revealing large distal esophageal diverticulum originating from left left or wall just proximal to GE junction.  Assessment;  Patient is 75 year old Caucasian male with multiple medical problems who is on heparin infusion for pulmonary embolism and he is also being treated with broad-spectrum antibiotic as he is deemed to be at risk for infection and infarcted segment. Patient has chronic dysphagia which has gotten worse lately. He also has lost over 30 pounds in one year. He has history of esophageal motility disorder complicated by recurrent diverticular disease. He had diverticular resection on 2 occasions but diverticulum is recurred. Recurrence first documented over 10 years ago. Worsening dysphagia could be secondary to reflux esophagitis since he is essentially bedbound and he also takes ibuprofen daily. He is also at risk for peptic ulcer disease and therefore needs to be on PPI for prophylaxis. Patient  will benefit from diagnostic and possibly therapeutic EGD when deemed to be stable from medical standpoint. Therefore we should leave him on heparin infusion until the time of EGD at which time he can be transitioned to Eliquis or other agents as appropriate. Mildly elevated troponin levels presumed to be due to noncardiac sources.   Recommendations;  Pantoprazole 40 mg IV every 24 hours. Esophagogastroduodenoscopy during this hospitalization when patient deemed to be stable from pulmonary and cardiac standpoint.      LOS: 4 days   REHMAN,NAJEEB U  01/17/2015, 4:12 PM

## 2015-01-17 NOTE — Progress Notes (Signed)
Dr. Mahala MenghiniSamtani notified of EKG results via text page.

## 2015-01-17 NOTE — Clinical Documentation Improvement (Signed)
Internal Medicine  Can the diagnosis of CKD/CRF be further specified? CRF noted in H&P. Please document findings in next progress note; NOT IN BPA DROP DOWN BOX. Thanks!   CKD Stage I - GFR greater than or equal to 90  CKD Stage II - GFR 60-89  CKD Stage III - GFR 30-59  CKD Stage IV - GFR 15-29  CKD Stage V - GFR < 15  ESRD (End Stage Renal Disease)  Other condition  Unable to clinically determine  Supporting Information: : (risk factors, signs and symptoms, diagnostics, treatment)  White male  GFR's running from 734 to 445 for current admission  Please exercise your independent, professional judgment when responding. A specific answer is not anticipated or expected.  Thank You, Shellee MiloEileen T Mayre Bury RN, BSN, CCDS Health Information Management [place] [phone]; Cell: [phone]

## 2015-01-17 NOTE — Progress Notes (Signed)
ANTICOAGULATION CONSULT NOTE - Initial Consult  Pharmacy Consult for heparin Indication: atrial fibrillation, PE  Allergies  Allergen Reactions  . Cefaclor     Reactions are unknown  . Cephalexin     Reactions are unknown  . Sulfamethoxazole-Trimethoprim     Reactions are unknown    Patient Measurements: Height: 5\' 8"  (172.7 cm) Weight: 247 lb (112.038 kg) IBW/kg (Calculated) : 68.4 Heparin Dosing Weight: 92.8 kg  Vital Signs: Temp: 98.3 F (36.8 C) (01/04 1006) Temp Source: Oral (01/04 1006) BP: 143/73 mmHg (01/04 1006) Pulse Rate: 120 (01/04 1006)  Labs:  Recent Labs  01/15/15 0540 01/16/15 0650 01/17/15 0536  HGB 13.2 13.2 12.2*  HCT 41.5 41.1 39.2  PLT 243 232 248  CREATININE  --   --  1.47*    Estimated Creatinine Clearance: 52.7 mL/min (by C-G formula based on Cr of 1.47).   Medical History: Past Medical History  Diagnosis Date  . Unspecified essential hypertension   . Type II or unspecified type diabetes mellitus without mention of complication, not stated as uncontrolled   . Esophageal reflux   . Gout   . Depressive disorder, not elsewhere classified   . OSA (obstructive sleep apnea)     BiPAP  . COPD (chronic obstructive pulmonary disease) (HCC)   . Respiratory failure (HCC)   . Atrial fibrillation (HCC)   . BPH (benign prostatic hyperplasia)   . Pleural effusion, left 06/14/2007        . Pulmonary hypertension (HCC)   . Cor pulmonale (HCC)     R heart failure  . Diverticulosis   . CAD (coronary artery disease)     non-obsctructive   . Obesity   . History of nuclear stress test 06/18/2006    [facility]; normal myoview with diminished oxygen sats   . CHF (congestive heart failure) (HCC)     Medications:  Prescriptions prior to admission  Medication Sig Dispense Refill Last Dose  . allopurinol (ZYLOPRIM) 300 MG tablet Take 300 mg by mouth daily.     Past Week at Unknown time  . aspirin 81 MG tablet Take 243 mg by mouth daily.   Past Week  at Unknown time  . dextromethorphan-guaiFENesin (MUCINEX DM) 30-600 MG per 12 hr tablet Take 1 tablet by mouth 2 (two) times daily as needed for cough.    unknown  . docusate sodium (STOOL SOFTENER) 100 MG capsule Take 100 mg by mouth daily.   Past Week at Unknown time  . esomeprazole (NEXIUM) 40 MG capsule Take 40 mg by mouth every other day.    Past Week at Unknown time  . finasteride (PROSCAR) 5 MG tablet Take 5 mg by mouth daily.   Past Week at Unknown time  . furosemide (LASIX) 40 MG tablet Take 80 mg by mouth daily. \   Past Week at Unknown time  . ibuprofen (ADVIL,MOTRIN) 800 MG tablet Take 800 mg by mouth 2 (two) times daily as needed for mild pain or moderate pain.    Past Week at Unknown time  . insulin aspart (NOVOLOG FLEXPEN) 100 UNIT/ML FlexPen Inject 60 Units into the skin 3 (three) times daily with meals.   01/11/2015 at Unknown time  . insulin glargine (LANTUS) 100 UNIT/ML injection Inject 75 Units into the skin 2 (two) times daily.    01/11/2015 at Unknown time  . levalbuterol (XOPENEX HFA) 45 MCG/ACT inhaler Inhale 1-2 puffs into the lungs every 4 (four) hours as needed. 1 Inhaler 3 unknown  .  metolazone (ZAROXOLYN) 5 MG tablet Take 5 mg by mouth once a week. Takes as needed for fluid retention   unknown  . OXYGEN Inhale into the lungs at bedtime.   01/11/2015 at Unknown time  . potassium chloride (K-DUR) 10 MEQ tablet Take 20 mEq by mouth daily.   Past Week at Unknown time  . triamcinolone (NASACORT) 55 MCG/ACT nasal inhaler Place 2 sprays into the nose daily as needed (for congestion).    unknown    Assessment: 75 yo man started on eliquis to change to heparin.  He received his last dose of eliquis last night.  We will likely need to use aPTT to guide therapy initially.  He is coughing up some blood. Goal of Therapy:  aPTT 66-102 seconds Monitor platelets by anticoagulation protocol: Yes  Heparin level 0.3-0.7 units/ml   Plan:  Start heparin drip at 1350 units/hr with no  bolus. Check baseline aPTT and heparin level. Check aPTT and heparin level ~6-8 hours after start of drip.  Monitor for bleeding complications. Thanks for allowing pharmacy to be a part of this patient's care.  Talbert Cage, PharmD Clinical Pharmacist  01/17/2015,11:39 AM

## 2015-01-17 NOTE — Progress Notes (Signed)
Inpatient Diabetes Program Recommendations  AACE/ADA: New Consensus Statement on Inpatient Glycemic Control (2015)  Target Ranges:  Prepandial:   less than 140 mg/dL      Peak postprandial:   less than 180 mg/dL (1-2 hours)      Critically ill patients:  140 - 180 mg/dL   Review of Glycemic Control:  Results for [name] [name], Keneth H (MRN [id number]) as of 01/17/2015 14:00  Ref. Range 01/16/2015 12:23 01/16/2015 16:21 01/16/2015 21:56 01/17/2015 07:56 01/17/2015 12:23  Glucose-Capillary Latest Ref Range: 65-99 mg/dL 85 409248 (H) 811229 (H) 914168 (H) 256 (H)   Diabetes history: Type 2 diabetes Outpatient Diabetes medications: Lantus 75 units bid, Novolog 60 units tid with meals Current orders for Inpatient glycemic control:  Lantus 45 units q HS, Novolog moderate tid with meals and HS, Novolog 4 units tid with meals  Inpatient Diabetes Program Recommendations:    Note CBG's continue to be elevated.  Current meal intake according to documentation is less than 50%.    Please consider increasing Lantus to 30 units bid.    Thanks, Beryl MeagerJenny Anahla Bevis, RN, BC-ADM Inpatient Diabetes Coordinator Pager [phone] (8a-5p)

## 2015-01-17 NOTE — Consult Note (Signed)
Consult requested by: [facility] hospitalist Consult requested for hemoptysis with pulmonary embolus:  HPI: This is a 75 year old who came to the emergency department with increasing shortness of breath. CT scan shows a large left ovarian embolism. At baseline he has COPD, obstructive sleep apnea, chronic atrial fibrillation, cor pulmonale, and pulmonary hypertension. He has been coughing up blood. He is fully anticoagulated. His left ventricular to right ventricular ratio was normal on CT but with his history of cor pulmonale I think he needs an echocardiogram which is apparently artery been ordered. He's not having much chest pain.  Past Medical History  Diagnosis Date  . Unspecified essential hypertension   . Type II or unspecified type diabetes mellitus without mention of complication, not stated as uncontrolled   . Esophageal reflux   . Gout   . Depressive disorder, not elsewhere classified   . OSA (obstructive sleep apnea)     BiPAP  . COPD (chronic obstructive pulmonary disease) (HCC)   . Respiratory failure (HCC)   . Atrial fibrillation (HCC)   . BPH (benign prostatic hyperplasia)   . Pleural effusion, left 06/14/2007        . Pulmonary hypertension (HCC)   . Cor pulmonale (HCC)     R heart failure  . Diverticulosis   . CAD (coronary artery disease)     non-obsctructive   . Obesity   . History of nuclear stress test 06/18/2006    [facility] Hawking; normal myoview with diminished oxygen sats   . CHF (congestive heart failure) (HCC)      Family History  Problem Relation Age of Onset  . Prostate cancer Father      Social History   Social History  . Marital Status: Married    Spouse Name: N/A  . Number of Children: N/A  . Years of Education: N/A   Occupational History  . Retired    Social History Main Topics  . Smoking status: Never Smoker   . Smokeless tobacco: None     Comment: Remote history of tobacco history  . Alcohol Use: No  . Drug Use: No  . Sexual Activity: Not  Asked   Other Topics Concern  . None   Social History Narrative     ROS: He's not having much chest pain. He is short of breath. He doesn't wheeze much at home. He sleeps well on BiPAP. Otherwise per the history and physical    Objective: Vital signs in last 24 hours: Temp:  [98.3 F (36.8 C)-98.4 F (36.9 C)] 98.4 F (36.9 C) (01/04 0646) Pulse Rate:  [72] 72 (01/03 1512) Resp:  [20] 20 (01/03 1512) BP: (132-139)/(69-96) 135/96 mmHg (01/04 0730) SpO2:  [95 %-98 %] 96 % (01/04 0730) Weight change:  Last BM Date: Jan 23, 2015  Intake/Output from previous day: 01/03 0701 - 01/04 0700 In: 220 [P.O.:220] Out: -   PHYSICAL EXAM He is awake and alert. He is obese. He is coughing during the examination. His HEENT examination is generally unremarkable. His neck is supple. His chest shows some rhonchi bilaterally. He's coughing up blood periodically. His heart is irregular but I don't hear a gallop. His abdomen is soft without masses. Extremities showed no edema. Central nervous system examination is grossly intact  Lab Results: Basic Metabolic Panel:  Recent Labs  96/04/54 0536  NA 139  K 4.1  CL 98*  CO2 35*  GLUCOSE 188*  BUN 34*  CREATININE 1.47*  CALCIUM 8.6*   Liver Function Tests: No results for input(s):  AST, ALT, ALKPHOS, BILITOT, PROT, ALBUMIN in the last 72 hours. No results for input(s): LIPASE, AMYLASE in the last 72 hours. No results for input(s): AMMONIA in the last 72 hours. CBC:  Recent Labs  01/16/15 0650 01/17/15 0536  WBC 16.2* 18.2*  HGB 13.2 12.2*  HCT 41.1 39.2  MCV 96.0 96.1  PLT 232 248   Cardiac Enzymes: No results for input(s): CKTOTAL, CKMB, CKMBINDEX, TROPONINI in the last 72 hours. BNP: No results for input(s): PROBNP in the last 72 hours. D-Dimer: No results for input(s): DDIMER in the last 72 hours. CBG:  Recent Labs  01/16/15 0722 01/16/15 1144 01/16/15 1223 01/16/15 1621 01/16/15 2156 01/17/15 0756  GLUCAP 161* 259*  85 248* 229* 168*   Hemoglobin A1C: No results for input(s): HGBA1C in the last 72 hours. Fasting Lipid Panel: No results for input(s): CHOL, HDL, LDLCALC, TRIG, CHOLHDL, LDLDIRECT in the last 72 hours. Thyroid Function Tests: No results for input(s): TSH, T4TOTAL, FREET4, T3FREE, THYROIDAB in the last 72 hours. Anemia Panel: No results for input(s): VITAMINB12, FOLATE, FERRITIN, TIBC, IRON, RETICCTPCT in the last 72 hours. Coagulation: No results for input(s): LABPROT, INR in the last 72 hours. Urine Drug Screen: Drugs of Abuse  No results found for: LABOPIA, COCAINSCRNUR, LABBENZ, AMPHETMU, THCU, LABBARB  Alcohol Level: No results for input(s): ETH in the last 72 hours. Urinalysis: No results for input(s): COLORURINE, LABSPEC, PHURINE, GLUCOSEU, HGBUR, BILIRUBINUR, KETONESUR, PROTEINUR, UROBILINOGEN, NITRITE, LEUKOCYTESUR in the last 72 hours.  Invalid input(s): APPERANCEUR Misc. Labs:   ABGS: No results for input(s): PHART, PO2ART, TCO2, HCO3 in the last 72 hours.  Invalid input(s): PCO2   MICROBIOLOGY: Recent Results (from the past 240 hour(s))  Culture, Urine     Status: None   Collection Time: 2015/02/05 11:50 PM  Result Value Ref Range Status   Specimen Description URINE, CLEAN CATCH  Final   Special Requests NONE  Final   Culture   Final    MULTIPLE SPECIES PRESENT, SUGGEST RECOLLECTION Performed at [facility]    Report Status 01/15/2015 FINAL  Final  MRSA PCR Screening     Status: None   Collection Time: 01/13/15  1:09 AM  Result Value Ref Range Status   MRSA by PCR NEGATIVE NEGATIVE Final    Comment:        The GeneXpert MRSA Assay (FDA approved for NASAL specimens only), is one component of a comprehensive MRSA colonization surveillance program. It is not intended to diagnose MRSA infection nor to guide or monitor treatment for MRSA infections.     Studies/Results: No results found.  Medications:  Prior to Admission:  Prescriptions  prior to admission  Medication Sig Dispense Refill Last Dose  . allopurinol (ZYLOPRIM) 300 MG tablet Take 300 mg by mouth daily.     Past Week at Unknown time  . aspirin 81 MG tablet Take 243 mg by mouth daily.   Past Week at Unknown time  . dextromethorphan-guaiFENesin (MUCINEX DM) 30-600 MG per 12 hr tablet Take 1 tablet by mouth 2 (two) times daily as needed for cough.    unknown  . docusate sodium (STOOL SOFTENER) 100 MG capsule Take 100 mg by mouth daily.   Past Week at Unknown time  . esomeprazole (NEXIUM) 40 MG capsule Take 40 mg by mouth every other day.    Past Week at Unknown time  . finasteride (PROSCAR) 5 MG tablet Take 5 mg by mouth daily.   Past Week at Unknown time  .  furosemide (LASIX) 40 MG tablet Take 80 mg by mouth daily. \   Past Week at Unknown time  . ibuprofen (ADVIL,MOTRIN) 800 MG tablet Take 800 mg by mouth 2 (two) times daily as needed for mild pain or moderate pain.    Past Week at Unknown time  . insulin aspart (NOVOLOG FLEXPEN) 100 UNIT/ML FlexPen Inject 60 Units into the skin 3 (three) times daily with meals.   01/11/2015 at Unknown time  . insulin glargine (LANTUS) 100 UNIT/ML injection Inject 75 Units into the skin 2 (two) times daily.    01/11/2015 at Unknown time  . levalbuterol (XOPENEX HFA) 45 MCG/ACT inhaler Inhale 1-2 puffs into the lungs every 4 (four) hours as needed. 1 Inhaler 3 unknown  . metolazone (ZAROXOLYN) 5 MG tablet Take 5 mg by mouth once a week. Takes as needed for fluid retention   unknown  . OXYGEN Inhale into the lungs at bedtime.   01/11/2015 at Unknown time  . potassium chloride (K-DUR) 10 MEQ tablet Take 20 mEq by mouth daily.   Past Week at Unknown time  . triamcinolone (NASACORT) 55 MCG/ACT nasal inhaler Place 2 sprays into the nose daily as needed (for congestion).    unknown   Scheduled: . allopurinol  300 mg Oral Daily  . apixaban  10 mg Oral BID  . ciprofloxacin  400 mg Intravenous Q12H  . diltiazem  30 mg Oral 4 times per day  .  docusate sodium  100 mg Oral Daily  . finasteride  5 mg Oral Daily  . fluconazole  100 mg Oral Daily  . insulin aspart  0-15 Units Subcutaneous TID WC  . insulin aspart  0-5 Units Subcutaneous QHS  . insulin aspart  4 Units Subcutaneous TID WC  . insulin glargine  45 Units Subcutaneous QHS  . lidocaine  5 mL Mouth/Throat TID   And  . magic mouthwash  5 mL Oral TID  . potassium chloride  20 mEq Oral Daily   Continuous:  BJY:[id number]:acetaminophen, albuterol, dextromethorphan-guaiFENesin, magic mouthwash, ondansetron **OR** ondansetron (ZOFRAN) IV  Assesment: He has pulmonary embolus and has hemoptysis from that. Since he has atrial fibrillation and a pulmonary embolus. We have any opportunity to discontinue his anticoagulation. His hemoptysis is not massive. He has multiple other medical problems including COPD and cor pulmonale. I think he probably has pulmonary infarction.  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 This would not be unusual in the setting of a large pulmonary embolus and preexisting lung disease. I would discontinue Cipro and switched to Levaquin which will have better coverage for his lungs    LOS: 4 days   Rigdon Macomber L 01/17/2015, 9:00 AM

## 2015-01-18 ENCOUNTER — Encounter (INDEPENDENT_AMBULATORY_CARE_PROVIDER_SITE_OTHER): Payer: Self-pay

## 2015-01-18 ENCOUNTER — Inpatient Hospital Stay (HOSPITAL_COMMUNITY): Payer: Medicare Other

## 2015-01-18 DIAGNOSIS — J948 Other specified pleural conditions: Secondary | ICD-10-CM

## 2015-01-18 DIAGNOSIS — R7989 Other specified abnormal findings of blood chemistry: Secondary | ICD-10-CM

## 2015-01-18 DIAGNOSIS — R042 Hemoptysis: Secondary | ICD-10-CM

## 2015-01-18 DIAGNOSIS — I482 Chronic atrial fibrillation: Secondary | ICD-10-CM

## 2015-01-18 DIAGNOSIS — I2601 Septic pulmonary embolism with acute cor pulmonale: Secondary | ICD-10-CM

## 2015-01-18 DIAGNOSIS — J9611 Chronic respiratory failure with hypoxia: Secondary | ICD-10-CM

## 2015-01-18 DIAGNOSIS — I2699 Other pulmonary embolism without acute cor pulmonale: Principal | ICD-10-CM

## 2015-01-18 LAB — COMPREHENSIVE METABOLIC PANEL
ALBUMIN: 2.7 g/dL — AB (ref 3.5–5.0)
ALT: 16 U/L — ABNORMAL LOW (ref 17–63)
ANION GAP: 6 (ref 5–15)
AST: 16 U/L (ref 15–41)
Alkaline Phosphatase: 100 U/L (ref 38–126)
BUN: 32 mg/dL — ABNORMAL HIGH (ref 6–20)
CHLORIDE: 97 mmol/L — AB (ref 101–111)
CO2: 34 mmol/L — AB (ref 22–32)
Calcium: 8.6 mg/dL — ABNORMAL LOW (ref 8.9–10.3)
Creatinine, Ser: 1.61 mg/dL — ABNORMAL HIGH (ref 0.61–1.24)
GFR calc Af Amer: 47 mL/min — ABNORMAL LOW (ref >60)
GFR calc non Af Amer: 40 mL/min — ABNORMAL LOW (ref >60)
GLUCOSE: 164 mg/dL — AB (ref 65–99)
POTASSIUM: 4.2 mmol/L (ref 3.5–5.1)
SODIUM: 137 mmol/L (ref 135–145)
Total Bilirubin: 0.5 mg/dL (ref 0.3–1.2)
Total Protein: 6.8 g/dL (ref 6.5–8.1)

## 2015-01-18 LAB — CBC WITH DIFFERENTIAL/PLATELET
Basophils Absolute: 0.1 10*3/uL (ref 0.0–0.1)
Basophils Relative: 0 %
Eosinophils Absolute: 0.4 10*3/uL (ref 0.0–0.7)
Eosinophils Relative: 2 %
HEMATOCRIT: 37.1 % — AB (ref 39.0–52.0)
Hemoglobin: 11.8 g/dL — ABNORMAL LOW (ref 13.0–17.0)
LYMPHS PCT: 20 %
Lymphs Abs: 3.7 10*3/uL (ref 0.7–4.0)
MCH: 30.7 pg (ref 26.0–34.0)
MCHC: 31.8 g/dL (ref 30.0–36.0)
MCV: 96.6 fL (ref 78.0–100.0)
MONO ABS: 1.4 10*3/uL — AB (ref 0.1–1.0)
MONOS PCT: 8 %
NEUTROS ABS: 12.7 10*3/uL — AB (ref 1.7–7.7)
Neutrophils Relative %: 70 %
Platelets: 262 10*3/uL (ref 150–400)
RBC: 3.84 MIL/uL — ABNORMAL LOW (ref 4.22–5.81)
RDW: 15.4 % (ref 11.5–15.5)
WBC: 18.2 10*3/uL — ABNORMAL HIGH (ref 4.0–10.5)

## 2015-01-18 LAB — GLUCOSE, CAPILLARY
GLUCOSE-CAPILLARY: 173 mg/dL — AB (ref 65–99)
GLUCOSE-CAPILLARY: 197 mg/dL — AB (ref 65–99)
GLUCOSE-CAPILLARY: 240 mg/dL — AB (ref 65–99)
GLUCOSE-CAPILLARY: 239 mg/dL — AB (ref 65–99)
Glucose-Capillary: 214 mg/dL — ABNORMAL HIGH (ref 65–99)

## 2015-01-18 LAB — APTT: aPTT: 76 seconds — ABNORMAL HIGH (ref 24–37)

## 2015-01-18 LAB — PROTIME-INR
INR: 2.02 — ABNORMAL HIGH (ref 0.00–1.49)
Prothrombin Time: 22.7 seconds — ABNORMAL HIGH (ref 11.6–15.2)

## 2015-01-18 LAB — HEPARIN LEVEL (UNFRACTIONATED)

## 2015-01-18 LAB — TROPONIN I: TROPONIN I: 0.11 ng/mL — AB (ref <0.031)

## 2015-01-18 MED ORDER — FUROSEMIDE 10 MG/ML IJ SOLN
40.0000 mg | Freq: Two times a day (BID) | INTRAMUSCULAR | Status: DC
  Administered 2015-01-18 – 2015-01-25 (×15): 40 mg via INTRAVENOUS
  Filled 2015-01-18 (×15): qty 4

## 2015-01-18 MED ORDER — DILTIAZEM HCL ER COATED BEADS 240 MG PO CP24
240.0000 mg | ORAL_CAPSULE | Freq: Every day | ORAL | Status: DC
  Administered 2015-01-18 – 2015-01-24 (×7): 240 mg via ORAL
  Filled 2015-01-18 (×7): qty 1

## 2015-01-18 NOTE — Progress Notes (Signed)
ANTICOAGULATION CONSULT NOTE - follow up  Pharmacy Consult for heparin Indication: atrial fibrillation, PE  Allergies  Allergen Reactions  . Cefaclor     Reactions are unknown  . Cephalexin     Reactions are unknown  . Sulfamethoxazole-Trimethoprim     Reactions are unknown   Patient Measurements: Height: 5\' 8"  (172.7 cm) Weight: 247 lb (112.038 kg) IBW/kg (Calculated) : 68.4 Heparin Dosing Weight: 92.8 kg  Vital Signs: Temp: 98.9 F (37.2 C) (01/05 0340) Temp Source: Oral (01/05 0340) BP: 136/57 mmHg (01/05 0948) Pulse Rate: 105 (01/05 0948)  Labs:  Recent Labs  01/16/15 0650 01/17/15 0536 01/17/15 1230 01/17/15 1827 01/17/15 2014 01/18/15 0009 01/18/15 0532 01/18/15 0539  HGB 13.2 12.2*  --   --   --   --  11.8*  --   HCT 41.1 39.2  --   --   --   --  37.1*  --   PLT 232 248  --   --   --   --  262  --   APTT  --   --  39*  --  81*  --   --  76*  LABPROT  --   --   --   --   --   --  22.7*  --   INR  --   --   --   --   --   --  2.02*  --   HEPARINUNFRC  --   --  2.01*  --  2.01*  --  >2.20*  --   CREATININE  --  1.47*  --   --   --   --  1.61*  --   TROPONINI  --   --  0.10* 0.09*  --  0.11*  --   --    Estimated Creatinine Clearance: 48.1 mL/min (by C-G formula based on Cr of 1.61).  Medical History: Past Medical History  Diagnosis Date  . Unspecified essential hypertension   . Type II or unspecified type diabetes mellitus without mention of complication, not stated as uncontrolled   . Esophageal reflux   . Gout   . Depressive disorder, not elsewhere classified   . OSA (obstructive sleep apnea)     BiPAP  . COPD (chronic obstructive pulmonary disease) (HCC)   . Respiratory failure (HCC)   . Atrial fibrillation (HCC)   . BPH (benign prostatic hyperplasia)   . Pleural effusion, left 06/14/2007        . Pulmonary hypertension (HCC)   . Cor pulmonale (HCC)     R heart failure  . Diverticulosis   . CAD (coronary artery disease)     non-obsctructive    . Obesity   . History of nuclear stress test 06/18/2006    [facility] Hawking; normal myoview with diminished oxygen sats   . CHF (congestive heart failure) (HCC)    Medications:  Prescriptions prior to admission  Medication Sig Dispense Refill Last Dose  . allopurinol (ZYLOPRIM) 300 MG tablet Take 300 mg by mouth daily.     Past Week at Unknown time  . aspirin 81 MG tablet Take 243 mg by mouth daily.   Past Week at Unknown time  . dextromethorphan-guaiFENesin (MUCINEX DM) 30-600 MG per 12 hr tablet Take 1 tablet by mouth 2 (two) times daily as needed for cough.    unknown  . docusate sodium (STOOL SOFTENER) 100 MG capsule Take 100 mg by mouth daily.   Past Week at Unknown time  .  esomeprazole (NEXIUM) 40 MG capsule Take 40 mg by mouth every other day.    Past Week at Unknown time  . finasteride (PROSCAR) 5 MG tablet Take 5 mg by mouth daily.   Past Week at Unknown time  . furosemide (LASIX) 40 MG tablet Take 80 mg by mouth daily. \   Past Week at Unknown time  . ibuprofen (ADVIL,MOTRIN) 800 MG tablet Take 800 mg by mouth 2 (two) times daily as needed for mild pain or moderate pain.    Past Week at Unknown time  . insulin aspart (NOVOLOG FLEXPEN) 100 UNIT/ML FlexPen Inject 60 Units into the skin 3 (three) times daily with meals.   01/11/2015 at Unknown time  . insulin glargine (LANTUS) 100 UNIT/ML injection Inject 75 Units into the skin 2 (two) times daily.    01/11/2015 at Unknown time  . levalbuterol (XOPENEX HFA) 45 MCG/ACT inhaler Inhale 1-2 puffs into the lungs every 4 (four) hours as needed. 1 Inhaler 3 unknown  . metolazone (ZAROXOLYN) 5 MG tablet Take 5 mg by mouth once a week. Takes as needed for fluid retention   unknown  . OXYGEN Inhale into the lungs at bedtime.   01/11/2015 at Unknown time  . potassium chloride (K-DUR) 10 MEQ tablet Take 20 mEq by mouth daily.   Past Week at Unknown time  . triamcinolone (NASACORT) 55 MCG/ACT nasal inhaler Place 2 sprays into the nose daily as needed (for  congestion).    unknown   Assessment: 75 yo man started who was on eliquis and now changed to heparin due to hemoptysis.   We will likely need to use aPTT to guide therapy initially.  He is coughing up some blood.  Goal of Therapy:  aPTT 66-102 seconds Monitor platelets by anticoagulation protocol: Yes  Heparin level 0.3-0.7 units/ml   Plan:  Continue heparin drip at 1350 units/hr  Check baseline aPTT and heparin level. Check aPTT daily  Monitor for bleeding complications.  Thanks for allowing pharmacy to be a part of this patient's care.  Valrie HartScott Elaf Clauson, PharmD Clinical Pharmacist  01/18/2015,12:28 PM

## 2015-01-18 NOTE — Progress Notes (Signed)
BIPAP checked pt resting well. No distress

## 2015-01-18 NOTE — Progress Notes (Signed)
[facility]

## 2015-01-18 NOTE — Progress Notes (Signed)
Subjective: He says he feels a little better. He still short of breath. He is having less sputum production  Objective: Vital signs in last 24 hours: Temp:  [98 F (36.7 C)-98.9 F (37.2 C)] 98.9 F (37.2 C) (01/05 0340) Pulse Rate:  [70-120] 98 (01/05 0340) Resp:  [20] 20 (01/05 0340) BP: (122-153)/(45-81) 140/45 mmHg (01/05 0340) SpO2:  [92 %-99 %] 92 % (01/05 0340) Weight change:  Last BM Date: 12/20/2014  Intake/Output from previous day: 01/04 0701 - 01/05 0700 In: 360 [P.O.:360] Out: -   PHYSICAL EXAM General appearance: alert, cooperative, mild distress and morbidly obese Resp: clear to auscultation bilaterally Cardio: irregularly irregular rhythm GI: soft, non-tender; bowel sounds normal; no masses,  no organomegaly Extremities: extremities normal, atraumatic, no cyanosis or edema  Lab Results:  Results for orders placed or performed during the hospital encounter of 12/18/2014 (from the past 48 hour(s))  Glucose, capillary     Status: Abnormal   Collection Time: 01/16/15 11:44 AM  Result Value Ref Range   Glucose-Capillary 259 (H) 65 - 99 mg/dL   Comment 1 Notify RN    Comment 2 Document in Chart   Glucose, capillary     Status: None   Collection Time: 01/16/15 12:23 PM  Result Value Ref Range   Glucose-Capillary 85 65 - 99 mg/dL   Comment 1 Repeat Test   Glucose, capillary     Status: Abnormal   Collection Time: 01/16/15  4:21 PM  Result Value Ref Range   Glucose-Capillary 248 (H) 65 - 99 mg/dL   Comment 1 Notify RN    Comment 2 Document in Chart   Glucose, capillary     Status: Abnormal   Collection Time: 01/16/15  9:56 PM  Result Value Ref Range   Glucose-Capillary 229 (H) 65 - 99 mg/dL  CBC     Status: Abnormal   Collection Time: 01/17/15  5:36 AM  Result Value Ref Range   WBC 18.2 (H) 4.0 - 10.5 K/uL   RBC 4.08 ([name]) 4.22 - 5.81 MIL/uL   Hemoglobin 12.2 ([name]) 13.0 - 17.0 g/dL   HCT 39.2 39.0 - 52.0 %   MCV 96.1 78.0 - 100.0 fL   MCH 29.9 26.0 - 34.0 pg    MCHC 31.1 30.0 - 36.0 g/dL   RDW 15.2 11.5 - 15.5 %   Platelets 248 150 - 400 K/uL  Basic metabolic panel     Status: Abnormal   Collection Time: 01/17/15  5:36 AM  Result Value Ref Range   Sodium 139 135 - 145 mmol/[name]   Potassium 4.1 3.5 - 5.1 mmol/[name]   Chloride 98 ([name]) 101 - 111 mmol/[name]   CO2 35 (H) 22 - 32 mmol/[name]   Glucose, Bld 188 (H) 65 - 99 mg/dL   BUN 34 (H) 6 - 20 mg/dL   Creatinine, Ser 1.47 (H) 0.61 - 1.24 mg/dL   Calcium 8.6 ([name]) 8.9 - 10.3 mg/dL   GFR calc non Af Amer 45 ([name]) >60 mL/min   GFR calc Af Amer 52 ([name]) >60 mL/min    Comment: (NOTE) The eGFR has been calculated using the CKD EPI equation. This calculation has not been validated in all clinical situations. eGFR's persistently <60 mL/min signify possible Chronic Kidney Disease.    Anion gap 6 5 - 15  Glucose, capillary     Status: Abnormal   Collection Time: 01/17/15  7:56 AM  Result Value Ref Range   Glucose-Capillary 168 (H) 65 - 99 mg/dL  Glucose, capillary     Status: Abnormal   Collection Time: 01/17/15 12:23 PM  Result Value Ref Range   Glucose-Capillary 256 (H) 65 - 99 mg/dL  Heparin level (unfractionated)     Status: Abnormal   Collection Time: 01/17/15 12:30 PM  Result Value Ref Range   Heparin Unfractionated 2.01 (H) 0.30 - 0.70 IU/mL    Comment:        IF HEPARIN RESULTS ARE BELOW EXPECTED VALUES, AND PATIENT DOSAGE HAS BEEN CONFIRMED, SUGGEST FOLLOW UP TESTING OF ANTITHROMBIN III LEVELS.   APTT     Status: Abnormal   Collection Time: 01/17/15 12:30 PM  Result Value Ref Range   aPTT 39 (H) 24 - 37 seconds    Comment:        IF BASELINE aPTT IS ELEVATED, SUGGEST PATIENT RISK ASSESSMENT BE USED TO DETERMINE APPROPRIATE ANTICOAGULANT THERAPY.   Troponin I (q 6hr x 3)     Status: Abnormal   Collection Time: 01/17/15 12:30 PM  Result Value Ref Range   Troponin I 0.10 (H) <0.031 ng/mL    Comment:        PERSISTENTLY INCREASED TROPONIN VALUES IN THE RANGE OF 0.04-0.49 ng/mL CAN BE SEEN  IN:       -UNSTABLE ANGINA       -CONGESTIVE HEART FAILURE       -MYOCARDITIS       -CHEST TRAUMA       -ARRYHTHMIAS       -LATE PRESENTING MYOCARDIAL INFARCTION       -COPD   CLINICAL FOLLOW-UP RECOMMENDED.   Glucose, capillary     Status: Abnormal   Collection Time: 01/17/15  2:11 PM  Result Value Ref Range   Glucose-Capillary 277 (H) 65 - 99 mg/dL   Comment 1 Notify RN   Glucose, capillary     Status: Abnormal   Collection Time: 01/17/15  4:01 PM  Result Value Ref Range   Glucose-Capillary 243 (H) 65 - 99 mg/dL   Comment 1 Notify RN    Comment 2 Document in Chart   Troponin I (q 6hr x 3)     Status: Abnormal   Collection Time: 01/17/15  6:27 PM  Result Value Ref Range   Troponin I 0.09 (H) <0.031 ng/mL    Comment:        PERSISTENTLY INCREASED TROPONIN VALUES IN THE RANGE OF 0.04-0.49 ng/mL CAN BE SEEN IN:       -UNSTABLE ANGINA       -CONGESTIVE HEART FAILURE       -MYOCARDITIS       -CHEST TRAUMA       -ARRYHTHMIAS       -LATE PRESENTING MYOCARDIAL INFARCTION       -COPD   CLINICAL FOLLOW-UP RECOMMENDED.   APTT     Status: Abnormal   Collection Time: 01/17/15  8:14 PM  Result Value Ref Range   aPTT 81 (H) 24 - 37 seconds    Comment:        IF BASELINE aPTT IS ELEVATED, SUGGEST PATIENT RISK ASSESSMENT BE USED TO DETERMINE APPROPRIATE ANTICOAGULANT THERAPY.   Heparin level (unfractionated)     Status: Abnormal   Collection Time: 01/17/15  8:14 PM  Result Value Ref Range   Heparin Unfractionated 2.01 (H) 0.30 - 0.70 IU/mL    Comment:        IF HEPARIN RESULTS ARE BELOW EXPECTED VALUES, AND PATIENT DOSAGE HAS BEEN CONFIRMED, SUGGEST FOLLOW UP TESTING OF ANTITHROMBIN III LEVELS.  RESULTS CONFIRMED BY MANUAL DILUTION   Glucose, capillary     Status: Abnormal   Collection Time: 01/17/15  9:13 PM  Result Value Ref Range   Glucose-Capillary 206 (H) 65 - 99 mg/dL   Comment 1 Notify RN    Comment 2 Document in Chart   Troponin I (q 6hr x 3)     Status:  Abnormal   Collection Time: 01/18/15 12:09 AM  Result Value Ref Range   Troponin I 0.11 (H) <0.031 ng/mL    Comment:        PERSISTENTLY INCREASED TROPONIN VALUES IN THE RANGE OF 0.04-0.49 ng/mL CAN BE SEEN IN:       -UNSTABLE ANGINA       -CONGESTIVE HEART FAILURE       -MYOCARDITIS       -CHEST TRAUMA       -ARRYHTHMIAS       -LATE PRESENTING MYOCARDIAL INFARCTION       -COPD   CLINICAL FOLLOW-UP RECOMMENDED.   Comprehensive metabolic panel     Status: Abnormal   Collection Time: 01/18/15  5:32 AM  Result Value Ref Range   Sodium 137 135 - 145 mmol/[name]   Potassium 4.2 3.5 - 5.1 mmol/[name]   Chloride 97 ([name]) 101 - 111 mmol/[name]   CO2 34 (H) 22 - 32 mmol/[name]   Glucose, Bld 164 (H) 65 - 99 mg/dL   BUN 32 (H) 6 - 20 mg/dL   Creatinine, Ser 1.61 (H) 0.61 - 1.24 mg/dL   Calcium 8.6 ([name]) 8.9 - 10.3 mg/dL   Total Protein 6.8 6.5 - 8.1 g/dL   Albumin 2.7 ([name]) 3.5 - 5.0 g/dL   AST 16 15 - 41 U/[name]   ALT 16 ([name]) 17 - 63 U/[name]   Alkaline Phosphatase 100 38 - 126 U/[name]   Total Bilirubin 0.5 0.3 - 1.2 mg/dL   GFR calc non Af Amer 40 ([name]) >60 mL/min   GFR calc Af Amer 47 ([name]) >60 mL/min    Comment: (NOTE) The eGFR has been calculated using the CKD EPI equation. This calculation has not been validated in all clinical situations. eGFR's persistently <60 mL/min signify possible Chronic Kidney Disease.    Anion gap 6 5 - 15  CBC with Differential/Platelet     Status: Abnormal   Collection Time: 01/18/15  5:32 AM  Result Value Ref Range   WBC 18.2 (H) 4.0 - 10.5 K/uL   RBC 3.84 ([name]) 4.22 - 5.81 MIL/uL   Hemoglobin 11.8 ([name]) 13.0 - 17.0 g/dL   HCT 37.1 ([name]) 39.0 - 52.0 %   MCV 96.6 78.0 - 100.0 fL   MCH 30.7 26.0 - 34.0 pg   MCHC 31.8 30.0 - 36.0 g/dL   RDW 15.4 11.5 - 15.5 %   Platelets 262 150 - 400 K/uL   Neutrophils Relative % 70 %   Neutro Abs 12.7 (H) 1.7 - 7.7 K/uL   Lymphocytes Relative 20 %   Lymphs Abs 3.7 0.7 - 4.0 K/uL   Monocytes Relative 8 %   Monocytes Absolute 1.4 (H) 0.1 - 1.0 K/uL    Eosinophils Relative 2 %   Eosinophils Absolute 0.4 0.0 - 0.7 K/uL   Basophils Relative 0 %   Basophils Absolute 0.1 0.0 - 0.1 K/uL  Protime-INR     Status: Abnormal   Collection Time: 01/18/15  5:32 AM  Result Value Ref Range   Prothrombin Time 22.7 (H) 11.6 - 15.2 seconds   INR 2.02 (H) 0.00 -  1.49  APTT     Status: Abnormal   Collection Time: 01/18/15  5:39 AM  Result Value Ref Range   aPTT 76 (H) 24 - 37 seconds    Comment:        IF BASELINE aPTT IS ELEVATED, SUGGEST PATIENT RISK ASSESSMENT BE USED TO DETERMINE APPROPRIATE ANTICOAGULANT THERAPY.   Glucose, capillary     Status: Abnormal   Collection Time: 01/18/15  8:16 AM  Result Value Ref Range   Glucose-Capillary 173 (H) 65 - 99 mg/dL    ABGS No results for input(s): PHART, PO2ART, TCO2, HCO3 in the last 72 hours.  Invalid input(s): PCO2 CULTURES Recent Results (from the past 240 hour(s))  Culture, Urine     Status: None   Collection Time: 01/09/2015 11:50 PM  Result Value Ref Range Status   Specimen Description URINE, CLEAN CATCH  Final   Special Requests NONE  Final   Culture   Final    MULTIPLE SPECIES PRESENT, SUGGEST RECOLLECTION Performed at [facility]    Report Status 01/15/2015 FINAL  Final  MRSA PCR Screening     Status: None   Collection Time: 01/13/15  1:09 AM  Result Value Ref Range Status   MRSA by PCR NEGATIVE NEGATIVE Final    Comment:        The GeneXpert MRSA Assay (FDA approved for NASAL specimens only), is one component of a comprehensive MRSA colonization surveillance program. It is not intended to diagnose MRSA infection nor to guide or monitor treatment for MRSA infections.    Studies/Results: Dg Chest Port 1 View  01/17/2015  CLINICAL DATA:  Pneumonia. EXAM: PORTABLE CHEST 1 VIEW COMPARISON:  January 12, 2015. FINDINGS: Stable cardiomegaly is noted. No pneumothorax is noted. Central pulmonary vascular congestion is again noted. Mild right basilar opacity is noted  concerning for edema or subsegmental atelectasis. Stable moderate left pleural effusion is noted with underlying atelectasis, edema or infiltrate. Bony thorax is unremarkable. IMPRESSION: Stable cardiomegaly and central pulmonary vascular congestion. Stable moderate left pleural effusion with underlying atelectasis, infiltrate or edema. Electronically Signed   By: Marijo Conception, M.D.   On: 01/17/2015 12:55    Medications:  Prior to Admission:  Prescriptions prior to admission  Medication Sig Dispense Refill Last Dose  . allopurinol (ZYLOPRIM) 300 MG tablet Take 300 mg by mouth daily.     Past Week at Unknown time  . aspirin 81 MG tablet Take 243 mg by mouth daily.   Past Week at Unknown time  . dextromethorphan-guaiFENesin (MUCINEX DM) 30-600 MG per 12 hr tablet Take 1 tablet by mouth 2 (two) times daily as needed for cough.    unknown  . docusate sodium (STOOL SOFTENER) 100 MG capsule Take 100 mg by mouth daily.   Past Week at Unknown time  . esomeprazole (NEXIUM) 40 MG capsule Take 40 mg by mouth every other day.    Past Week at Unknown time  . finasteride (PROSCAR) 5 MG tablet Take 5 mg by mouth daily.   Past Week at Unknown time  . furosemide (LASIX) 40 MG tablet Take 80 mg by mouth daily. \   Past Week at Unknown time  . ibuprofen (ADVIL,MOTRIN) 800 MG tablet Take 800 mg by mouth 2 (two) times daily as needed for mild pain or moderate pain.    Past Week at Unknown time  . insulin aspart (NOVOLOG FLEXPEN) 100 UNIT/ML FlexPen Inject 60 Units into the skin 3 (three) times daily with meals.  01/11/2015 at Unknown time  . insulin glargine (LANTUS) 100 UNIT/ML injection Inject 75 Units into the skin 2 (two) times daily.    01/11/2015 at Unknown time  . levalbuterol (XOPENEX HFA) 45 MCG/ACT inhaler Inhale 1-2 puffs into the lungs every 4 (four) hours as needed. 1 Inhaler 3 unknown  . metolazone (ZAROXOLYN) 5 MG tablet Take 5 mg by mouth once a week. Takes as needed for fluid retention   unknown   . OXYGEN Inhale into the lungs at bedtime.   01/11/2015 at Unknown time  . potassium chloride (K-DUR) 10 MEQ tablet Take 20 mEq by mouth daily.   Past Week at Unknown time  . triamcinolone (NASACORT) 55 MCG/ACT nasal inhaler Place 2 sprays into the nose daily as needed (for congestion).    unknown   Scheduled: . allopurinol  300 mg Oral Daily  . diltiazem  240 mg Oral Daily  . docusate sodium  100 mg Oral Daily  . finasteride  5 mg Oral Daily  . fluconazole  100 mg Oral Daily  . insulin aspart  0-15 Units Subcutaneous TID WC  . insulin aspart  0-5 Units Subcutaneous QHS  . insulin aspart  4 Units Subcutaneous TID WC  . insulin glargine  45 Units Subcutaneous QHS  . levofloxacin (LEVAQUIN) IV  500 mg Intravenous Q24H  . lidocaine  5 mL Mouth/Throat TID   And  . magic mouthwash  5 mL Oral TID  . pantoprazole (PROTONIX) IV  40 mg Intravenous Q24H  . potassium chloride  20 mEq Oral Daily   Continuous: . heparin 1,350 Units/hr (01/18/15 0816)   ZOX:[id number], albuterol, dextromethorphan-guaiFENesin, magic mouthwash, ondansetron **OR** ondansetron (ZOFRAN) IV  Assesment: Consultation was requested for hemoptysis in the setting of acute large pulmonary embolism. He has chronic respiratory failure at baseline. He has permanent atrial fibrillation at baseline. I think he may have had a pulmonary infarction and I started him on antibiotics and is coughing up less blood. 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 He does seem to be slowly improving    LOS: 5 days   [name] [name] 01/18/2015, 8:40 AM

## 2015-01-18 NOTE — Consult Note (Signed)
Name: [name] [name] MRN: [id number] DOB: 04-Mar-1940    ADMISSION DATE:  2015-02-11 CONSULTATION DATE:  1/5  REFERRING MD :  Dr. Mahala Menghini  CHIEF COMPLAINT:  Effusion  BRIEF PATIENT DESCRIPTION: 75 year old male presented to [facility] 12/31 with hemoptysis. Found to have large left pulmonary embolism. 1/5 had increased work of breathing and increased oxygen demands. He was transferred to [facility] for further evaluation.  SIGNIFICANT EVENTS  12/31 > admit 1/5 > transfer [facility].  STUDIES:  Echo 03/08/07 >> EF 55%, mild diastolic dysfx, mild/mod LA/RA dilation, mild MR, mild TR, PAS 25 mmHg Spirometry 06/01/12 >> FEV1 1.52 (58%), FEV1% 70; mixed obstructive/restrictive defect BiPAP 09/28/13 to 10/27/13 >> used on 30 of 30 nights with average 14 hrs and 29 min. Average AHI is 1.7 with BiPAP 12/8 cm H2O. CT angiogram chest 12/31> large pulmonary embolism in the left main pulmonary artery extending into the left lower lobe. Atelectasis in the left lower lobe. Chronic dilation of distal esophagus. Echo 1/4 >  Moderate LVH, EF 65- 70%, no RWMA's, possible anterior pericardial effusion vs tissue density.  HISTORY OF PRESENT ILLNESS:  75 year old male with past medical history as below, which includes diabetes, GERD, OSA/OHS on nocturnal BiPAP, COPD (followed by VS), atrial fibrillation, chronic left pleural effusion, and hypertension. He presented to [facility] emergency department 12/31 complaining of dyspnea and hemoptysis for about 2 hours. In the emergency department history upon was elevated and he had some degree of acute renal failure. It was suspected that he had pulmonary embolism and he was admitted to the hospitalist team. CT angios of the chest confirmed left lung pulmonary embolism. He was initially started on heparin due to his presentation with hemoptysis, however, family reported that hemoptysis was pretty mild so he was transitioned Eliquis. Unfortunately the next morning he  did developed hemoptysis again, which has persisted throughout his hospitalization. Pulmonology was consulted 1/4 and raised the possibility of pulmonary infarction. The patient was started on antibiotics. Hospital course complicated by dysphagia, of note the patient has a known esophageal motility disorder and esophageal diverticulum. EGD was scheduled however, gastroenterology felt the patient was too unstable to undergo EGD. 1/5 the patient developed worsening dyspnea and had increasing oxygen demands from 2 L nasal cannula up to 5 L nasal cannula. chest x-ray was obtained and demonstrated a new opacification of the left hemithorax. The patient was transferred to [facility] for further evaluation, and bedside ultrasound assessment of the left pleural space.  PAST MEDICAL HISTORY :   has a past medical history of Unspecified essential hypertension; Type II or unspecified type diabetes mellitus without mention of complication, not stated as uncontrolled; Esophageal reflux; Gout; Depressive disorder, not elsewhere classified; OSA (obstructive sleep apnea); COPD (chronic obstructive pulmonary disease) (HCC); Respiratory failure (HCC); Atrial fibrillation (HCC); BPH (benign prostatic hyperplasia); Pleural effusion, left (06/14/2007); Pulmonary hypertension (HCC); Cor pulmonale (HCC); Diverticulosis; CAD (coronary artery disease); Obesity; History of nuclear stress test (06/18/2006); and CHF (congestive heart failure) (HCC).  has past surgical history that includes TURP vaporization; Cholecystectomy; Tonsillectomy; Inguinal hernia repair; transthoracic echocardiogram (03/08/2007); and Cardiac catheterization (08/25/2006). Prior to Admission medications   Medication Sig Start Date End Date Taking? Authorizing Provider  allopurinol (ZYLOPRIM) 300 MG tablet Take 300 mg by mouth daily.     Yes Historical Provider, MD  aspirin 81 MG tablet Take 243 mg by mouth daily.   Yes Historical Provider, MD    dextromethorphan-guaiFENesin (MUCINEX DM) 30-600 MG per 12 hr  tablet Take 1 tablet by mouth 2 (two) times daily as needed for cough.    Yes Historical Provider, MD  docusate sodium (STOOL SOFTENER) 100 MG capsule Take 100 mg by mouth daily.   Yes Historical Provider, MD  esomeprazole (NEXIUM) 40 MG capsule Take 40 mg by mouth every other day.    Yes Historical Provider, MD  finasteride (PROSCAR) 5 MG tablet Take 5 mg by mouth daily. 10/24/14  Yes Historical Provider, MD  furosemide (LASIX) 40 MG tablet Take 80 mg by mouth daily. \   Yes Historical Provider, MD  ibuprofen (ADVIL,MOTRIN) 800 MG tablet Take 800 mg by mouth 2 (two) times daily as needed for mild pain or moderate pain.  10/24/14  Yes Historical Provider, MD  insulin aspart (NOVOLOG FLEXPEN) 100 UNIT/ML FlexPen Inject 60 Units into the skin 3 (three) times daily with meals.   Yes Historical Provider, MD  insulin glargine (LANTUS) 100 UNIT/ML injection Inject 75 Units into the skin 2 (two) times daily.    Yes Historical Provider, MD  levalbuterol [facility] HFA) 45 MCG/ACT inhaler Inhale 1-2 puffs into the lungs every 4 (four) hours as needed. 01/04/14  Yes Coralyn Helling, MD  metolazone (ZAROXOLYN) 5 MG tablet Take 5 mg by mouth once a week. Takes as needed for fluid retention   Yes Historical Provider, MD  OXYGEN Inhale into the lungs at bedtime.   Yes Historical Provider, MD  potassium chloride (K-DUR) 10 MEQ tablet Take 20 mEq by mouth daily. 01/09/15  Yes Historical Provider, MD  triamcinolone (NASACORT) 55 MCG/ACT nasal inhaler Place 2 sprays into the nose daily as needed (for congestion).    Yes Historical Provider, MD   Allergies  Allergen Reactions  . Cefaclor     Reactions are unknown  . Cephalexin     Reactions are unknown  . Sulfamethoxazole-Trimethoprim     Reactions are unknown    FAMILY HISTORY:  family history includes Prostate cancer in his father. SOCIAL HISTORY:  reports that he has never smoked. He does not have  any smokeless tobacco history on file. He reports that he does not drink alcohol or use illicit drugs.  REVIEW OF SYSTEMS: Limited due to pt falling asleep frequently. All negative; except for those that are bolded, which indicate positives.  Constitutional: weight loss, weight gain, night sweats, fevers, chills, fatigue, weakness.  HEENT: headaches, sore throat, sneezing, nasal congestion, post nasal drip, difficulty swallowing, tooth/dental problems, visual complaints, visual changes, ear aches. Neuro: difficulty with speech, weakness, numbness, ataxia. CV:  chest pain, orthopnea, PND, swelling in lower extremities, dizziness, palpitations, syncope.  Resp: cough, hemoptysis, dyspnea, wheezing. GI  heartburn, indigestion, abdominal pain, nausea, vomiting, diarrhea, constipation, change in bowel habits, loss of appetite, hematemesis, melena, hematochezia.  GU: dysuria, change in color of urine, urgency or frequency, flank pain, hematuria. MSK: joint pain or swelling, decreased range of motion. Psych: change in mood or affect, depression, anxiety, suicidal ideations, homicidal ideations. Skin: rash, itching, bruising.   SUBJECTIVE:  Resting comfortably on BiPAP.  Awakens easily but falls back asleep making history difficult to obtain.  VITAL SIGNS: Temp:  [98.4 F (36.9 C)-98.9 F (37.2 C)] 98.9 F (37.2 C) (01/05 0340) Pulse Rate:  [57-105] 57 (01/05 1725) Resp:  [20-36] 36 (01/05 1725) BP: (108-140)/(45-68) 108/59 mmHg (01/05 1705) SpO2:  [91 %-94 %] 92 % (01/05 1725)  PHYSICAL EXAMINATION: General: Elderly male, sleeping, in NAD. Neuro: Sleepy, awakes easily to voice but falls back to sleep quickly. HEENT: [place]/AT. PERRL,  sclerae anicteric. Cardiovascular: RRR, no M/R/G.  Lungs: Respirations even and unlabored.  BS diminished bilaterally L > R. No W/R/R. Abdomen: BS x 4, soft, NT/ND.  Musculoskeletal: No gross deformities, no edema.  Skin: Intact, warm, no rashes.     Recent  Labs Lab 01/13/15 0446 01/17/15 0536 01/18/15 0532  NA 135 139 137  K 3.1* 4.1 4.2  CL 95* 98* 97*  CO2 31 35* 34*  BUN 47* 34* 32*  CREATININE 1.67* 1.47* 1.61*  GLUCOSE 314* 188* 164*    Recent Labs Lab 01/16/15 0650 01/17/15 0536 01/18/15 0532  HGB 13.2 12.2* 11.8*  HCT 41.1 39.2 37.1*  WBC 16.2* 18.2* 18.2*  PLT 232 248 262   Dg Chest Port 1 View  01/18/2015  CLINICAL DATA:  Acute worsening of shortness of breath this afternoon. Followup left pleural effusion and left lower lobe atelectasis versus pneumonia. Acute left lower lobe pulmonary embolus 01/13/2015. EXAM: PORTABLE CHEST 1 VIEW COMPARISON:  01/17/2015 and earlier, including CTA chest 01/13/2015. FINDINGS: Interval near complete opacification of the left hemithorax since yesterday, with only minimal aerated lung the left apex. Cardiac silhouette enlarged, though obscured by the opacified left hemithorax. Increased pulmonary vascular congestion without overt edema. Stable mild atelectasis at the right lung base. No visible right pleural effusion. IMPRESSION: 1. Near complete opacification of the left hemithorax with consolidation now present throughout most of the left lung. As there is no significant mediastinal shift, this is likely a combination of worsening atelectasis and worsening pneumonia. 2. Stable mild right basilar atelectasis. 3. Worsening pulmonary vascular congestion without overt edema. These results will be called to the ordering clinician or representative by the Radiologist Assistant, and communication documented in the PACS or zVision Dashboard. Electronically Signed   By: Hulan Saas M.D.   On: 01/18/2015 17:32   Dg Chest Port 1 View  01/17/2015  CLINICAL DATA:  Pneumonia. EXAM: PORTABLE CHEST 1 VIEW COMPARISON:  January 14, 2015. FINDINGS: Stable cardiomegaly is noted. No pneumothorax is noted. Central pulmonary vascular congestion is again noted. Mild right basilar opacity is noted concerning for edema  or subsegmental atelectasis. Stable moderate left pleural effusion is noted with underlying atelectasis, edema or infiltrate. Bony thorax is unremarkable. IMPRESSION: Stable cardiomegaly and central pulmonary vascular congestion. Stable moderate left pleural effusion with underlying atelectasis, infiltrate or edema. Electronically Signed   By: Lupita Raider, M.D.   On: 01/17/2015 12:55    ASSESSMENT / PLAN:  Left hemithorax opacification on CXR - has chornic L effusion which is known to be complicated and he has been deemed a not a surgical candidate in regards to this effusion in the past. Worse on CXR 1/5 with extension to the entire L hemithorax.  Questionable increased effusion vs atelectasis vs mucous plug; favor atelectasis. - Attempted to assess pleural space via ultrasound; however, pt too sleep to participate / cooperate (this was at 2am). - Day team to please assess pleural space via ultrasound to further evaluate. - If significant effusion present, will need to hold heparin prior to thoracentesis.  Acute on chronic respiratory failure secondary to pulmonary embolism on chronic OHS, OSA, and COPD -Supplemental O2 prn during waking hours. Keep SpO2 88-92% -BiPAP QHS -PRN albuterol -IS and flutter as tolerated  Pulmonary embolism with suspected lung infarction Hemoptysis - Eliquis changed to heparin 1/5, continue heparin per pharmacy - No role systemic or catheter directed lysis. - Assess LE dopplers - Continue IV levaquin    Rutherford Guys, PA -  [facility] Pager: [phone]  or [phone] 01/19/2015, 2:10 AM

## 2015-01-18 NOTE — Progress Notes (Signed)
1705 Patient stated that he felt more short of breath than he had been O2 sats on 3L [place] 83%.  O2 increased to 5L and sats 93-94%.  VS per flowsheet.  Central telemetry called RN and stated patient's heart rate had been in 50s.  Dr. Mahala MenghiniSamtani notified and stated on way to see patient.

## 2015-01-18 NOTE — Progress Notes (Signed)
PT Cancellation Note  Patient Details Name: [name] GrateWilliam H [name] MRN: [id number] DOB: 04/12/1940   Cancelled Treatment:    Reason Eval/Treat Not Completed: Patient declined, no reason specified. Chart reviewed, RN consulted: pt is refusing pt eval at this time. He says he feels worse than yesterday, but does not explain. Subjective history and baseline information gathered from wife at bedside. Pt understands that he must be evaluated for DC recommendations. Will attempt again at later date/time.   Sammy Cassar C 01/18/2015, 10:14 AM 10:16 AM  Rosamaria LintsAllan C Dorean Hiebert, PT, DPT [place] License # [id number]

## 2015-01-18 NOTE — Significant Event (Addendum)
RN notified me ? WOB ?  O2 req from 2-->5 Liter    O/E    slightly more tired stony percussion note  L post hemithorax white-out noted on CXR    Will try IV lasix 40 x 1 Note 1.5 liter + i/o and hasn't taken his PTA lasix and Metolazone since admit Try on Bipap home settings to ventilate qhs  D/w Dr. Lovell SheehanJenkins of Gen surg who doesn't routinely perform Pleural taps D/w Dr. Marlyne BeardsJennings of CCM Cannot reliably tell if fluid vs infarct given infarct on L side Already on Levaquin-I am not convinced infectious at this point Agreeable to see in consult and will bedside US to determine how much of this is fluid and might do pleural tap if prn  [facility] for evaulation by CCM for possible thoracetesis   Pleas KochJai Leona Pressly, MD [facility] [phone]

## 2015-01-18 NOTE — Consult Note (Signed)
CARDIOLOGY CONSULT NOTE       Patient ID: [name] [name] MRN: [id number] DOB/AGE: [birth year] 75 y.o.  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 chronic afib, OSA, cor pulmonale COPD and extremely sedentary.  Has been on anticoagulation with some compliance issues.  Admitted with increasing dyspnea and large PE Subsequently developed hemoptysis and eliquis changed to heparin.  Seen by pulmonary ? Pulmonary infarct and being covered with antibiotics.  ? Having EGD latter today.  He has very poor functional status.  Most history from wife.  Cath 2008 no CAD No chest pain, syncope or palpitations No previous bleeding issues on anticoagulation or TIA.    ROS All other systems reviewed and negative except as noted above  Past Medical History  Diagnosis Date  . Unspecified essential hypertension   . Type II or unspecified type diabetes mellitus without mention of complication, not stated as uncontrolled   . Esophageal reflux   . Gout   . Depressive disorder, not elsewhere classified   . OSA (obstructive sleep apnea)     BiPAP  . COPD (chronic obstructive pulmonary disease) (HCC)   . Respiratory failure (HCC)   . Atrial fibrillation (HCC)   . BPH (benign prostatic hyperplasia)   . Pleural effusion, left 06/14/2007        . Pulmonary hypertension (HCC)   . Cor pulmonale (HCC)     R heart failure  . Diverticulosis   . CAD (coronary artery disease)     non-obsctructive   . Obesity   .  History of nuclear stress test 06/18/2006    [facility]; normal myoview with diminished oxygen sats   . CHF (congestive heart failure) (HCC)     Family History  Problem Relation Age of Onset  . Prostate cancer Father     Social History   Social History  . Marital Status: Married    Spouse Name: N/A  . Number of Children: N/A  . Years of Education: N/A   Occupational History  . Retired    Social History Main Topics  . Smoking status: Never Smoker   . Smokeless tobacco: Not on file     Comment: Remote history of tobacco history  . Alcohol Use: No  . Drug Use: No  . Sexual Activity: Not on file   Other Topics Concern  . Not on file   Social History Narrative    Past Surgical History  Procedure Laterality Date  . Turp vaporization    . Cholecystectomy    . Tonsillectomy    . Inguinal hernia repair    . Transthoracic echocardiogram  03/08/2007    EF 55%; mild diastolic dysfunction; mild aortic valve stenosis with trivial AV regurg; mild MR; LA mild-mod dilated; RV mildly dilated; trivial pulm regurg; mild TR; RA mil-mod dilated  . Cardiac catheterization  08/25/2006    mild pulm htn; mild atherosclerosis but no significant CAD     . allopurinol  300 mg Oral Daily  . diltiazem  240 mg  Oral Daily  . docusate sodium  100 mg Oral Daily  . finasteride  5 mg Oral Daily  . fluconazole  100 mg Oral Daily  . insulin aspart  0-15 Units Subcutaneous TID WC  . insulin aspart  0-5 Units Subcutaneous QHS  . insulin aspart  4 Units Subcutaneous TID WC  . insulin glargine  45 Units Subcutaneous QHS  . levofloxacin (LEVAQUIN) IV  500 mg Intravenous Q24H  . lidocaine  5 mL Mouth/Throat TID   And  . magic mouthwash  5 mL Oral TID  . pantoprazole (PROTONIX) IV  40 mg Intravenous Q24H  . potassium chloride  20 mEq Oral Daily   . heparin 1,350 Units/hr (01/18/15 0816)    Physical Exam:  BP 136/57 mmHg  Pulse 105  Temp(Src) 98.9 F (37.2 C) (Oral)  Resp 20  Ht 5\' 8"  (1.727 m)   Wt 112.038 kg (247 lb)  BMI 37.56 kg/m2  SpO2 91%   Affect Flat Chronically ill white obese male  HEENT: blood oral mucosa Neck supple with no adenopathy JVP normal no bruits no thyromegaly Lungs:  inspitory effort poor wheezing left greater than right  Heart:  S1/S2 no murmur, no rub, gallop or click PMI normal Abdomen: benighn, BS positve, no tenderness, no AAA no bruit.  No HSM or HJR Distal pulses intact with no bruits Plus one bilateal  edema Neuro non-focal Skin warm and dry No muscular weakness   Labs:   Lab Results  Component Value Date   WBC 18.2* 01/18/2015   HGB 11.8* 01/18/2015   HCT 37.1* 01/18/2015   MCV 96.6 01/18/2015   PLT 262 01/18/2015    Recent Labs Lab 01/18/15 0532  NA 137  K 4.2  CL 97*  CO2 34*  BUN 32*  CREATININE 1.61*  CALCIUM 8.6*  PROT 6.8  BILITOT 0.5  ALKPHOS 100  ALT 16*  AST 16  GLUCOSE 164*   Lab Results  Component Value Date   CKTOTAL 23 07/02/2007   CKMB 1.3 07/02/2007   TROPONINI 0.11* 01/18/2015    Lab Results  Component Value Date   CHOL  05/10/2007    135        ATP III CLASSIFICATION:  <200     mg/dL   Desirable  [id number]  mg/dL   Borderline High  >=098>=240    mg/dL   High   Lab Results  Component Value Date   HDL 32* 05/10/2007   Lab Results  Component Value Date   LDLCALC  05/10/2007    75        Total Cholesterol/HDL:CHD Risk Coronary Heart Disease Risk Table                     Men   Women  1/2 Average Risk   3.4   3.3   Lab Results  Component Value Date   TRIG 141 05/10/2007   Lab Results  Component Value Date   CHOLHDL 4.2 05/10/2007   No results found for: LDLDIRECT    Radiology: Ct Angio Chest Pe W/cm &/or Wo Cm  01/13/2015  CLINICAL DATA:  Hemoptysis and shortness of breath. EXAM: CT ANGIOGRAPHY CHEST WITH CONTRAST TECHNIQUE: Multidetector CT imaging of the chest was performed using the standard protocol during bolus administration of intravenous contrast. Multiplanar CT image  reconstructions and MIPs were obtained to evaluate the vascular anatomy. CONTRAST:  75mL OMNIPAQUE IOHEXOL 350 MG/ML SOLN COMPARISON:  CT angiogram dated 01/27/2007 and CT scan dated  07/01/2007 FINDINGS: There is a large pulmonary embolus in the left main pulmonary artery extending into the left lower lobe. There is focal atelectasis in slight consolidation in the left lower lobe peripherally. Again noted is chronic dilatation of the distal esophagus with food material in that area. 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 Has the patient had previous esophageal surgery? This does appear unchanged since 2009. There is chronic slight cardiomegaly.  RV/ LV ratio is normal. The right lung is clear. Pulmonary vascularity is not increased. Very tiny right effusion. No osseous abnormality. No significant abnormality in the upper abdomen. Review of the MIP images confirms the above findings. IMPRESSION: 1. Large pulmonary embolism to the left lower lobe. Atelectasis in the left lower lobe. 2. Chronic dilatation of the distal esophagus with what appears to be calcification in the wall of the esophagus, not significantly changed since 2009. Critical Value/emergent results were called by telephone at the time of interpretation on 01/13/2015 at 9:45 am to Casimiro Needle, RN , who verbally acknowledged these results. Electronically Signed   By: Francene Boyers M.D.   On: 01/13/2015 09:50   Dg Chest Port 1 View  01/17/2015  CLINICAL DATA:  Pneumonia. EXAM: PORTABLE CHEST 1 VIEW COMPARISON:  02-07-15. FINDINGS: Stable cardiomegaly is noted. No pneumothorax is noted. Central pulmonary vascular congestion is again noted. Mild right basilar opacity is noted concerning for edema or subsegmental atelectasis. Stable moderate left pleural effusion is noted with underlying atelectasis, edema or infiltrate. Bony thorax is unremarkable. IMPRESSION: Stable cardiomegaly and central pulmonary vascular congestion. Stable  moderate left pleural effusion with underlying atelectasis, infiltrate or edema. Electronically Signed   By: Lupita Raider, M.D.   On: 01/17/2015 12:55   Dg Chest Portable 1 View  02-07-2015  CLINICAL DATA:  75 year old male with shortness of breath and cough EXAM: PORTABLE CHEST 1 VIEW COMPARISON:  Chest radiograph dated 06/01/2012 FINDINGS: No significant change in the previously seen left mid and lower lung field opacity. There is silhouetting of the left cardiac border and left hemidiaphragm. Multiple findings may be related to prominent pericardial fat and hiatal hernia, a pleural effusion or pneumonia is not excluded. Right lung base subsegmental atelectatic changes noted. There is no pneumothorax. The cardiac borders are silhouetted. The osseous structures appear unremarkable. IMPRESSION: No significant change in left mid to lower lung field opacity. Electronically Signed   By: Elgie Collard M.D.   On: Feb 07, 2015 18:58    EKG: afib T wave inversion I,AVL chronic no acute changes   ASSESSMENT AND PLAN:   Elevated Troponin:  Related to PE no signs of acute ischemic coronary event.  Cath 2008 normal Afib:  Rate ok agree with stopping eliquis and changing to heparin while hemoptysis and bleeding looked into Anticoagulation: heparin consider LE duplex to document DVT and consider IVC filter if bleeding continues to be an issue Pulmonary:  Lung status appears to be very tenuous with baseline oxygen use, OSA, cor pulmone and now large PE.  Moving little Air and wheezing  Plan per Dr Juanetta Gosling on antibiotics to cover pulmonary infarct  Signed: Charlton Haws 01/18/2015, 11:57 AM

## 2015-01-18 NOTE — Progress Notes (Addendum)
[facility] PROGRESS NOTE  [name] [name] ZOX:[id number] DOB: 1940/03/11 DOA: 01-15-15 PCP: [name], MD   74 ? chronic kidney disease stage III Pulmonary hypertension + OHS S/OSA known chronic pleural effusion COPD,followed by Dr. Craige Cotta pulmonology-mixed obstructive/restrictive defect 05/2012 spirometry mild pulmonary hypertension by cath 2008 normal coronary arteries IDDM Body mass index is 37.56 kg/(m^2). presented to emergency room 12/31/16with hemoptysis CT chest =? PE? Mass felt to have large left pulmonary embolism  Also found on CT was questionable diverticulum   On admission  Found to have elevated troponin, Submassive PE  Converted to atrial fibrillation 01/17/15 a.m.    Assessment/Plan: coughing up blood -DDX ? venous pressure causing hemoptysis vs hemetemesis--Not frothy, no sputum-is frankly more sticky dark blood - gastroenterology  Dr. Karilyn Cota planning Endo 01/19/15 -Hb stable in the range of 12-13 -cxr 01/17/15 shows no perforation -can continue swish and swallow Magic mouthwash -transition to clear liquid diet for right now  Chr atrial fibrillation, [place] score greater than 3 Transition off of Elliquis given above discussion Echocardiogram shows EF65-70%,severe septal hypertrophy? pericardial effusion continue Cardizem 60 every 8-->240 qd  Large left known pulmonary embolus -Likely related to extremely sedentary state. -we'll hold Elliquis for now given undetermined source of bleeding -Start heparin per pharmacy which can be turned off easily  Insulin-dependent diabetes mellitus -Has had multiple hypoglycemic episodes coupled with decreased appetite. -Lantus has been decreased substantially from 75 units twice daily to 45 units once a day. -CBGs are now 164-173  Elevated troponin -Likely related to demand ischemia and presence of large PE, no further cardiac workup anticipated.  acute kidney injury Secondary to poor perfusion,  PE improvement from admission 44/1.7-->30/1.4  leukocytosis -? Stress de margination vs PNA-could also have possible lung infarct: Monitor trends No current fever App Pulmonary input Rpt CXR today Cipro 12/31-->1/4 Levaquin 1/4-- Also has thrush.    Hypokalemia Improved  Thrush -Started on diflucan. -Suspect this is causing his difficulty swallowing.  pulmonary disease-mixed obstructive, restrictive defect -might benefit from PFTs as an outpatient  pulmonary hypertension/OHSS -usually using BIpap12/8 and hasnt used since admit -will resume today and respiratory is aware -Probable substrate for A. Fib -Monitor on oxygen desats Green as an outpatient  Chronic pleural effusion -Currently stable at this time   Code Status: Full code Family Communication: Discussed with wife at bedside all questions answered  Disposition Plan: Likely home in 24 hours. Will request PT consultation   Consultants:  None   Antibiotics:  None   SUBJ Coughing up blood but less than before At basleine can sit up on the side of the bed and does need assistance at home Can get to wheelchair by himself "feel terrible"  "weak" No fever no chills just overall debilitated Hasn"t been using nightly bipap and hence poor overnight sleep  Objective: Filed Vitals:   01/17/15 1732 01/17/15 2241 01/18/15 0340 01/18/15 0948  BP: 128/60 122/68 140/45 136/57  Pulse: 89 70 98 105  Temp:  98.4 F (36.9 C) 98.9 F (37.2 C)   TempSrc:  Oral Oral   Resp: 20 20 20    Height:      Weight:      SpO2:  94% 92%     Intake/Output Summary (Last 24 hours) at 01/18/15 1045 Last data filed at 01/18/15 0830  Gross per 24 hour  Intake    360 ml  Output      0 ml  Net    360 ml  Filed Weights   01/13/15 0400 01/14/15 0648 01/16/15 0539  Weight: 110.5 kg (243 lb 9.7 oz) 112.22 kg (247 lb 6.4 oz) 112.038 kg (247 lb)    Exam:   General:  awake alert  Cardiovascular: irregularly  irregular  Respiratory: Clear to auscultation bilaterally  Abdomen: Soft, nontender, nondistended, positive bowel sounds  Extremities: trace lower extremity edema  Neurologic:  Nonfocal  Data Reviewed: Basic Metabolic Panel:  Recent Labs Lab 01-15-15 1924 01-15-2015 2313 01/13/15 0446 01/17/15 0536 01/18/15 0532  NA 139 137 135 139 137  K 3.2* 3.4* 3.1* 4.1 4.2  CL 94* 95* 95* 98* 97*  CO2 34* 27 31 35* 34*  GLUCOSE 325* 332* 314* 188* 164*  BUN 44* 49* 47* 34* 32*  CREATININE 1.79* 1.85* 1.67* 1.47* 1.61*  CALCIUM 9.0 9.0 8.5* 8.6* 8.6*   Liver Function Tests:  Recent Labs Lab January 15, 2015 1924 01/18/15 0532  AST 20 16  ALT 20 16*  ALKPHOS 95 100  BILITOT 0.7 0.5  PROT 7.2 6.8  ALBUMIN 3.2* 2.7*   No results for input(s): LIPASE, AMYLASE in the last 168 hours. No results for input(s): AMMONIA in the last 168 hours. CBC:  Recent Labs Lab Jan 15, 2015 1924  01/14/15 0558 01/15/15 0540 01/16/15 0650 01/17/15 0536 01/18/15 0532  WBC 18.4*  < > 15.4* 17.9* 16.2* 18.2* 18.2*  NEUTROABS 14.3*  --   --   --   --   --  12.7*  HGB 16.3  < > 14.6 13.2 13.2 12.2* 11.8*  HCT 49.6  < > 43.6 41.5 41.1 39.2 37.1*  MCV 94.1  < > 94.2 95.4 96.0 96.1 96.6  PLT 262  < > 216 243 232 248 262  < > = values in this interval not displayed. Cardiac Enzymes:  Recent Labs Lab 01/13/15 1022 01/13/15 1708 01/17/15 1230 01/17/15 1827 01/18/15 0009  TROPONINI 0.25* 0.22* 0.10* 0.09* 0.11*   BNP (last 3 results)  Recent Labs  01-15-15 1924  BNP 154.0*    ProBNP (last 3 results) No results for input(s): PROBNP in the last 8760 hours.  CBG:  Recent Labs Lab 01/17/15 1223 01/17/15 1411 01/17/15 1601 01/17/15 2113 01/18/15 0816  GLUCAP 256* 277* 243* 206* 173*    Recent Results (from the past 240 hour(s))  Culture, Urine     Status: None   Collection Time: 15-Jan-2015 11:50 PM  Result Value Ref Range Status   Specimen Description URINE, CLEAN CATCH  Final   Special  Requests NONE  Final   Culture   Final    MULTIPLE SPECIES PRESENT, SUGGEST RECOLLECTION Performed at [facility]    Report Status 01/15/2015 FINAL  Final  MRSA PCR Screening     Status: None   Collection Time: 01/13/15  1:09 AM  Result Value Ref Range Status   MRSA by PCR NEGATIVE NEGATIVE Final    Comment:        The GeneXpert MRSA Assay (FDA approved for NASAL specimens only), is one component of a comprehensive MRSA colonization surveillance program. It is not intended to diagnose MRSA infection nor to guide or monitor treatment for MRSA infections.      Studies: Dg Chest Port 1 View  01/17/2015  CLINICAL DATA:  Pneumonia. EXAM: PORTABLE CHEST 1 VIEW COMPARISON:  January 15, 2015. FINDINGS: Stable cardiomegaly is noted. No pneumothorax is noted. Central pulmonary vascular congestion is again noted. Mild right basilar opacity is noted concerning for edema or subsegmental atelectasis. Stable moderate left pleural  effusion is noted with underlying atelectasis, edema or infiltrate. Bony thorax is unremarkable. IMPRESSION: Stable cardiomegaly and central pulmonary vascular congestion. Stable moderate left pleural effusion with underlying atelectasis, infiltrate or edema. Electronically Signed   By: Lupita RaiderJames  Green Jr, M.D.   On: 01/17/2015 12:55    Scheduled Meds: . allopurinol  300 mg Oral Daily  . diltiazem  240 mg Oral Daily  . docusate sodium  100 mg Oral Daily  . finasteride  5 mg Oral Daily  . fluconazole  100 mg Oral Daily  . insulin aspart  0-15 Units Subcutaneous TID WC  . insulin aspart  0-5 Units Subcutaneous QHS  . insulin aspart  4 Units Subcutaneous TID WC  . insulin glargine  45 Units Subcutaneous QHS  . levofloxacin (LEVAQUIN) IV  500 mg Intravenous Q24H  . lidocaine  5 mL Mouth/Throat TID   And  . magic mouthwash  5 mL Oral TID  . pantoprazole (PROTONIX) IV  40 mg Intravenous Q24H  . potassium chloride  20 mEq Oral Daily   Continuous Infusions: .  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 Greater than 50% of this time was spent in direct contact with the patient coordinating care.   Pleas KochJai Mckenzie Bove, MD [facility] [phone]) [phone]

## 2015-01-18 NOTE — Progress Notes (Signed)
  Subjective:  Patient states he does not feel well. He complains of shortness of breath. He continues to complain of dysphagia. He only took in few bites of Jell-O. He starts to cough any drinks water. He denies abdominal pain. He is still coughing blood-tinged sputum. His wife states that last time he swallowed without any difficulty was one week when he had egg sandwich.  Objective: Blood pressure 108/59, pulse 65, temperature 98.9 F (37.2 C), temperature source Oral, resp. rate 20, height 5\' 8"  (1.727 m), weight 247 lb (112.038 kg), SpO2 94 %. Patient is alert and responds appropriately to questions. Abdomen is protuberant but soft and nontender without organomegaly or masses.  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 Recent worsening of dysphagia could be secondary to reflux esophagitis pill esophagitis or results of food filled diverticulum. Patient does not appear to be stable from all restent point to undergo esophagogastroduodenoscopy. If and when this procedures performed on afraid it will have to be under monitored anesthesia care. I doubt that he will be stable enough to undergo EGD tomorrow. Reevaluate patient tomorrow morning.

## 2015-01-19 ENCOUNTER — Encounter (HOSPITAL_COMMUNITY): Payer: Self-pay | Admitting: Student

## 2015-01-19 ENCOUNTER — Ambulatory Visit (HOSPITAL_COMMUNITY): Payer: Medicare Other | Attending: Physician Assistant

## 2015-01-19 DIAGNOSIS — I2699 Other pulmonary embolism without acute cor pulmonale: Secondary | ICD-10-CM | POA: Diagnosis not present

## 2015-01-19 DIAGNOSIS — J449 Chronic obstructive pulmonary disease, unspecified: Secondary | ICD-10-CM

## 2015-01-19 DIAGNOSIS — M7989 Other specified soft tissue disorders: Secondary | ICD-10-CM | POA: Diagnosis not present

## 2015-01-19 DIAGNOSIS — J189 Pneumonia, unspecified organism: Secondary | ICD-10-CM

## 2015-01-19 DIAGNOSIS — M79604 Pain in right leg: Secondary | ICD-10-CM | POA: Insufficient documentation

## 2015-01-19 DIAGNOSIS — E662 Morbid (severe) obesity with alveolar hypoventilation: Secondary | ICD-10-CM

## 2015-01-19 DIAGNOSIS — M79605 Pain in left leg: Secondary | ICD-10-CM | POA: Insufficient documentation

## 2015-01-19 DIAGNOSIS — J9612 Chronic respiratory failure with hypercapnia: Secondary | ICD-10-CM

## 2015-01-19 LAB — GLUCOSE, CAPILLARY
GLUCOSE-CAPILLARY: 225 mg/dL — AB (ref 65–99)
GLUCOSE-CAPILLARY: 321 mg/dL — AB (ref 65–99)
GLUCOSE-CAPILLARY: 200 mg/dL — AB (ref 65–99)
GLUCOSE-CAPILLARY: 194 mg/dL — AB (ref 65–99)

## 2015-01-19 MED ORDER — ACETYLCYSTEINE 20 % IN SOLN: 3.0000 mL | Freq: Four times a day (QID) | RESPIRATORY_TRACT | Status: DC

## 2015-01-19 MED ORDER — ACETYLCYSTEINE 20 % IN SOLN
3.0000 mL | Freq: Four times a day (QID) | RESPIRATORY_TRACT | Status: DC
  Administered 2015-01-19 – 2015-01-20 (×4): 3 mL via RESPIRATORY_TRACT
  Filled 2015-01-19 (×6): qty 4

## 2015-01-19 MED ORDER — CHLORHEXIDINE GLUCONATE 0.12 % MT SOLN
15.0000 mL | Freq: Two times a day (BID) | OROMUCOSAL | Status: DC
  Administered 2015-01-19 – 2015-01-25 (×12): 15 mL via OROMUCOSAL
  Filled 2015-01-19 (×11): qty 15

## 2015-01-19 MED ORDER — INSULIN GLARGINE 100 UNIT/ML ~~LOC~~ SOLN
30.0000 [IU] | Freq: Two times a day (BID) | SUBCUTANEOUS | Status: DC
  Administered 2015-01-19 – 2015-01-23 (×9): 30 [IU] via SUBCUTANEOUS
  Filled 2015-01-19 (×12): qty 0.3

## 2015-01-19 MED ORDER — CETYLPYRIDINIUM CHLORIDE 0.05 % MT LIQD
7.0000 mL | Freq: Two times a day (BID) | OROMUCOSAL | Status: DC
  Administered 2015-01-19 – 2015-01-25 (×13): 7 mL via OROMUCOSAL

## 2015-01-19 MED ORDER — ALBUTEROL SULFATE (2.5 MG/3ML) 0.083% IN NEBU
3.0000 mL | INHALATION_SOLUTION | Freq: Four times a day (QID) | RESPIRATORY_TRACT | Status: DC
  Administered 2015-01-19 – 2015-01-25 (×23): 3 mL via RESPIRATORY_TRACT
  Filled 2015-01-19 (×24): qty 3

## 2015-01-19 MED ORDER — ACETYLCYSTEINE 10 % IN SOLN: 4.0000 mL | Freq: Four times a day (QID) | RESPIRATORY_TRACT | Status: DC

## 2015-01-19 NOTE — Progress Notes (Signed)
Chest vest/CPT not started at this time as pt is currently coughing up hemoptysis again. HHn given but vest on hold until hemoptysis stops

## 2015-01-19 NOTE — Progress Notes (Signed)
Rehab Admissions Coordinator Note:  Patient was screened by Clois DupesBoyette, Mitra Duling Godwin for appropriateness for an Inpatient Acute Rehab Consult per PT recommendation. I would recommend   monitoring pt's level of participation with therapy over the next few days before determining rehab venue. He has not demonstrated the ability to do intense inpt rehab at this time. I will follow. Please call me with any questions. [phone]  Clois DupesBoyette, Paulette Lynch Godwin 01/19/2015, 4:59 PM  I can be reached at [phone].

## 2015-01-19 NOTE — Care Management Note (Signed)
Case Management Note Previous CM note initiated by Kathyrn SheriffJessica Childress RN, CM  Patient Details  Name: [name] GrateWilliam H [name] MRN: [id number] Date of Birth: 1940/06/10  Subjective/Objective:                  Admitted for PE. Pt is from home, lives with his wife and requires assistance with some ADL's. Pt uses a walker and wheelchair as needed. PT has home O2 but no neb machine. Pt has no hospital bed and is not interested in one. [facility] Pt plans to return home with resumption of HH services through [facility]. Pt has started on Eliquis, voucher already given to pt's family member.   Action/Plan: [facility] made aware of referral and will obtain pt info from chart. Will update on DC when appropriate.   Expected Discharge Date:                Expected Discharge Plan:  Home w Home Health Services  In-House Referral:  NA  Discharge planning Services  CM Consult  Post Acute Care Choice:  Home Health Choice offered to:  Patient  DME Arranged:    DME Agency:     HH Arranged:  RN, PT HH Agency:  [facility]  Status of Service:  In process, will continue to follow  Medicare Important Message Given:    Date Medicare IM Given:    Medicare IM give by:    Date Additional Medicare IM Given:    Additional Medicare Important Message give by:     If discussed at Long Length of Stay Meetings, dates discussed:    Additional Comments:  01/19/15- Donn PieriniKristi Shian Goodnow RN, BSN- pt tx from [facility] on 01/18/15- now on BIPAP- pulm. Consulted and following- on IV heparin, noted that pt was active with [facility]- CM to follow for d/c needs.   [name] [name], [name] Vanriper Hall, RN 01/19/2015, 10:46 AM

## 2015-01-19 NOTE — Progress Notes (Signed)
   Inpatient Diabetes Program Recommendations  AACE/ADA: New Consensus Statement on Inpatient Glycemic Control (2015)  Target Ranges:  Prepandial:   less than 140 mg/dL      Peak postprandial:   less than 180 mg/dL (1-2 hours)      Critically ill patients:  140 - 180 mg/dL   Review of Glycemic Control:  Results for [name] [name], [name] [name] (MRN [id number]) as of 01/19/2015 10:08  Ref. Range 01/18/2015 08:16 01/18/2015 11:16 01/18/2015 15:42 01/18/2015 17:09 01/18/2015 20:44 01/19/2015 07:38  Glucose-Capillary Latest Ref Range: 65-99 mg/dL 409173 ([name]) 811197 ([name]) 914240 ([name]) 214 ([name]) 239 ([name]) 225 ([name])    Diabetes history: Type 2 diabetes Outpatient Diabetes medications: Lantus 75 units bid, Novolog 60 units tid with meals Current orders for Inpatient glycemic control:  Lantus 45 units q HS, Novolog moderate tid with meals and HS, Novolog 4 units tid with meals  Inpatient Diabetes Program Recommendations:    Note CBG's continue to be elevated. Current meal intake according to documentation is less than 50%.   Please consider increasing Lantus to 30 units bid.   Thanks, Beryl MeagerJenny Graylin Sperling, RN, BC-ADM Inpatient Diabetes Coordinator Pager [phone] (8a-5p)

## 2015-01-19 NOTE — Care Management Important Message (Signed)
Important Message  Patient Details  Name: [name] [name] MRN: [id number] Date of Birth: 27-Jul-1940   Medicare Important Message Given:  Yes    Kyla BalzarineShealy, Estevon Fluke Abena 01/19/2015, 11:55 AM

## 2015-01-19 NOTE — Progress Notes (Signed)
PT Cancellation Note  Patient Details Name: [name] [name] MRN: [id number] DOB: 1940-12-27   Cancelled Treatment:    Reason Eval/Treat Not Completed: Patient at procedure or test/unavailable. Will re-attempt later.   Alain Deschene 01/19/2015, 11:21 AM Skip Mayerary Maribella Kuna PT [phone]

## 2015-01-19 NOTE — Progress Notes (Signed)
Utilization review completed.  

## 2015-01-19 NOTE — Progress Notes (Signed)
Name: [name] [name] MRN: [id number] DOB: October 28, 1940    ADMISSION DATE:  12/22/2014 CONSULTATION DATE:  1/5  REFERRING MD :  Dr. Mahala Menghini  CHIEF COMPLAINT:  Effusion  SUBJECTIVE:  Resting on bipap.  Reports he coughed up dark, thick blood last pm.  No other episodes of sputum production / hemoptysis.    VITAL SIGNS: Temp:  [98.2 F (36.8 C)-99.5 F (37.5 C)] 98.2 F (36.8 C) (01/06 0739) Pulse Rate:  [57-105] 83 (01/06 0739) Resp:  [26-36] 29 (01/06 0739) BP: (101-136)/(53-74) 117/74 mmHg (01/06 0739) SpO2:  [91 %-99 %] 99 % (01/06 0739) Weight:  [243 lb 3.2 oz (110.315 kg)-245 lb 14.4 oz (111.54 kg)] 243 lb 3.2 oz (110.315 kg) (01/06 7829)  PHYSICAL EXAMINATION: General: Elderly male in NAD on bipap. Neuro: awake, alert, communicates appropriately HEENT: [place]/AT. PERRL, sclerae anicteric. Cardiovascular: RRR, no M/R/G.  Lungs: Respirations even and unlabored.  BS diminished bilaterally L > R. No W/R/R. Abdomen: BS x 4, soft, NT/ND.  Musculoskeletal: No gross deformities, no edema.  Skin: Intact, warm, no rashes or lesions.     Recent Labs Lab 01/13/15 0446 01/17/15 0536 01/18/15 0532  NA 135 139 137  K 3.1* 4.1 4.2  CL 95* 98* 97*  CO2 31 35* 34*  BUN 47* 34* 32*  CREATININE 1.67* 1.47* 1.61*  GLUCOSE 314* 188* 164*    Recent Labs Lab 01/16/15 0650 01/17/15 0536 01/18/15 0532  HGB 13.2 12.2* 11.8*  HCT 41.1 39.2 37.1*  WBC 16.2* 18.2* 18.2*  PLT 232 248 262   Dg Chest Port 1 View  01/18/2015  CLINICAL DATA:  Acute worsening of shortness of breath this afternoon. Followup left pleural effusion and left lower lobe atelectasis versus pneumonia. Acute left lower lobe pulmonary embolus 01/13/2015. EXAM: PORTABLE CHEST 1 VIEW COMPARISON:  01/17/2015 and earlier, including CTA chest 01/13/2015. FINDINGS: Interval near complete opacification of the left hemithorax since yesterday, with only minimal aerated lung the left apex. Cardiac silhouette enlarged, though  obscured by the opacified left hemithorax. Increased pulmonary vascular congestion without overt edema. Stable mild atelectasis at the right lung base. No visible right pleural effusion. IMPRESSION: 1. Near complete opacification of the left hemithorax with consolidation now present throughout most of the left lung. As there is no significant mediastinal shift, this is likely a combination of worsening atelectasis and worsening pneumonia. 2. Stable mild right basilar atelectasis. 3. Worsening pulmonary vascular congestion without overt edema. These results will be called to the ordering clinician or representative by the Radiologist Assistant, and communication documented in the PACS or zVision Dashboard. Electronically Signed   By: Hulan Saas M.D.   On: 01/18/2015 17:32   Dg Chest Port 1 View  01/17/2015  CLINICAL DATA:  Pneumonia. EXAM: PORTABLE CHEST 1 VIEW COMPARISON:  January 12, 2015. FINDINGS: Stable cardiomegaly is noted. No pneumothorax is noted. Central pulmonary vascular congestion is again noted. Mild right basilar opacity is noted concerning for edema or subsegmental atelectasis. Stable moderate left pleural effusion is noted with underlying atelectasis, edema or infiltrate. Bony thorax is unremarkable. IMPRESSION: Stable cardiomegaly and central pulmonary vascular congestion. Stable moderate left pleural effusion with underlying atelectasis, infiltrate or edema. Electronically Signed   By: Lupita Raider, M.D.   On: 01/17/2015 12:55    SIGNIFICANT EVENTS  12/31  Admit to [facility] 01/05  Transfer [facility].  STUDIES:  Echo 03/08/07 >> EF 55%, mild diastolic dysfx, mild/mod LA/RA dilation, mild MR, mild TR, PAS 25 mmHg Spirometry 06/01/12 >>  FEV1 1.52 (58%), FEV1% 70; mixed obstructive/restrictive defect BiPAP 09/28/13 to 10/27/13 >> used on 30 of 30 nights with average 14 hrs and 29 min. Average AHI is 1.7 with BiPAP 12/8 cm H2O. CTA Chest 12/31>> large pulmonary embolism in the left  main pulmonary artery extending into the left lower lobe. Atelectasis in the left lower lobe. Chronic dilation of distal esophagus. ECHO 1/4 >>  Moderate LVH, EF 65- 70%, no RWMA's, possible anterior pericardial effusion vs tissue density. LE Doppler 1/6 >>   ABX:  Levaquin 1/4 >>   CULTURES:  UC 12/30 >>     Discussion: 75 year old male with PMH of chronic L plueral effusion deemed not a surgical candidate who [facility] with hemoptysis. Found to have large left pulmonary embolism. 1/5 had increased work of breathing and increased oxygen demands. [facility]  ASSESSMENT / PLAN:  Left hemithorax opacification on CXR - has chornic L effusion which is known to be complicated and he has been deemed a not a surgical candidate in regards to this effusion in the past. Worse on CXR 1/5 with extension to the entire L hemithorax.  Questionable increased effusion vs atelectasis vs mucous plug; favor atelectasis.  Plan: - Reassess pleural space with US am 1/6 - If significant effusion present, will need to hold heparin prior to thoracentesis.  Acute on chronic respiratory failure secondary to pulmonary embolism on chronic OHS, OSA, and COPD  Plan: -Supplemental O2 to keep SpO2 88-92% -BiPAP QHS -PRN albuterol -Pulmonary hygiene - IS, flutter valve -Add vibra vest -inhaled mucomyst x 6 doses  -mobilize  Pulmonary embolism with suspected lung infarction Hemoptysis  Plan: - Eliquis changed to heparin 1/5, continue heparin per pharmacy - No role systemic or catheter directed lysis. - Assess LE dopplers - Continue IV levaquin    Canary BrimBrandi Haddy Mullinax, NP-C [facility] Pgr: [phone] or if no answer [phone] 01/19/2015, 9:35 AM

## 2015-01-19 NOTE — Progress Notes (Signed)
eLink Physician-Brief Progress Note Patient Name: [name] GrateWilliam H [name] DOB: [birth year] MRN: [id number]   Date of Service  01/19/2015  HPI/Events of Note  Pt with hemoptysis.  As a result he was taken off BiPAP.  He then had oxygen desaturation.  He is still on heparin gtt.   eICU Interventions  Will have RN hold heparin gtt, and try to resume BiPAP as tolerated.  Pt has told bedside staff he would not want intubation.      Intervention Category Major Interventions: Other:  [name] [name] 01/19/2015, 10:02 PM

## 2015-01-19 NOTE — Progress Notes (Addendum)
Patient Demographics  [name] [name], is a 75 y.o. male, DOB - 03-26-1940, ZOX:[id number]  Admit date - 12/16/2014   Admitting Physician Haydee Monica, MD  Outpatient Primary MD for the patient is [name], MD  LOS - 6   Chief Complaint  Patient presents with  . Shortness of Breath  . Coughing up blood        Admission HPI/Brief narrative: 75 year old male with PMHof  diabetes, GERD, OSA/OHS on nocturnal BiPAP, COPD , atrial fibrillation, chronic left pleural effusion, and hypertension presents to any pain with complaints of dyspnea and hemoptysis, CTA chest was significant for pulmonary embolism, transitioned to Eliiquis, developed hematemesis, currently on heparin GTT, a shunt with worsening respiratory failure, with increased oxygen demand, requiring BiPAP, as well Hospital course: Dictated by dysphagia, unstable for EGD, patient was transferred to [facility] cone 01/19/2015 for possible thoracentesis, seen by pulmonary who thought his pleural effusion at baseline, and his worsening respiratory failure more likely related to collect as is/mucus plugging.  Subjective:   [name] [name] today has, No headache, No chest pain, No abdominal pain - No Nausea, still complaining soft dyspnea.  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 - currently requiring BiPAP,continue with BiPAP  daily at bedtime, wean as tolerated. - as well worsened secondary toucus plugging, atelectasis.  Pulmonary embolism with suspected lung infarction/hemoptysis - Initially on intercourse, currently changed to heparin GTT - not a candidate for systemic or catheter - lower extremity Doppler with no evidence of DVT - ontinue with IV levofloxacin  Left hemithorax opacification on chest x-ray - Patient with known chronic left pleural effusion has been seen by Dr. Craige Cotta as outpatient. - Within evidence of worsening left hemithorax, this is most likely related to necrosis versus mucus plugging. - Continue with pulmonary toilet, chest PT, inhaled Mucomyst and ambulate  Chronic atrial fibrillation - CHA2DS2-VASc greater than 3, initially on requests, currently on heparin GTT - heart rate controlled on diltiazem  Dysphagia - Patient with known esophageal motility disorder,and large epiphrenic diverticulum, this could be secondary to reflux esophagitis versus foof filled diverticulum - seen by GI dr. Kelvin Cellar at [facility]. - unstable for EGD  Elevated troponin - secondary to PE  Leukocytosis - Stress related secondary to PE and pulmonary infarction, continue with levofloxacin, remains afebrile  Diabetes mellitus - Continue with insulin sliding scale, CBGs uncontrolled, will increase Lantus from 45 units daily to 45units twice a day  Oral thrush - continue with Diflucan  Renal insufficiency - recheck BMP in a.m.  Code Status: full  Family Communication: wife at bedside  Disposition Plan: Pending further work up.   Procedures  none   Consults   Gastroenterology- Dr. Karilyn Cota at [facility]  Medications  Scheduled Meds: . acetylcysteine  3 mL Nebulization 4 times per day  . albuterol  3 mL Inhalation Q6H  . allopurinol  300 mg Oral Daily  . antiseptic oral rinse  7 mL Mouth Rinse q12n4p  . chlorhexidine  15 mL Mouth Rinse BID  . diltiazem  240 mg Oral Daily    . docusate sodium  100 mg Oral Daily  . finasteride  5 mg Oral Daily  . fluconazole  100 mg Oral Daily  . furosemide  40 mg Intravenous Q12H  . insulin aspart  0-15 Units Subcutaneous TID WC  . insulin aspart  0-5 Units Subcutaneous QHS  . insulin aspart  4 Units Subcutaneous TID WC  . insulin glargine  45 Units Subcutaneous QHS  . levofloxacin (LEVAQUIN) IV  500 mg Intravenous Q24H  . lidocaine  5 mL Mouth/Throat TID   And  . magic mouthwash  5 mL Oral TID  . pantoprazole (PROTONIX) IV  40 mg Intravenous Q24H  . potassium chloride  20 mEq Oral Daily   Continuous Infusions: . heparin 1,350 Units/hr (01/19/15 0400)   PRN Meds:.acetaminophen, dextromethorphan-guaiFENesin, magic mouthwash, ondansetron **OR** ondansetron (ZOFRAN) IV  DVT Prophylaxis  Heparin GTT  Lab Results  Component Value Date   PLT 262 01/18/2015    Antibiotics   Anti-infectives    Start     Dose/Rate Route Frequency Ordered Stop   01/17/15 0915  levofloxacin (LEVAQUIN) IVPB 500 mg     500 mg 100 mL/hr over 60 Minutes Intravenous Every 24 hours 01/17/15 0906     01/15/15 1100  fluconazole (DIFLUCAN) tablet 100 mg     100 mg Oral Daily 01/15/15 1057 01/22/15 0959   01/13/15 0930  ciprofloxacin (CIPRO) IVPB 400 mg  Status:  Discontinued     400 mg 200 mL/hr over 60 Minutes Intravenous Every 12 hours 01/13/15 0841 01/17/15 0906          Objective:   Filed Vitals:   01/19/15 0623 01/19/15 0739 01/19/15 0955 01/19/15 1222  BP:  117/74 109/81 90/75  Pulse:  83  94  Temp:  98.2 F (36.8 C)  98.7 F (37.1 C)  TempSrc:  Axillary  Axillary  Resp:  29  25  Height:      Weight: 110.315 kg (243 lb 3.2 oz)     SpO2:  99%  99%    Wt Readings from Last 3 Encounters:  01/19/15 110.315 kg (243 lb 3.2 oz)  12/02/13 129.729 kg (286 lb)  10/21/12 133.358 kg (294 lb)     Intake/Output Summary (Last 24 hours) at 01/19/15 1248 Last data filed at 01/18/15 2016  Gross per 24 hour  Intake      0 ml   Output    450 ml  Net   -450 ml     Physical Exam  Awake Alert, Oriented X 3, n BiPAP [employer].AT,PERRAL Supple Neck,No JVD, No cervical lymphadenopathy appriciated.  Symmetrical Chest wall movement,decreased air entry in the left,no wheezing RRR,No Gallops,Rubs or new Murmurs, No Parasternal Heave +ve B.Sounds, Abd Soft, No tenderness, No organomegaly appriciated, No rebound - guarding or rigidity. No Cyanosis, Clubbing or edema, No new Rash or bruise     Data Review   Micro Results Recent Results (from the past 240 hour(s))  Culture, Urine     Status: None   Collection Time: 12/30/2014 11:50 PM  Result Value Ref Range Status   Specimen Description URINE, CLEAN CATCH  Final   Special Requests  NONE  Final   Culture   Final    MULTIPLE SPECIES PRESENT, SUGGEST RECOLLECTION Performed at [facility]    Report Status 01/15/2015 FINAL  Final  MRSA PCR Screening     Status: None   Collection Time: 01/13/15  1:09 AM  Result Value Ref Range Status   MRSA by PCR NEGATIVE NEGATIVE Final    Comment:        The GeneXpert MRSA Assay (FDA approved for NASAL specimens only), is one component of a comprehensive MRSA colonization surveillance program. It is not intended to diagnose MRSA infection nor to guide or monitor treatment for MRSA infections.     Radiology Reports Ct Angio Chest Pe W/cm &/or Wo Cm  01/13/2015  CLINICAL DATA:  Hemoptysis and shortness of breath. EXAM: CT ANGIOGRAPHY CHEST WITH CONTRAST TECHNIQUE: Multidetector CT imaging of the chest was performed using the standard protocol during bolus administration of intravenous contrast. Multiplanar CT image reconstructions and MIPs were obtained to evaluate the vascular anatomy. CONTRAST:  75mL OMNIPAQUE IOHEXOL 350 MG/ML SOLN COMPARISON:  CT angiogram dated 01/27/2007 and CT scan dated 07/01/2007 FINDINGS: There is a large pulmonary embolus in the left main pulmonary artery extending into the left lower lobe. There  is focal atelectasis in slight consolidation in the left lower lobe peripherally. Again noted is chronic dilatation of the distal esophagus with food material in that area. 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 Has the patient had previous esophageal surgery? This does appear unchanged since 2009. There is chronic slight cardiomegaly.  RV/ LV ratio is normal. The right lung is clear. Pulmonary vascularity is not increased. Very tiny right effusion. No osseous abnormality. No significant abnormality in the upper abdomen. Review of the MIP images confirms the above findings. IMPRESSION: 1. Large pulmonary embolism to the left lower lobe. Atelectasis in the left lower lobe. 2. Chronic dilatation of the distal esophagus with what appears to be calcification in the wall of the esophagus, not significantly changed since 2009. Critical Value/emergent results were called by telephone at the time of interpretation on 01/13/2015 at 9:45 am to Casimiro Needle, RN , who verbally acknowledged these results. Electronically Signed   By: Francene Boyers M.D.   On: 01/13/2015 09:50   Dg Chest Port 1 View  01/18/2015  CLINICAL DATA:  Acute worsening of shortness of breath this afternoon. Followup left pleural effusion and left lower lobe atelectasis versus pneumonia. Acute left lower lobe pulmonary embolus 01/13/2015. EXAM: PORTABLE CHEST 1 VIEW COMPARISON:  01/17/2015 and earlier, including CTA chest 01/13/2015. FINDINGS: Interval near complete opacification of the left hemithorax since yesterday, with only minimal aerated lung the left apex. Cardiac silhouette enlarged, though obscured by the opacified left hemithorax. Increased pulmonary vascular congestion without overt edema. Stable mild atelectasis at the right lung base. No visible right pleural effusion. IMPRESSION: 1. Near complete opacification of the left hemithorax with consolidation now present throughout most of the left lung. As there is no significant  mediastinal shift, this is likely a combination of worsening atelectasis and worsening pneumonia. 2. Stable mild right basilar atelectasis. 3. Worsening pulmonary vascular congestion without overt edema. These results will be called to the ordering clinician or representative by the Radiologist Assistant, and communication documented in the PACS or zVision Dashboard. Electronically Signed   By: Hulan Saas M.D.   On: 01/18/2015 17:32   Dg Chest Port 1 View  01/17/2015  CLINICAL DATA:  Pneumonia. EXAM: PORTABLE CHEST 1 VIEW COMPARISON:  January 12, 2015. FINDINGS: Stable cardiomegaly is noted. No pneumothorax is noted. Central pulmonary vascular congestion is again noted. Mild right basilar opacity is noted concerning for edema or subsegmental atelectasis. Stable moderate left pleural effusion is noted with underlying atelectasis, edema or infiltrate. Bony thorax is unremarkable. IMPRESSION: Stable cardiomegaly and central pulmonary vascular congestion. Stable moderate left pleural effusion with underlying atelectasis, infiltrate or edema. Electronically Signed   By: Lupita Raider, M.D.   On: 01/17/2015 12:55   Dg Chest Portable 1 View  01/10/2015  CLINICAL DATA:  75 year old male with shortness of breath and cough EXAM: PORTABLE CHEST 1 VIEW COMPARISON:  Chest radiograph dated 06/01/2012 FINDINGS: No significant change in the previously seen left mid and lower lung field opacity. There is silhouetting of the left cardiac border and left hemidiaphragm. Multiple findings may be related to prominent pericardial fat and hiatal hernia, a pleural effusion or pneumonia is not excluded. Right lung base subsegmental atelectatic changes noted. There is no pneumothorax. The cardiac borders are silhouetted. The osseous structures appear unremarkable. IMPRESSION: No significant change in left mid to lower lung field opacity. Electronically Signed   By: Elgie Collard M.D.   On: 12/22/2014 18:58      CBC  Recent Labs Lab 01/08/2015 1924  01/14/15 0558 01/15/15 0540 01/16/15 0650 01/17/15 0536 01/18/15 0532  WBC 18.4*  < > 15.4* 17.9* 16.2* 18.2* 18.2*  HGB 16.3  < > 14.6 13.2 13.2 12.2* 11.8*  HCT 49.6  < > 43.6 41.5 41.1 39.2 37.1*  PLT 262  < > 216 243 232 248 262  MCV 94.1  < > 94.2 95.4 96.0 96.1 96.6  MCH 30.9  < > 31.5 30.3 30.8 29.9 30.7  MCHC 32.9  < > 33.5 31.8 32.1 31.1 31.8  RDW 15.1  < > 15.2 15.1 15.2 15.2 15.4  LYMPHSABS 3.1  --   --   --   --   --  3.7  MONOABS 0.8  --   --   --   --   --  1.4*  EOSABS 0.2  --   --   --   --   --  0.4  BASOSABS 0.1  --   --   --   --   --  0.1  < > = values in this interval not displayed.  Chemistries   Recent Labs Lab 12/24/2014 1924 12/19/2014 2313 01/13/15 0446 01/17/15 0536 01/18/15 0532  NA 139 137 135 139 137  K 3.2* 3.4* 3.1* 4.1 4.2  CL 94* 95* 95* 98* 97*  CO2 34* 27 31 35* 34*  GLUCOSE 325* 332* 314* 188* 164*  BUN 44* 49* 47* 34* 32*  CREATININE 1.79* 1.85* 1.67* 1.47* 1.61*  CALCIUM 9.0 9.0 8.5* 8.6* 8.6*  AST 20  --   --   --  16  ALT 20  --   --   --  16*  ALKPHOS 95  --   --   --  100  BILITOT 0.7  --   --   --  0.5   ------------------------------------------------------------------------------------------------------------------ estimated creatinine clearance is 47.8 mL/min (by C-G formula based on Cr of 1.61). ------------------------------------------------------------------------------------------------------------------ No results for input(s): HGBA1C in the last 72 hours. ------------------------------------------------------------------------------------------------------------------ No results for input(s): CHOL, HDL, LDLCALC, TRIG, CHOLHDL, LDLDIRECT in the last 72 hours. ------------------------------------------------------------------------------------------------------------------ No results for input(s): TSH, T4TOTAL, T3FREE, THYROIDAB in the last 72 hours.  Invalid input(s):  FREET3 ------------------------------------------------------------------------------------------------------------------ No results for input(s): VITAMINB12, FOLATE, FERRITIN, TIBC,  IRON, RETICCTPCT in the last 72 hours.  Coagulation profile  Recent Labs Lab 01/13/15 1022 01/18/15 0532  INR 1.11 2.02*    No results for input(s): DDIMER in the last 72 hours.  Cardiac Enzymes  Recent Labs Lab 01/17/15 1230 01/17/15 1827 01/18/15 0009  TROPONINI 0.10* 0.09* 0.11*   ------------------------------------------------------------------------------------------------------------------ Invalid input(s): POCBNP     Time Spent in minutes   35 minutes   ELGERGAWY, DAWOOD M.D on 01/19/2015 at 12:48 PM  Between 7am to 7pm - Pager - [phone]  After 7pm go to [email or web address] - password [facility]  [facility]   Office  [phone]

## 2015-01-19 NOTE — Progress Notes (Signed)
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 Royann Shivers Patient Profile: 75 yo male w/ PMH of chronic atrial fibrillation, cor pulmonale, COPD, Type 2 DM, OSA, and non-obstructive CAD admitted on 01/01/2015 for hemoptysis. Found to have PE. Cards consulted on 01/18/2015 for anticoagulation in atrial fibrillation.   SUBJECTIVE: Reports some improvement in his breathing. Was on BiPAP overnight. Now on 4L [place] (on 3L at baseline). Denies any chest pain or palpitations.  OBJECTIVE Filed Vitals:   01/19/15 0034 01/19/15 0428 01/19/15 0623 01/19/15 0739  BP: 120/73 101/62  117/74  Pulse: 87 73  83  Temp: 98.5 F (36.9 C) 98.3 F (36.8 C)  98.2 F (36.8 C)  TempSrc: Axillary Axillary  Axillary  Resp: 27 26  29   Height:      Weight: 245 lb 14.4 oz (111.54 kg)  243 lb 3.2 oz (110.315 kg)   SpO2: 97% 99%  99%    Intake/Output Summary (Last 24 hours) at 01/19/15 0948 Last data filed at 01/18/15 2016  Gross per 24 hour  Intake    120 ml  Output    450 ml  Net   -330 ml   Filed Weights   01/16/15 0539 01/19/15 0034 01/19/15 0623  Weight: 247 lb (112.038 kg) 245 lb 14.4 oz (111.54 kg) 243 lb 3.2 oz (110.315 kg)    PHYSICAL EXAM General: Well developed, well nourished, male in no acute distress. Head: Normocephalic, atraumatic.  Neck: Supple without bruits, JVD not elevated. Lungs:  Resp regular and unlabored, Decreased breath sounds at bases. No audible wheezing. Heart: Irregularly irregular, S1, S2, no S3, S4, or murmur; no rub. Abdomen: Soft, non-tender, non-distended with  normoactive bowel sounds. No hepatomegaly. No rebound/guarding. No obvious abdominal masses. Extremities: No clubbing, cyanosis, or edema. Distal pedal pulses are 2+ bilaterally. Neuro: Alert and oriented X 3. Moves all extremities spontaneously. Psych: Normal affect.  LABS: CBC: Recent Labs  01/17/15 0536 01/18/15 0532  WBC 18.2* 18.2*  NEUTROABS  --  12.7*  HGB 12.2* 11.8*  HCT 39.2 37.1*  MCV 96.1 96.6  PLT 248 262   INR: Recent Labs  01/18/15 0532  INR 2.02*   Basic Metabolic Panel: Recent Labs  01/17/15 0536 01/18/15 0532  NA 139 137  K 4.1 4.2  CL 98* 97*  CO2 35* 34*  GLUCOSE 188* 164*  BUN 34* 32*  CREATININE 1.47* 1.61*  CALCIUM 8.6* 8.6*   Liver Function Tests: Recent Labs  01/18/15 0532  AST 16  ALT 16*  ALKPHOS 100  BILITOT 0.5  PROT 6.8  ALBUMIN 2.7*   Cardiac Enzymes: Recent Labs  01/17/15 1230 01/17/15 1827 01/18/15 0009  TROPONINI 0.10* 0.09* 0.11*   No results for input(s): TROPIPOC in the last 72 hours. BNP:  B NATRIURETIC PEPTIDE  Date/Time Value Ref Range Status  01/01/2015 07:24 PM 154.0* 0.0 - 100.0 pg/mL Final    TELE: Atrial fibrillation with rate in the 70's. No atopic events.        ECHO:01/17/2015  Study Conclusions - Left ventricle: The cavity size was normal. Wall thickness  was increased in a pattern of moderate LVH. There was severe focal basal hypertrophy of the septum. Systolic function was vigorous. The estimated ejection fraction was in the range of 65% to 70%. Wall motion was normal; there were no regional wall motion abnormalities. The study is not technically sufficient to allow evaluation of LV diastolic function. - Aortic valve: Trileaflet; mildly thickened leaflets. - Mitral valve: Calcified annulus. There was trivial regurgitation. - Left atrium: The atrium was moderately dilated. - Right atrium: Central venous pressure (est): 8 mm Hg. - Atrial septum: No defect or patent foramen  ovale was identified. - Tricuspid valve: There was trivial regurgitation. - Pulmonary arteries: Systolic pressure could not be accurately estimated. - Pericardium, extracardiac: A prominent pericardial fat pad was present, cannot exclude organized anterior pericardial effusion or tissue density.  Impressions: - Moderate LVH with severe basal septal hypertrophy and LVEF 65-70%. Indeterminate diastolic function in the setting of rapid atrial fibrillation/flutter. Moderate left atrial enlargement. MAC with trivial mitral regurgitation. Grossly normal RV contraction. Unable to assess PASP. A prominent pericardial fat pad was present, cannot exclude organized anterior pericardial effusion or tissue density.   Radiology/Studies: Dg Chest Port 1 View: 01/18/2015  CLINICAL DATA:  Acute worsening of shortness of breath this afternoon. Followup left pleural effusion and left lower lobe atelectasis versus pneumonia. Acute left lower lobe pulmonary embolus 01/13/2015. EXAM: PORTABLE CHEST 1 VIEW COMPARISON:  01/17/2015 and earlier, including CTA chest 01/13/2015. FINDINGS: Interval near complete opacification of the left hemithorax since yesterday, with only minimal aerated lung the left apex. Cardiac silhouette enlarged, though obscured by the opacified left hemithorax. Increased pulmonary vascular congestion without overt edema. Stable mild atelectasis at the right lung base. No visible right pleural effusion. IMPRESSION: 1. Near complete opacification of the left hemithorax with consolidation now present throughout most of the left lung. As there is no significant mediastinal shift, this is likely a combination of worsening atelectasis and worsening pneumonia. 2. Stable mild right basilar atelectasis. 3. Worsening pulmonary vascular congestion without overt edema. These results will be called to the ordering clinician or representative by the Radiologist Assistant, and communication  documented in the PACS or zVision Dashboard. Electronically Signed   By: Hulan Saashomas  Lawrence M.D.   On: 01/18/2015 17:32   Dg Chest Port 1 View: 01/17/2015  CLINICAL DATA:  Pneumonia. EXAM: PORTABLE CHEST 1 VIEW COMPARISON:  January 12, 2015. FINDINGS: Stable cardiomegaly is noted. No pneumothorax is noted. Central pulmonary vascular congestion is again noted. Mild right basilar opacity is noted concerning for edema or subsegmental atelectasis. Stable moderate left pleural effusion is noted with underlying atelectasis, edema or infiltrate. Bony thorax is unremarkable. IMPRESSION: Stable cardiomegaly and central pulmonary vascular congestion. Stable moderate left pleural effusion with underlying atelectasis, infiltrate or edema. Electronically Signed   By: Lupita RaiderJames  Green Jr, M.D.   On: 01/17/2015 12:55     Current Medications:  . acetylcysteine  4 mL Nebulization 4 times per day  . albuterol  3 mL Inhalation Q6H  . allopurinol  300 mg Oral Daily  . antiseptic oral rinse  7 mL Mouth Rinse q12n4p  . chlorhexidine  15 mL Mouth Rinse BID  . diltiazem  240 mg Oral Daily  . docusate sodium  100 mg Oral Daily  . finasteride  5 mg Oral Daily  . fluconazole  100 mg Oral Daily  . furosemide  40 mg Intravenous Q12H  . insulin aspart  0-15 Units Subcutaneous TID WC  . insulin aspart  0-5 Units  Subcutaneous QHS  . insulin aspart  4 Units Subcutaneous TID WC  . insulin glargine  45 Units Subcutaneous QHS  . levofloxacin (LEVAQUIN) IV  500 mg Intravenous Q24H  . lidocaine  5 mL Mouth/Throat TID   And  . magic mouthwash  5 mL Oral TID  . pantoprazole (PROTONIX) IV  40 mg Intravenous Q24H  . potassium chloride  20 mEq Oral Daily   . heparin 1,350 Units/hr (01/19/15 0400)    ASSESSMENT AND PLAN:  1. Elevated Troponin - peaked at 0.25 on 01/13/2015. Trending down to 0.11 on 01/18/2015. - thought to be secondary to PE - he remains without anginal symptoms at this time.  2. Chronic Atrial Fibrillation -  rate well-controlled in the 70's. Continue Diltiazem 240mg  daily  This patients CHA2DS2-VASc Score and unadjusted Ischemic Stroke Rate (% per year) is equal to 4.8 % stroke rate/year from a score of 4 (HTN, DM, Age (2)). Eliquis has been discontinued until Hemoptysis can be further evaluated. Currently on Heparin. Would not be a candidate for Watchman due to CHA2DS2-VASc greater than 3.  3. Pulmonary Embolism - heparin per pharmacy consult - LE Duplex pending to further evaluate for DVT  - per Pulmonary  4. Dysphagia - potential EGD once respiratory symptoms improve - per GI   Signed, Ellsworth Lennox , PA-C 9:48 AM 01/19/2015 Pager: [phone]   I have examined the patient and reviewed assessment and plan and discussed with patient.  Agree with above as stated.  Stable from a cardiac standpoint.  Family in the room states that he is generally immobile and that is his biggest RF for DVT.  COntinue anticoagulation, but now with hemoptysis, may need to consider IVC filter at some point.  Laurann Mcmorris S.

## 2015-01-19 NOTE — Evaluation (Signed)
Physical Therapy Evaluation Patient Details Name: BRIAN ZEITLIN MRN: [id number] DOB: 06-Feb-1940 Today's Date: 01/19/2015   History of Present Illness  Adm to [facility] 01/12/16 coughing up blood. Later transferred to [facility] for ?thoracentesis. +PE and put on heparin. PMHx- COPD, obstructive sleep apnea, chronic atrial fibrillation, cor pulmonale, and pulmonary hypertension.    Clinical Impression  Pt admitted with above diagnosis. Patient responded to "taking things one step at a time" participating with bed exercises and bed mobility. Will need 2 person assist to safely stand (and for pt to feel secure). Pt currently with functional limitations due to the deficits listed below (see PT Problem List).  Pt will benefit from skilled PT to increase their independence and safety with mobility to allow discharge to the venue listed below.       Follow Up Recommendations CIR    Equipment Recommendations   (TBD)    Recommendations for Other Services OT consult; Rehab consult     Precautions / Restrictions Precautions Precautions: Other (comment) (coughing up blood)      Mobility  Bed Mobility Overal bed mobility: Needs Assistance Bed Mobility: Rolling;Sidelying to Sit;Sit to Supine Rolling: Supervision Sidelying to sit: Min assist;HOB elevated   Sit to supine: Min guard   General bed mobility comments: uses rail, encouragement to roll, assist to raise torso; near assist to raise legs onto bed; max vc for pt to scoot to [facility] with HOB 0 and rails  Transfers                 General transfer comment: refused  Ambulation/Gait                Stairs            Wheelchair Mobility    Modified Rankin (Stroke Patients Only)       Balance Overall balance assessment: Needs assistance Sitting-balance support: No upper extremity supported;Feet unsupported Sitting balance-Leahy Scale: Fair                                       Pertinent  Vitals/Pain Pain Assessment: No/denies pain    Home Living Family/patient expects to be discharged to:: Private residence Living Arrangements: Spouse/significant other;Other relatives (grandson) Available Help at Discharge: Family;Available 24 hours/day Type of Home: House Home Access: Stairs to enter   [employer] of Steps: 1 Home Layout: One level Home Equipment: Wheelchair - manual;Hospital bed      Prior Function Level of Independence: Needs assistance   Gait / Transfers Assistance Needed: per wife, gets in/out of bed himself (hospital bed) and walks ~12 ft to bathroom (part of the way holds dresser, then doorframe). He has no interest in leaving bedroom to even go to kitchen or living room; grandson gets him from car up the 1 step via w/c           Hand Dominance   Dominant Hand: Right    Extremity/Trunk Assessment   Upper Extremity Assessment: Generalized weakness           Lower Extremity Assessment: Generalized weakness (bil hip/knee extension 3+ (tested in supine))      Cervical / Trunk Assessment: Kyphotic  Communication   Communication: HOH  Cognition Arousal/Alertness: Awake/alert Behavior During Therapy: Flat affect Overall Cognitive Status: Within Functional Limits for tasks assessed  General Comments General comments (skin integrity, edema, etc.): Wife present throughout. She does not want pt to need SNF and she does not think he will agree    Exercises General Exercises - Lower Extremity Ankle Circles/Pumps: AROM;Both;10 reps Long Arc Quad: AROM;Both;5 reps Heel Slides: AROM;Strengthening;Both;5 reps (resisted extension)      Assessment/Plan    PT Assessment Patient needs continued PT services  PT Diagnosis Generalized weakness   PT Problem List Decreased strength;Decreased activity tolerance;Decreased balance;Decreased mobility;Decreased knowledge of use of DME;Obesity  PT Treatment Interventions  DME instruction;Gait training;Functional mobility training;Therapeutic activities;Therapeutic exercise;Patient/family education   PT Goals (Current goals can be found in the Care Plan section) Acute Rehab PT Goals Patient Stated Goal: go home with able to walk to bathroom PT Goal Formulation: With patient/family Time For Goal Achievement: 02/02/15 Potential to Achieve Goals: Fair    Frequency Min 3X/week   Barriers to discharge        Co-evaluation               End of Session Equipment Utilized During Treatment: Oxygen Activity Tolerance: Patient limited by fatigue Patient left: in bed;with call bell/phone within reach;with bed alarm set;with nursing/sitter in room;with family/visitor present Nurse Communication: Mobility status         Time: [id number] PT Time Calculation (min) (ACUTE ONLY): 34 min   Charges:   PT Evaluation $PT Eval High Complexity: 1 Procedure PT Treatments $Therapeutic Exercise: 8-22 mins   PT G Codes:        Jalene Lacko 01/19/2015, 4:46 PM Pager [phone]

## 2015-01-19 NOTE — Progress Notes (Signed)
VASCULAR LAB PRELIMINARY  PRELIMINARY  PRELIMINARY  PRELIMINARY  Bilateral lower extremity venous duplex completed.    Preliminary report:  Bilateral:  No evidence of DVT, superficial thrombosis, or Baker's Cyst.   Lamees Gable, RVS 01/19/2015, 11:51 AM

## 2015-01-19 NOTE — Progress Notes (Signed)
ANTICOAGULATION CONSULT NOTE - follow up  Pharmacy Consult for heparin Indication: atrial fibrillation, PE  Allergies  Allergen Reactions  . Cefaclor     Reactions are unknown  . Cephalexin     Reactions are unknown  . Sulfamethoxazole-Trimethoprim     Reactions are unknown   Patient Measurements: Height: 5\' 8"  (172.7 cm) Weight: 243 lb 3.2 oz (110.315 kg) IBW/kg (Calculated) : 68.4 Heparin Dosing Weight: 92.8 kg  Vital Signs: Temp: 98.2 F (36.8 C) (01/06 0739) Temp Source: Axillary (01/06 0739) BP: 109/81 mmHg (01/06 0955) Pulse Rate: 83 (01/06 0739)  Labs:  Recent Labs  01/17/15 0536 01/17/15 1230 01/17/15 1827 01/17/15 2014 01/18/15 0009 01/18/15 0532 01/18/15 0539  HGB 12.2*  --   --   --   --  11.8*  --   HCT 39.2  --   --   --   --  37.1*  --   PLT 248  --   --   --   --  262  --   APTT  --  39*  --  81*  --   --  76*  LABPROT  --   --   --   --   --  22.7*  --   INR  --   --   --   --   --  2.02*  --   HEPARINUNFRC  --  2.01*  --  2.01*  --  >2.20*  --   CREATININE 1.47*  --   --   --   --  1.61*  --   TROPONINI  --  0.10* 0.09*  --  0.11*  --   --    Estimated Creatinine Clearance: 47.8 mL/min (by C-G formula based on Cr of 1.61).  Medical History: Past Medical History  Diagnosis Date  . Unspecified essential hypertension   . Type II or unspecified type diabetes mellitus without mention of complication, not stated as uncontrolled   . Esophageal reflux   . Gout   . Depressive disorder, not elsewhere classified   . OSA (obstructive sleep apnea)     BiPAP  . COPD (chronic obstructive pulmonary disease) (HCC)   . Respiratory failure (HCC)   . BPH (benign prostatic hyperplasia)   . Pleural effusion, left 06/14/2007        . Pulmonary hypertension (HCC)   . Cor pulmonale (HCC)     R heart failure  . Diverticulosis   . CAD (coronary artery disease)     non-obsctructive   . Obesity   . History of nuclear stress test 06/18/2006    [facility];  normal myoview with diminished oxygen sats   . CHF (congestive heart failure) (HCC)   . Chronic atrial fibrillation (HCC)    Medications:  . heparin 1,350 Units/hr (01/19/15 0400)    Assessment: 75 yo man started who was on Eliquis and now changed to heparin due to hemoptysis.  Currently using PTT to guide therapy given false elevation of heparin levels due to Eliquis.  Last Eliquis dose given 1/3.  PTT this AM at goal on heparin at 1350 units/hr.  Per MD note had one episode of hemoptysis last night, none since.  Hgb fairly stable, no other bleeding noted.  Goal of Therapy:  aPTT 66-102 seconds Monitor platelets by anticoagulation protocol: Yes  Heparin level 0.3-0.7 units/ml   Plan:  Continue heparin drip at 1350 units/hr  Daily PTT/heparin level. F/u plans to resume oral anticoagulation when able.  Thanks  for allowing pharmacy to be a part of this patient's care.  Tad Moore, BCPS  Clinical Pharmacist Pager [phone]  01/19/2015 10:46 AM

## 2015-01-20 ENCOUNTER — Inpatient Hospital Stay (HOSPITAL_COMMUNITY): Payer: Medicare Other

## 2015-01-20 LAB — CBC WITH DIFFERENTIAL/PLATELET

## 2015-01-20 LAB — APTT: APTT: 34 s (ref 24–37)

## 2015-01-20 LAB — CBC
HEMATOCRIT: 30.3 % — AB (ref 39.0–52.0)
HEMOGLOBIN: 9.3 g/dL — AB (ref 13.0–17.0)
MCH: 29.2 pg (ref 26.0–34.0)
MCHC: 30.7 g/dL (ref 30.0–36.0)
MCV: 95.3 fL (ref 78.0–100.0)
Platelets: 269 10*3/uL (ref 150–400)
RBC: 3.18 MIL/uL — AB (ref 4.22–5.81)
RDW: 15.2 % (ref 11.5–15.5)
WBC: 19.4 10*3/uL — AB (ref 4.0–10.5)

## 2015-01-20 LAB — GLUCOSE, CAPILLARY
GLUCOSE-CAPILLARY: 202 mg/dL — AB (ref 65–99)
GLUCOSE-CAPILLARY: 250 mg/dL — AB (ref 65–99)
GLUCOSE-CAPILLARY: 227 mg/dL — AB (ref 65–99)
GLUCOSE-CAPILLARY: 166 mg/dL — AB (ref 65–99)

## 2015-01-20 LAB — HEPARIN LEVEL (UNFRACTIONATED): HEPARIN UNFRACTIONATED: 1.5 [IU]/mL — AB (ref 0.30–0.70)

## 2015-01-20 MED ORDER — VANCOMYCIN HCL 10 G IV SOLR
2500.0000 mg | Freq: Once | INTRAVENOUS | Status: AC
  Administered 2015-01-20: 2500 mg via INTRAVENOUS
  Filled 2015-01-20: qty 2500

## 2015-01-20 MED ORDER — VANCOMYCIN HCL 10 G IV SOLR
1250.0000 mg | INTRAVENOUS | Status: DC
  Administered 2015-01-21 – 2015-01-22 (×2): 1250 mg via INTRAVENOUS
  Filled 2015-01-20 (×3): qty 1250

## 2015-01-20 MED ORDER — GUAIFENESIN ER 600 MG PO TB12
1200.0000 mg | ORAL_TABLET | Freq: Two times a day (BID) | ORAL | Status: DC
  Administered 2015-01-20 – 2015-01-24 (×9): 1200 mg via ORAL
  Filled 2015-01-20 (×9): qty 2

## 2015-01-20 MED ORDER — DEXTROSE 5 % IV SOLN
1.0000 g | Freq: Three times a day (TID) | INTRAVENOUS | Status: DC
  Administered 2015-01-20 – 2015-01-25 (×15): 1 g via INTRAVENOUS
  Filled 2015-01-20 (×17): qty 1

## 2015-01-20 MED ORDER — SODIUM CHLORIDE 3 % IN NEBU
5.0000 mL | INHALATION_SOLUTION | Freq: Two times a day (BID) | RESPIRATORY_TRACT | Status: AC
  Administered 2015-01-20 – 2015-01-22 (×6): 5 mL via RESPIRATORY_TRACT
  Filled 2015-01-20 (×9): qty 8

## 2015-01-20 NOTE — Progress Notes (Signed)
Pt c/o SOB, O2 sats in the low 90s, NAD noted.  RT notified and neb treatment given.  RT then placed pt on CPAP at his request.  Will continue to monitor.  [name] [name], [name] [name]

## 2015-01-20 NOTE — Progress Notes (Signed)
Rt spoke with Dr Kendrick FriesMcQuaid on phone regarding pt care. Pt to get hypertonic solution BID in HHN and CPT (vest) Q6. RT will continue to monitor

## 2015-01-20 NOTE — Progress Notes (Addendum)
Name: Kathlynn GrateWilliam H Gladwell MRN: [id number] DOB: 07/06/1940    ADMISSION DATE:  01/02/2015 CONSULTATION DATE:  1/5  REFERRING MD :  Dr. Mahala MenghiniSamtani  CHIEF COMPLAINT:  Effusion  SUBJECTIVE:  Significant hypoxemia overnight, hemoptysis, put on BIPAP  VITAL SIGNS: Temp:  [97.8 F (36.6 C)-98.7 F (37.1 C)] 97.8 F (36.6 C) (01/07 0409) Pulse Rate:  [41-110] 81 (01/07 0409) Resp:  [22-39] 30 (01/07 0409) BP: (90-133)/(62-94) 117/65 mmHg (01/07 1027) SpO2:  [60 %-99 %] 96 % (01/07 0858) Weight:  [110.179 kg (242 lb 14.4 oz)] 110.179 kg (242 lb 14.4 oz) (01/07 0409)  PHYSICAL EXAMINATION: General:frail, elderly male, mild tachypnea HEENT: NCAT, thick neck PULM: no breath sounds on left, CTA R CV: Irreg irreg, no mgr GI: BS+, soft, nontender MSK: normal bulk and tone Neuro: A&Ox4, conversant     Recent Labs Lab 01/17/15 0536 01/18/15 0532  NA 139 137  K 4.1 4.2  CL 98* 97*  CO2 35* 34*  BUN 34* 32*  CREATININE 1.47* 1.61*  GLUCOSE 188* 164*    Recent Labs Lab 01/18/15 0532 01/19/15 0500 01/20/15 0434  HGB 11.8* PATIENT DISCHARGED OR EXPIRED 9.3*  HCT 37.1* PATIENT DISCHARGED OR EXPIRED 30.3*  WBC 18.2* PATIENT DISCHARGED OR EXPIRED 19.4*  PLT 262 PATIENT DISCHARGED OR EXPIRED 269   Dg Chest Port 1 View  01/20/2015  CLINICAL DATA:  Patient with shortness of breath. EXAM: PORTABLE CHEST 1 VIEW COMPARISON:  Chest radiograph 01/18/2015. FINDINGS: Monitoring leads overlie the patient. There is complete opacification of the left hemi thorax with leftward mediastinal shift. There is abrupt cut off of the left mainstem bronchus. There is small right pleural effusion with bandlike opacification within the right lower hemi thorax. No pneumothorax. IMPRESSION: Complete opacification of left hemi thorax with leftward mediastinal shift and abrupt cut off of the left mainstem bronchus. Overall findings are worrisome for atelectatic change involving the left lung, potentially secondary  to endobronchial lesion or mucous plugging within left mainstem bronchus. Superimposed infectious process within left lung is an additional consideration. Consider direct visualization of the bronchus as clinically indicated. Scattered opacities within the right lower lung likely represent atelectasis. These results will be called to the ordering clinician or representative by the Radiologist Assistant, and communication documented in the PACS or zVision Dashboard. Electronically Signed   By: Annia Beltrew  Davis M.D.   On: 01/20/2015 09:11   Dg Chest Port 1 View  01/18/2015  CLINICAL DATA:  Acute worsening of shortness of breath this afternoon. Followup left pleural effusion and left lower lobe atelectasis versus pneumonia. Acute left lower lobe pulmonary embolus 01/13/2015. EXAM: PORTABLE CHEST 1 VIEW COMPARISON:  01/17/2015 and earlier, including CTA chest 01/13/2015. FINDINGS: Interval near complete opacification of the left hemithorax since yesterday, with only minimal aerated lung the left apex. Cardiac silhouette enlarged, though obscured by the opacified left hemithorax. Increased pulmonary vascular congestion without overt edema. Stable mild atelectasis at the right lung base. No visible right pleural effusion. IMPRESSION: 1. Near complete opacification of the left hemithorax with consolidation now present throughout most of the left lung. As there is no significant mediastinal shift, this is likely a combination of worsening atelectasis and worsening pneumonia. 2. Stable mild right basilar atelectasis. 3. Worsening pulmonary vascular congestion without overt edema. These results will be called to the ordering clinician or representative by the Radiologist Assistant, and communication documented in the PACS or zVision Dashboard. Electronically Signed   By: Hulan Saashomas  Lawrence M.D.   On: 01/18/2015 17:32  SIGNIFICANT EVENTS  12/31  Admit to [facility] 01/05  Transfer [facility].  STUDIES:  Echo 03/08/07 >> EF  55%, mild diastolic dysfx, mild/mod LA/RA dilation, mild MR, mild TR, PAS 25 mmHg Spirometry 06/01/12 >> FEV1 1.52 (58%), FEV1% 70; mixed obstructive/restrictive defect BiPAP 09/28/13 to 10/27/13 >> used on 30 of 30 nights with average 14 hrs and 29 min. Average AHI is 1.7 with BiPAP 12/8 cm H2O. CTA Chest 12/31>> large pulmonary embolism in the left main pulmonary artery extending into the left lower lobe. Atelectasis in the left lower lobe. Chronic dilation of distal esophagus. ECHO 1/4 >>  Moderate LVH, EF 65- 70%, no RWMA's, possible anterior pericardial effusion vs tissue density. "grossly normal RV, cannot assess RVSP" LE Doppler 1/6 >> negative  ABX:  Levaquin 1/4 >>   CULTURES:  UC 12/30 >>    Discussion: 75 year old male with PMH of chronic L plueral effusion deemed not a surgical candidate who presented to [facility] 12/31 with hemoptysis. Found to have large left pulmonary embolism. 1/5 had increased work of breathing and increased oxygen demands. He was transferred to [facility] for further evaluation. 1/7 overnight he had severe hemoptysis, respiratory failure, and not has mediastinal shift and total left upper lobe collapse in addition to his chronic left effusion seen on CXR (personally reviewed).  He and his wife tell me clearly that he is a DNR, he hated going on the vent last time and he doesn't want to do it again.  Mucomyst was likely contributing to his hemoptysis.  He needs good chest PT. Does not have a DVT, so no role for IVC filter right now High risk situation, he cannot get heparin right now Prognosis guarded Stable at this point, does not need mechanical ventilation nor does he want it.   ASSESSMENT / PLAN:  Left hemithorax opacification on CXR - due to atelectasis from mucus plugging/blood and chronic pleural effusion; tapping the effusion would cause re-expansion pulmonary edema and likely death as it is a chronic effusion (years) causing lung  entrapment.  Acute on chronic respiratory failure secondary to pulmonary embolism on chronic OHS, OSA, and COPD Pulmonary embolism with suspected lung infarction Hemoptysis HCAP now? Rising WBC  Plan: - no thoracentesis - stop mucomyst> causing hemoptysis - stop heparin today, consider restart tomorrow - start hypertonic saline nebs bid - continue albuterol - BIPAP QHS and PRN - Chest PT - out of bed as able - Incentive spiro and flutter valve - Supplemental O2 to keep SpO2 88-92% - continue levaquin, add vanc/aztreonam given rising WBC, possible HCAP  Wife updated bedside at length about risk of this and risk of death.  Patient and wife voiced understanding  He is DNR, see discussion above  My cc time 40 minutes  Heber [place], MD [place] [facility] Pager: [phone] Cell: [phone] After 3pm or if no response, call [phone]

## 2015-01-20 NOTE — Progress Notes (Signed)
Patient Demographics  [name] [name], is a 75 y.o. male, DOB - Aug 30, 1940, GUR:[id number]  Admit date - 05-20-2014   Admitting Physician Haydee Monicaachal A David, MD  Outpatient Primary MD for the patient is [name], [name], MD  LOS - 7   Chief Complaint  Patient presents with  . Shortness of Breath  . Coughing up blood        Admission HPI/Brief narrative: 75 year old male with PMHof  diabetes, GERD, OSA/OHS on nocturnal BiPAP, COPD , atrial fibrillation, chronic left pleural effusion, and hypertension presents to any pain with complaints of dyspnea and hemoptysis, CTA chest was significant for pulmonary embolism, transitioned to Eliiquis, developed hematemesis, currently on heparin GTT, a shunt with worsening respiratory failure, with increased oxygen demand, requiring BiPAP, as well Hospital course: Dictated by dysphagia, unstable for EGD, [facility] for possible thoracentesis, seen by pulmonary who thought his pleural effusion at baseline, and his worsening respiratory failure more likely related to atelectasis/mucus plugging.  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 - Continue with BiPAP daily at bedtime and when necessary - as well worsened secondary atelectasis from mucous plugging and chronic pleural effusion, treated with pulmonary toilet, chest PT as per pulmonary recommendation.  Pulmonary embolism with suspected lung infarction/hemoptysis - Initially on Eliquis , currently changed to heparin GTT, was stopped overnight given significant hemoptysis, hold for at least 24 hours per pulmonary recommendation. - not a candidate for systemic or catheter lysis - lower extremity Doppler with no evidence of DVT, no role for IVC filter.   Left hemithorax opacification on chest x-ray - Patient with known chronic left pleural effusion has been seen by Dr. Craige CottaSood as outpatient. - Within evidence of worsening left hemithorax, this is due to atelectasis from mucus plugging/blood and chronic pleural effusion. - Continue with pulmonary toilet, chest PT, Mucomyst was stopped, patient started on hypertonic saline.  HCAP - Patient is on levofloxacin, continues to have worsening leukocytosis, vancomycin and aztreonam was added by pulmonary.   Chronic atrial fibrillation - CHA2DS2-VASc greater than 3, initially on eliquis, switched to heparin GTT, currently on hold secondary to hemoptysis - heart rate controlled on diltiazem  Dysphagia - Patient with known esophageal motility disorder,and large epiphrenic diverticulum, this could be secondary to reflux esophagitis versus foof filled diverticulum - [facility] - unstable for EGD  Elevated troponin - secondary to PE  Leukocytosis - Stress related secondary to PE and pulmonary infarction, continue with levofloxacin, remains afebrile  Diabetes mellitus -  Continue with insulin sliding scale, CBGs uncontrolled, will increase Lantus from 45 units daily to 45units twice a day  Oral thrush - continue with Diflucan  Renal insufficiency - recheck BMP in a.m.  Code  Status: full  Family Communication: wife at bedside  Disposition Plan: Pending further work up.   Procedures  none   Consults   Gastroenterology- Dr. Karilyn Cota at [facility]     Medications  Scheduled Meds: . albuterol  3 mL Inhalation Q6H  . allopurinol  300 mg Oral Daily  . antiseptic oral rinse  7 mL Mouth Rinse q12n4p  . aztreonam  1 g Intravenous 3 times per day  . chlorhexidine  15 mL Mouth Rinse BID  . diltiazem  240 mg Oral Daily  . docusate sodium  100 mg Oral Daily  . finasteride  5 mg Oral Daily  . fluconazole  100 mg Oral Daily  . furosemide  40 mg Intravenous Q12H  . guaiFENesin  1,200 mg Oral BID  . insulin aspart  0-15 Units Subcutaneous TID WC  . insulin aspart  0-5 Units Subcutaneous QHS  . insulin aspart  4 Units Subcutaneous TID WC  . insulin glargine  30 Units Subcutaneous BID  . levofloxacin (LEVAQUIN) IV  500 mg Intravenous Q24H  . lidocaine  5 mL Mouth/Throat TID   And  . magic mouthwash  5 mL Oral TID  . pantoprazole (PROTONIX) IV  40 mg Intravenous Q24H  . potassium chloride  20 mEq Oral Daily  . sodium chloride HYPERTONIC  5 mL Nebulization BID  . [START ON 01/21/2015] vancomycin  1,250 mg Intravenous Q24H  . vancomycin  2,500 mg Intravenous Once   Continuous Infusions: . heparin Stopped (01/19/15 2205)   PRN Meds:.acetaminophen, dextromethorphan-guaiFENesin, magic mouthwash, ondansetron **OR** ondansetron (ZOFRAN) IV  DVT Prophylaxis  Heparin GTT  Lab Results  Component Value Date   PLT 269 01/20/2015    Antibiotics   Anti-infectives    Start     Dose/Rate Route Frequency Ordered Stop   01/21/15 1200  vancomycin (VANCOCIN) 1,250 mg in sodium chloride 0.9 % 250 mL IVPB     1,250 mg 166.7 mL/hr over 90 Minutes Intravenous Every 24 hours 01/20/15 1112     01/20/15 1115  vancomycin (VANCOCIN) 2,500 mg in sodium chloride 0.9 % 500 mL IVPB     2,500 mg 250 mL/hr over 120 Minutes Intravenous  Once 01/20/15 1112     01/20/15  1115  aztreonam (AZACTAM) 1 g in dextrose 5 % 50 mL IVPB     1 g 100 mL/hr over 30 Minutes Intravenous 3 times per day 01/20/15 1112     01/17/15 0915  levofloxacin (LEVAQUIN) IVPB 500 mg     500 mg 100 mL/hr over 60 Minutes Intravenous Every 24 hours 01/17/15 0906     01/15/15 1100  fluconazole (DIFLUCAN) tablet 100 mg     100 mg Oral Daily 01/15/15 1057 01/22/15 0959   01/13/15 0930  ciprofloxacin (CIPRO) IVPB 400 mg  Status:  Discontinued     400 mg 200 mL/hr over 60 Minutes Intravenous Every 12 hours 01/13/15 0841 01/17/15 0906          Objective:   Filed Vitals:   01/20/15 0123 01/20/15 0409 01/20/15 0858 01/20/15 1027  BP:  113/62  117/65  Pulse: 101 81    Temp:  97.8 F (36.6 C)    TempSrc:  Axillary    Resp: 28 30    Height:  Weight:  110.179 kg (242 lb 14.4 oz)    SpO2: 93% 98% 96%     Wt Readings from Last 3 Encounters:  01/20/15 110.179 kg (242 lb 14.4 oz)  12/02/13 129.729 kg (286 lb)  10/21/12 133.358 kg (294 lb)     Intake/Output Summary (Last 24 hours) at 01/20/15 1227 Last data filed at 01/20/15 0958  Gross per 24 hour  Intake    540 ml  Output   2575 ml  Net  -2035 ml     Physical Exam  Awake Alert, Oriented X 3, n BiPAP [place].AT,PERRAL Supple Neck,No JVD, No cervical lymphadenopathy appriciated.  Symmetrical Chest wall movement,decreased air entry in the left,no wheezing RRR,No Gallops,Rubs or new Murmurs, No Parasternal Heave +ve B.Sounds, Abd Soft, No tenderness, No organomegaly appriciated, No rebound - guarding or rigidity. No Cyanosis, Clubbing or edema, No new Rash or bruise     Data Review   Micro Results Recent Results (from the past 240 hour(s))  Culture, Urine     Status: None   Collection Time: 01/30/2015 11:50 PM  Result Value Ref Range Status   Specimen Description URINE, CLEAN CATCH  Final   Special Requests NONE  Final   Culture   Final    MULTIPLE SPECIES PRESENT, SUGGEST RECOLLECTION Performed at [facility]    Report Status 01/15/2015 FINAL  Final  MRSA PCR Screening     Status: None   Collection Time: 01/13/15  1:09 AM  Result Value Ref Range Status   MRSA by PCR NEGATIVE NEGATIVE Final    Comment:        The GeneXpert MRSA Assay (FDA approved for NASAL specimens only), is one component of a comprehensive MRSA colonization surveillance program. It is not intended to diagnose MRSA infection nor to guide or monitor treatment for MRSA infections.     Radiology Reports Ct Angio Chest Pe W/cm &/or Wo Cm  01/13/2015  CLINICAL DATA:  Hemoptysis and shortness of breath. EXAM: CT ANGIOGRAPHY CHEST WITH CONTRAST TECHNIQUE: Multidetector CT imaging of the chest was performed using the standard protocol during bolus administration of intravenous contrast. Multiplanar CT image reconstructions and MIPs were obtained to evaluate the vascular anatomy. CONTRAST:  75mL OMNIPAQUE IOHEXOL 350 MG/ML SOLN COMPARISON:  CT angiogram dated 01/27/2007 and CT scan dated 07/01/2007 FINDINGS: There is a large pulmonary embolus in the left main pulmonary artery extending into the left lower lobe. There is focal atelectasis in slight consolidation in the left lower lobe peripherally. Again noted is chronic dilatation of the distal esophagus with food material in that area. 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 Has the patient had previous esophageal surgery? This does appear unchanged since 2009. There is chronic slight cardiomegaly.  RV/ LV ratio is normal. The right lung is clear. Pulmonary vascularity is not increased. Very tiny right effusion. No osseous abnormality. No significant abnormality in the upper abdomen. Review of the MIP images confirms the above findings. IMPRESSION: 1. Large pulmonary embolism to the left lower lobe. Atelectasis in the left lower lobe. 2. Chronic dilatation of the distal esophagus with what appears to be calcification in the wall of the esophagus, not  significantly changed since 2009. Critical Value/emergent results were called by telephone at the time of interpretation on 01/13/2015 at 9:45 am to Casimiro Needle, RN , who verbally acknowledged these results. Electronically Signed   By: Francene Boyers M.D.   On: 01/13/2015 09:50   Dg Chest Port 1 View  01/20/2015  CLINICAL DATA:  Patient with shortness of breath. EXAM: PORTABLE CHEST 1 VIEW COMPARISON:  Chest radiograph 01/18/2015. FINDINGS: Monitoring leads overlie the patient. There is complete opacification of the left hemi thorax with leftward mediastinal shift. There is abrupt cut off of the left mainstem bronchus. There is small right pleural effusion with bandlike opacification within the right lower hemi thorax. No pneumothorax. IMPRESSION: Complete opacification of left hemi thorax with leftward mediastinal shift and abrupt cut off of the left mainstem bronchus. Overall findings are worrisome for atelectatic change involving the left lung, potentially secondary to endobronchial lesion or mucous plugging within left mainstem bronchus. Superimposed infectious process within left lung is an additional consideration. Consider direct visualization of the bronchus as clinically indicated. Scattered opacities within the right lower lung likely represent atelectasis. These results will be called to the ordering clinician or representative by the Radiologist Assistant, and communication documented in the PACS or zVision Dashboard. Electronically Signed   By: Annia Belt M.D.   On: 01/20/2015 09:11   Dg Chest Port 1 View  01/18/2015  CLINICAL DATA:  Acute worsening of shortness of breath this afternoon. Followup left pleural effusion and left lower lobe atelectasis versus pneumonia. Acute left lower lobe pulmonary embolus 01/13/2015. EXAM: PORTABLE CHEST 1 VIEW COMPARISON:  01/17/2015 and earlier, including CTA chest 01/13/2015. FINDINGS: Interval near complete opacification of the left hemithorax since yesterday,  with only minimal aerated lung the left apex. Cardiac silhouette enlarged, though obscured by the opacified left hemithorax. Increased pulmonary vascular congestion without overt edema. Stable mild atelectasis at the right lung base. No visible right pleural effusion. IMPRESSION: 1. Near complete opacification of the left hemithorax with consolidation now present throughout most of the left lung. As there is no significant mediastinal shift, this is likely a combination of worsening atelectasis and worsening pneumonia. 2. Stable mild right basilar atelectasis. 3. Worsening pulmonary vascular congestion without overt edema. These results will be called to the ordering clinician or representative by the Radiologist Assistant, and communication documented in the PACS or zVision Dashboard. Electronically Signed   By: Hulan Saas M.D.   On: 01/18/2015 17:32   Dg Chest Port 1 View  01/17/2015  CLINICAL DATA:  Pneumonia. EXAM: PORTABLE CHEST 1 VIEW COMPARISON:  January 12, 2015. FINDINGS: Stable cardiomegaly is noted. No pneumothorax is noted. Central pulmonary vascular congestion is again noted. Mild right basilar opacity is noted concerning for edema or subsegmental atelectasis. Stable moderate left pleural effusion is noted with underlying atelectasis, edema or infiltrate. Bony thorax is unremarkable. IMPRESSION: Stable cardiomegaly and central pulmonary vascular congestion. Stable moderate left pleural effusion with underlying atelectasis, infiltrate or edema. Electronically Signed   By: Lupita Raider, M.D.   On: 01/17/2015 12:55   Dg Chest Portable 1 View  12/17/2014  CLINICAL DATA:  75 year old male with shortness of breath and cough EXAM: PORTABLE CHEST 1 VIEW COMPARISON:  Chest radiograph dated 06/01/2012 FINDINGS: No significant change in the previously seen left mid and lower lung field opacity. There is silhouetting of the left cardiac border and left hemidiaphragm. Multiple findings may be related  to prominent pericardial fat and hiatal hernia, a pleural effusion or pneumonia is not excluded. Right lung base subsegmental atelectatic changes noted. There is no pneumothorax. The cardiac borders are silhouetted. The osseous structures appear unremarkable. IMPRESSION: No significant change in left mid to lower lung field opacity. Electronically Signed   By: Elgie Collard M.D.   On: 12/24/2014 18:58     CBC  Recent Labs Lab 01/16/15 0650 01/17/15 0536 01/18/15 0532 01/19/15 0500 01/20/15 0434  WBC 16.2* 18.2* 18.2* PATIENT DISCHARGED OR EXPIRED 19.4*  HGB 13.2 12.2* 11.8* PATIENT DISCHARGED OR EXPIRED 9.3*  HCT 41.1 39.2 37.1* PATIENT DISCHARGED OR EXPIRED 30.3*  PLT 232 248 262 PATIENT DISCHARGED OR EXPIRED 269  MCV 96.0 96.1 96.6 PATIENT DISCHARGED OR EXPIRED 95.3  MCH 30.8 29.9 30.7 PATIENT DISCHARGED OR EXPIRED 29.2  MCHC 32.1 31.1 31.8 PATIENT DISCHARGED OR EXPIRED 30.7  RDW 15.2 15.2 15.4 PATIENT DISCHARGED OR EXPIRED 15.2  LYMPHSABS  --   --  3.7 PENDING  --   MONOABS  --   --  1.4* PENDING  --   EOSABS  --   --  0.4 PENDING  --   BASOSABS  --   --  0.1 PENDING  --     Chemistries   Recent Labs Lab 01/17/15 0536 01/18/15 0532  NA 139 137  K 4.1 4.2  CL 98* 97*  CO2 35* 34*  GLUCOSE 188* 164*  BUN 34* 32*  CREATININE 1.47* 1.61*  CALCIUM 8.6* 8.6*  AST  --  16  ALT  --  16*  ALKPHOS  --  100  BILITOT  --  0.5   ------------------------------------------------------------------------------------------------------------------ estimated creatinine clearance is 47.7 mL/min (by C-G formula based on Cr of 1.61). ------------------------------------------------------------------------------------------------------------------ No results for input(s): HGBA1C in the last 72 hours. ------------------------------------------------------------------------------------------------------------------ No results for input(s): CHOL, HDL, LDLCALC, TRIG, CHOLHDL, LDLDIRECT in  the last 72 hours. ------------------------------------------------------------------------------------------------------------------ No results for input(s): TSH, T4TOTAL, T3FREE, THYROIDAB in the last 72 hours.  Invalid input(s): FREET3 ------------------------------------------------------------------------------------------------------------------ No results for input(s): VITAMINB12, FOLATE, FERRITIN, TIBC, IRON, RETICCTPCT in the last 72 hours.  Coagulation profile  Recent Labs Lab 01/18/15 0532  INR 2.02*    No results for input(s): DDIMER in the last 72 hours.  Cardiac Enzymes  Recent Labs Lab 01/17/15 1230 01/17/15 1827 01/18/15 0009  TROPONINI 0.10* 0.09* 0.11*   ------------------------------------------------------------------------------------------------------------------ Invalid input(s): POCBNP     Time Spent in minutes   35 minutes   Wannetta Langland M.D on 01/20/2015 at 12:27 PM  Between 7am to 7pm - Pager - [phone]  After 7pm go to [email or web address] - password [facility]  [facility]   Office  [phone]

## 2015-01-20 NOTE — Progress Notes (Signed)
Desat alarm received to [employer] phone at ~2130.  Pt found lying in bed with CPAP mask off.  O2 sats 60%.  Pt awake and alert, hemoptysis noted.  Pt coughing and suctioning mouth with yankauer, bloody sputum noted.  Wife states "he had to take the mask off because he was coughing up blood."  Pt placed on 6L/[place] and O2 sats increased to 81%.  Unable to keep sats above 81%.  Pt then placed on non-rebreather and RT and rapid response notified.  O2 sats in the 90s when RT arrived, and RT placed pt back on [place] at 6 lpm d/t concern that pt retains CO2.  Dr. Zonia KiefStephens and Dr. Craige CottaSood with [facility] notified of the above.  Heparin gtt stopped per MD order.  O2 sats stable in the low 90s on 6L/[place].  Will hold CPAP for now until hemoptysis resolves.  Will continue to monitor.  Alonza Bogusuvall, Casia Corti Gray

## 2015-01-20 NOTE — Progress Notes (Addendum)
ANTIBIOTIC/ANTICOAGULANT CONSULT NOTE   Pharmacy Consult for vancomycin, aztreonam (IV heparin on hold) Indication: HCAP, PE  Allergies  Allergen Reactions  . Cefaclor     Reactions are unknown  . Cephalexin     Reactions are unknown  . Sulfamethoxazole-Trimethoprim     Reactions are unknown    Patient Measurements: Height: 5\' 8"  (172.7 cm) Weight: 242 lb 14.4 oz (110.179 kg) IBW/kg (Calculated) : 68.4 Adjusted Body Weight: n/a  Vital Signs: Temp: 97.8 F (36.6 C) (01/07 0409) Temp Source: Axillary (01/07 0409) BP: 117/65 mmHg (01/07 1027) Pulse Rate: 81 (01/07 0409) Intake/Output from previous day: 01/06 0701 - 01/07 0700 In: 900 [P.O.:900] Out: 2825 [Urine:2825] Intake/Output from this shift: Total I/O In: -  Out: 750 [Urine:750]  Labs:  Recent Labs  01/18/15 0532 01/19/15 0500 01/20/15 0434  WBC 18.2* PATIENT DISCHARGED OR EXPIRED 19.4*  HGB 11.8* PATIENT DISCHARGED OR EXPIRED 9.3*  PLT 262 PATIENT DISCHARGED OR EXPIRED 269  CREATININE 1.61*  --   --    Estimated Creatinine Clearance: 47.7 mL/min (by C-G formula based on Cr of 1.61). No results for input(s): VANCOTROUGH, VANCOPEAK, VANCORANDOM, GENTTROUGH, GENTPEAK, GENTRANDOM, TOBRATROUGH, TOBRAPEAK, TOBRARND, AMIKACINPEAK, AMIKACINTROU, AMIKACIN in the last 72 hours.   Microbiology: Recent Results (from the past 720 hour(s))  Culture, Urine     Status: None   Collection Time: 12/18/2014 11:50 PM  Result Value Ref Range Status   Specimen Description URINE, CLEAN CATCH  Final   Special Requests NONE  Final   Culture   Final    MULTIPLE SPECIES PRESENT, SUGGEST RECOLLECTION Performed at [facility]    Report Status 01/15/2015 FINAL  Final  MRSA PCR Screening     Status: None   Collection Time: 01/13/15  1:09 AM  Result Value Ref Range Status   MRSA by PCR NEGATIVE NEGATIVE Final    Comment:        The GeneXpert MRSA Assay (FDA approved for NASAL specimens only), is one component of  a comprehensive MRSA colonization surveillance program. It is not intended to diagnose MRSA infection nor to guide or monitor treatment for MRSA infections.     Medical History: Past Medical History  Diagnosis Date  . Unspecified essential hypertension   . Type II or unspecified type diabetes mellitus without mention of complication, not stated as uncontrolled   . Esophageal reflux   . Gout   . Depressive disorder, not elsewhere classified   . OSA (obstructive sleep apnea)     BiPAP  . COPD (chronic obstructive pulmonary disease) (HCC)   . Respiratory failure (HCC)   . BPH (benign prostatic hyperplasia)   . Pleural effusion, left 06/14/2007        . Pulmonary hypertension (HCC)   . Cor pulmonale (HCC)     R heart failure  . Diverticulosis   . CAD (coronary artery disease)     non-obsctructive   . Obesity   . History of nuclear stress test 06/18/2006    [facility]; normal myoview with diminished oxygen sats   . CHF (congestive heart failure) (HCC)   . Chronic atrial fibrillation [facility])     Assessment: 75 yo male with acute on chronic respiratory failure due to PE on chronic OHS, OSA, COPD, and PE with suspected lung infarction.  Now with suspicion for HCAP due to rising WBC.  Pharmacy asked to broaden antibiotic coverage with vancomycin and aztreonam (penicillin allergy).  Scr 1.61, CrCl ~ 47 ml/min.  Also IV heparin currently  held due to ongoing hemoptysis, falling hemoglobin.  PTT this AM at baseline at 34, heparin level remains elevated at 1.5 (falsely elevated due to recent Eliquis).  Goal of Therapy:  Vancomycin trough level 15-20 mcg/ml  Plan:  1. Vancomycin 2500 mg IV x 1.  Then start vancomycin 1250 mg IV q24 hrs for now. 2. Aztreonam 1g IV q 8 hrs. 3. Watch BMET, cultures, and clinical course. 4. Continue to hold heparin, f/u plans to resume.  Tad MooreJessica Keil Pickering, Pharm D, BCPS  Clinical Pharmacist Pager [phone]  01/20/2015 11:20 AM

## 2015-01-21 LAB — CBC
HCT: 29 % — ABNORMAL LOW (ref 39.0–52.0)
HEMOGLOBIN: 9.2 g/dL — AB (ref 13.0–17.0)
MCH: 30.2 pg (ref 26.0–34.0)
MCHC: 31.7 g/dL (ref 30.0–36.0)
MCV: 95.1 fL (ref 78.0–100.0)
Platelets: 265 10*3/uL (ref 150–400)
RBC: 3.05 MIL/uL — ABNORMAL LOW (ref 4.22–5.81)
RDW: 15.6 % — AB (ref 11.5–15.5)
WBC: 17.2 10*3/uL — ABNORMAL HIGH (ref 4.0–10.5)

## 2015-01-21 LAB — BASIC METABOLIC PANEL
ANION GAP: 10 (ref 5–15)
BUN: 61 mg/dL — AB (ref 6–20)
CALCIUM: 8.2 mg/dL — AB (ref 8.9–10.3)
CO2: 35 mmol/L — AB (ref 22–32)
CREATININE: 1.99 mg/dL — AB (ref 0.61–1.24)
Chloride: 94 mmol/L — ABNORMAL LOW (ref 101–111)
GFR calc Af Amer: 36 mL/min — ABNORMAL LOW (ref >60)
GFR, EST NON AFRICAN AMERICAN: 31 mL/min — AB (ref >60)
GLUCOSE: 143 mg/dL — AB (ref 65–99)
Potassium: 3.9 mmol/L (ref 3.5–5.1)
Sodium: 139 mmol/L (ref 135–145)

## 2015-01-21 LAB — GLUCOSE, CAPILLARY
GLUCOSE-CAPILLARY: 161 mg/dL — AB (ref 65–99)
GLUCOSE-CAPILLARY: 119 mg/dL — AB (ref 65–99)
GLUCOSE-CAPILLARY: 107 mg/dL — AB (ref 65–99)
Glucose-Capillary: 137 mg/dL — ABNORMAL HIGH (ref 65–99)

## 2015-01-21 MED ORDER — LEVOFLOXACIN IN D5W 750 MG/150ML IV SOLN
750.0000 mg | INTRAVENOUS | Status: DC
  Administered 2015-01-22 – 2015-01-24 (×2): 750 mg via INTRAVENOUS
  Filled 2015-01-21 (×2): qty 150

## 2015-01-21 NOTE — Progress Notes (Signed)
CPAP not be placed at this time due to patient coughing up bloody secretions and having difficulty swallowing. RN aware.

## 2015-01-21 NOTE — Progress Notes (Signed)
Patient Demographics  [name] FuelWilliam Tourangeau, is a 75 y.o. male, DOB - 06-26-1940, ZOX:[id number]  Admit date - Jan 15, 2014   Admitting Physician Haydee Monicaachal A David, MD  Outpatient Primary MD for the patient is [name], [name], MD  LOS - 8   Chief Complaint  Patient presents with  . Shortness of Breath  . Coughing up blood        Admission HPI/Brief narrative: 75 year old male with PMHof  diabetes, GERD, OSA/OHS on nocturnal BiPAP, COPD , atrial fibrillation, chronic left pleural effusion, and hypertension presents to any pain with complaints of dyspnea and hemoptysis, CTA chest was significant for pulmonary embolism, transitioned to Eliiquis, developed hematemesis, currently on heparin GTT, a shunt with worsening respiratory failure, with increased oxygen demand, requiring BiPAP, as well Hospital course: Dictated by dysphagia, unstable for EGD, patient [facility] for possible thoracentesis, seen by pulmonary who thought his pleural effusion at baseline, and his worsening respiratory failure more likely related to atelectasis/mucus plugging.  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 - Continue with BiPAP daily at bedtime and when necessary. - as well worsened secondary atelectasis from mucous plugging and chronic pleural effusion, treated with pulmonary toilet, chest PT as per pulmonary recommendation.  Pulmonary embolism with suspected lung infarction/hemoptysis - Initially on Eliquis , changed to heparin GTT given his hematemesis, patient with significant amount of hematemesis 1//2017, where his heparin gtt. Has been stopped, will discuss with pulmonary as appropriate to resume - not a candidate for systemic or catheter lysis - lower extremity Doppler with no evidence of DVT, no role for IVC filter.   Left hemithorax opacification on chest x-ray - Patient with known chronic left pleural effusion has been seen by Dr. Craige CottaSood as outpatient. - Within evidence of worsening left hemithorax, this is due to atelectasis from mucus plugging/blood and chronic pleural effusion. - Continue with pulmonary toilet, chest PT, Mucomyst was stopped, patient started on hypertonic saline. - repeat chestx-ray in a.m.  HCAP - Patient is on levofloxacin, started on vancomycin and aztreonam 01/20/2015 by pulmonary.   Chronic atrial fibrillation - CHA2DS2-VASc greater than 3, initially on eliquis, switched to heparin GTT, currently on hold secondary to hemoptysis - heart rate controlled on diltiazem  Dysphagia - Patient with known esophageal motility disorder,and large epiphrenic diverticulum, this could be secondary to reflux esophagitis versus foof filled diverticulum - seen by GI dr. Kelvin Cellarehmann at [facility]. - unstable for EGD  Elevated troponin - secondary to PE  Leukocytosis - Stress related secondary to  PE and pulmonary infarction, continue with levofloxacin, remains afebrile  Diabetes mellitus - Continue with insulin sliding scale, CBGs uncontrolled, will increase Lantus from 45 units daily to 45units twice a day  Oral thrush - continue with  Diflucan  Renal insufficiency - recheck BMP in a.m.  Code Status: full  Family Communication: wife at bedside  Disposition Plan: Pending further work up.   Procedures  none   Consults   Gastroenterology- Dr. Karilyn Cota at [facility]     Medications  Scheduled Meds: . albuterol  3 mL Inhalation Q6H  . allopurinol  300 mg Oral Daily  . antiseptic oral rinse  7 mL Mouth Rinse q12n4p  . aztreonam  1 g Intravenous 3 times per day  . chlorhexidine  15 mL Mouth Rinse BID  . diltiazem  240 mg Oral Daily  . docusate sodium  100 mg Oral Daily  . finasteride  5 mg Oral Daily  . furosemide  40 mg Intravenous Q12H  . guaiFENesin  1,200 mg Oral BID  . insulin aspart  0-15 Units Subcutaneous TID WC  . insulin aspart  0-5 Units Subcutaneous QHS  . insulin aspart  4 Units Subcutaneous TID WC  . insulin glargine  30 Units Subcutaneous BID  . [START ON 01/22/2015] levofloxacin (LEVAQUIN) IV  750 mg Intravenous Q48H  . lidocaine  5 mL Mouth/Throat TID   And  . magic mouthwash  5 mL Oral TID  . pantoprazole (PROTONIX) IV  40 mg Intravenous Q24H  . potassium chloride  20 mEq Oral Daily  . sodium chloride HYPERTONIC  5 mL Nebulization BID  . vancomycin  1,250 mg Intravenous Q24H   Continuous Infusions:   PRN Meds:.acetaminophen, dextromethorphan-guaiFENesin, magic mouthwash, ondansetron **OR** ondansetron (ZOFRAN) IV  DVT Prophylaxis  Heparin GTT on hold.  Lab Results  Component Value Date   PLT 265 01/21/2015    Antibiotics   Anti-infectives    Start     Dose/Rate Route Frequency Ordered Stop   01/22/15 1400  levofloxacin (LEVAQUIN) IVPB 750 mg     750 mg 100 mL/hr over 90 Minutes Intravenous Every 48 hours 01/21/15 1401     01/21/15 1200  vancomycin (VANCOCIN) 1,250 mg in sodium chloride 0.9 % 250 mL IVPB     1,250 mg 166.7 mL/hr over 90 Minutes Intravenous Every 24 hours 01/20/15 1112     01/20/15 1115  vancomycin (VANCOCIN) 2,500 mg in sodium chloride 0.9 % 500  mL IVPB     2,500 mg 250 mL/hr over 120 Minutes Intravenous  Once 01/20/15 1112 01/20/15 1411   01/20/15 1115  aztreonam (AZACTAM) 1 g in dextrose 5 % 50 mL IVPB     1 g 100 mL/hr over 30 Minutes Intravenous 3 times per day 01/20/15 1112     01/17/15 0915  levofloxacin (LEVAQUIN) IVPB 500 mg  Status:  Discontinued     500 mg 100 mL/hr over 60 Minutes Intravenous Every 24 hours 01/17/15 0906 01/21/15 1401   01/15/15 1100  fluconazole (DIFLUCAN) tablet 100 mg     100 mg Oral Daily 01/15/15 1057 01/21/15 1155   01/13/15 0930  ciprofloxacin (CIPRO) IVPB 400 mg  Status:  Discontinued     400 mg 200 mL/hr over 60 Minutes Intravenous Every 12 hours 01/13/15 0841 01/17/15 0906          Objective:   Filed Vitals:   01/21/15 0450 01/21/15 0750 01/21/15 0948 01/21/15 1158  BP: 130/58   110/55  Pulse: 69  Temp: 98.5 F (36.9 C) 97.8 F (36.6 C)    TempSrc:  Oral    Resp: 22     Height:      Weight: 106.232 kg (234 lb 3.2 oz)     SpO2: 96%  96%     Wt Readings from Last 3 Encounters:  01/21/15 106.232 kg (234 lb 3.2 oz)  12/02/13 129.729 kg (286 lb)  10/21/12 133.358 kg (294 lb)     Intake/Output Summary (Last 24 hours) at 01/21/15 1404 Last data filed at 01/21/15 1300  Gross per 24 hour  Intake    530 ml  Output   3125 ml  Net  -2595 ml     Physical Exam  Awake Alert, Oriented X 3, n BiPAP Dunn.AT,PERRAL Supple Neck,No JVD, No cervical lymphadenopathy appriciated.  Symmetrical Chest wall movement,decreased air entry in the left,no wheezing RRR,No Gallops,Rubs or new Murmurs, No Parasternal Heave +ve B.Sounds, Abd Soft, No tenderness, No organomegaly appriciated, No rebound - guarding or rigidity. No Cyanosis, Clubbing or edema, No new Rash or bruise     Data Review   Micro Results Recent Results (from the past 240 hour(s))  Culture, Urine     Status: None   Collection Time: 01/05/2015 11:50 PM  Result Value Ref Range Status   Specimen Description URINE, CLEAN  CATCH  Final   Special Requests NONE  Final   Culture   Final    MULTIPLE SPECIES PRESENT, SUGGEST RECOLLECTION Performed at [facility]    Report Status 01/15/2015 FINAL  Final  MRSA PCR Screening     Status: None   Collection Time: 01/13/15  1:09 AM  Result Value Ref Range Status   MRSA by PCR NEGATIVE NEGATIVE Final    Comment:        The GeneXpert MRSA Assay (FDA approved for NASAL specimens only), is one component of a comprehensive MRSA colonization surveillance program. It is not intended to diagnose MRSA infection nor to guide or monitor treatment for MRSA infections.     Radiology Reports Ct Angio Chest Pe W/cm &/or Wo Cm  01/13/2015  CLINICAL DATA:  Hemoptysis and shortness of breath. EXAM: CT ANGIOGRAPHY CHEST WITH CONTRAST TECHNIQUE: Multidetector CT imaging of the chest was performed using the standard protocol during bolus administration of intravenous contrast. Multiplanar CT image reconstructions and MIPs were obtained to evaluate the vascular anatomy. CONTRAST:  75mL OMNIPAQUE IOHEXOL 350 MG/ML SOLN COMPARISON:  CT angiogram dated 01/27/2007 and CT scan dated 07/01/2007 FINDINGS: There is a large pulmonary embolus in the left main pulmonary artery extending into the left lower lobe. There is focal atelectasis in slight consolidation in the left lower lobe peripherally. Again noted is chronic dilatation of the distal esophagus with food material in that area. 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 Has the patient had previous esophageal surgery? This does appear unchanged since 2009. There is chronic slight cardiomegaly.  RV/ LV ratio is normal. The right lung is clear. Pulmonary vascularity is not increased. Very tiny right effusion. No osseous abnormality. No significant abnormality in the upper abdomen. Review of the MIP images confirms the above findings. IMPRESSION: 1. Large pulmonary embolism to the left lower lobe. Atelectasis in the  left lower lobe. 2. Chronic dilatation of the distal esophagus with what appears to be calcification in the wall of the esophagus, not significantly changed since 2009. Critical Value/emergent results were called by telephone at the time of interpretation on 01/13/2015 at 9:45 am to Casimiro Needle,  RN , who verbally acknowledged these results. Electronically Signed   By: Francene Boyers M.D.   On: 01/13/2015 09:50   Dg Chest Port 1 View  01/20/2015  CLINICAL DATA:  Patient with shortness of breath. EXAM: PORTABLE CHEST 1 VIEW COMPARISON:  Chest radiograph 01/18/2015. FINDINGS: Monitoring leads overlie the patient. There is complete opacification of the left hemi thorax with leftward mediastinal shift. There is abrupt cut off of the left mainstem bronchus. There is small right pleural effusion with bandlike opacification within the right lower hemi thorax. No pneumothorax. IMPRESSION: Complete opacification of left hemi thorax with leftward mediastinal shift and abrupt cut off of the left mainstem bronchus. Overall findings are worrisome for atelectatic change involving the left lung, potentially secondary to endobronchial lesion or mucous plugging within left mainstem bronchus. Superimposed infectious process within left lung is an additional consideration. Consider direct visualization of the bronchus as clinically indicated. Scattered opacities within the right lower lung likely represent atelectasis. These results will be called to the ordering clinician or representative by the Radiologist Assistant, and communication documented in the PACS or zVision Dashboard. Electronically Signed   By: Annia Belt M.D.   On: 01/20/2015 09:11   Dg Chest Port 1 View  01/18/2015  CLINICAL DATA:  Acute worsening of shortness of breath this afternoon. Followup left pleural effusion and left lower lobe atelectasis versus pneumonia. Acute left lower lobe pulmonary embolus 01/13/2015. EXAM: PORTABLE CHEST 1 VIEW COMPARISON:  01/17/2015  and earlier, including CTA chest 01/13/2015. FINDINGS: Interval near complete opacification of the left hemithorax since yesterday, with only minimal aerated lung the left apex. Cardiac silhouette enlarged, though obscured by the opacified left hemithorax. Increased pulmonary vascular congestion without overt edema. Stable mild atelectasis at the right lung base. No visible right pleural effusion. IMPRESSION: 1. Near complete opacification of the left hemithorax with consolidation now present throughout most of the left lung. As there is no significant mediastinal shift, this is likely a combination of worsening atelectasis and worsening pneumonia. 2. Stable mild right basilar atelectasis. 3. Worsening pulmonary vascular congestion without overt edema. These results will be called to the ordering clinician or representative by the Radiologist Assistant, and communication documented in the PACS or zVision Dashboard. Electronically Signed   By: Hulan Saas M.D.   On: 01/18/2015 17:32   Dg Chest Port 1 View  01/17/2015  CLINICAL DATA:  Pneumonia. EXAM: PORTABLE CHEST 1 VIEW COMPARISON:  January 12, 2015. FINDINGS: Stable cardiomegaly is noted. No pneumothorax is noted. Central pulmonary vascular congestion is again noted. Mild right basilar opacity is noted concerning for edema or subsegmental atelectasis. Stable moderate left pleural effusion is noted with underlying atelectasis, edema or infiltrate. Bony thorax is unremarkable. IMPRESSION: Stable cardiomegaly and central pulmonary vascular congestion. Stable moderate left pleural effusion with underlying atelectasis, infiltrate or edema. Electronically Signed   By: Lupita Raider, M.D.   On: 01/17/2015 12:55   Dg Chest Portable 1 View  01/01/2015  CLINICAL DATA:  75 year old male with shortness of breath and cough EXAM: PORTABLE CHEST 1 VIEW COMPARISON:  Chest radiograph dated 06/01/2012 FINDINGS: No significant change in the previously seen left mid and  lower lung field opacity. There is silhouetting of the left cardiac border and left hemidiaphragm. Multiple findings may be related to prominent pericardial fat and hiatal hernia, a pleural effusion or pneumonia is not excluded. Right lung base subsegmental atelectatic changes noted. There is no pneumothorax. The cardiac borders are silhouetted. The osseous structures appear unremarkable.  IMPRESSION: No significant change in left mid to lower lung field opacity. Electronically Signed   By: Elgie Collard M.D.   On: Jan 19, 2015 18:58     CBC  Recent Labs Lab 01/17/15 0536 01/18/15 0532 01/19/15 0500 01/20/15 0434 01/21/15 0809  WBC 18.2* 18.2* PATIENT DISCHARGED OR EXPIRED 19.4* 17.2*  HGB 12.2* 11.8* PATIENT DISCHARGED OR EXPIRED 9.3* 9.2*  HCT 39.2 37.1* PATIENT DISCHARGED OR EXPIRED 30.3* 29.0*  PLT 248 262 PATIENT DISCHARGED OR EXPIRED 269 265  MCV 96.1 96.6 PATIENT DISCHARGED OR EXPIRED 95.3 95.1  MCH 29.9 30.7 PATIENT DISCHARGED OR EXPIRED 29.2 30.2  MCHC 31.1 31.8 PATIENT DISCHARGED OR EXPIRED 30.7 31.7  RDW 15.2 15.4 PATIENT DISCHARGED OR EXPIRED 15.2 15.6*  LYMPHSABS  --  3.7 PENDING  --   --   MONOABS  --  1.4* PENDING  --   --   EOSABS  --  0.4 PENDING  --   --   BASOSABS  --  0.1 PENDING  --   --     Chemistries   Recent Labs Lab 01/17/15 0536 01/18/15 0532 01/21/15 0809  NA 139 137 139  K 4.1 4.2 3.9  CL 98* 97* 94*  CO2 35* 34* 35*  GLUCOSE 188* 164* 143*  BUN 34* 32* 61*  CREATININE 1.47* 1.61* 1.99*  CALCIUM 8.6* 8.6* 8.2*  AST  --  16  --   ALT  --  16*  --   ALKPHOS  --  100  --   BILITOT  --  0.5  --    ------------------------------------------------------------------------------------------------------------------ estimated creatinine clearance is 37.9 mL/min (by C-G formula based on Cr of 1.99). ------------------------------------------------------------------------------------------------------------------ No results for input(s): HGBA1C in the  last 72 hours. ------------------------------------------------------------------------------------------------------------------ No results for input(s): CHOL, HDL, LDLCALC, TRIG, CHOLHDL, LDLDIRECT in the last 72 hours. ------------------------------------------------------------------------------------------------------------------ No results for input(s): TSH, T4TOTAL, T3FREE, THYROIDAB in the last 72 hours.  Invalid input(s): FREET3 ------------------------------------------------------------------------------------------------------------------ No results for input(s): VITAMINB12, FOLATE, FERRITIN, TIBC, IRON, RETICCTPCT in the last 72 hours.  Coagulation profile  Recent Labs Lab 01/18/15 0532  INR 2.02*    No results for input(s): DDIMER in the last 72 hours.  Cardiac Enzymes  Recent Labs Lab 01/17/15 1230 01/17/15 1827 01/18/15 0009  TROPONINI 0.10* 0.09* 0.11*   ------------------------------------------------------------------------------------------------------------------ Invalid input(s): POCBNP     Time Spent in minutes   30 minutes   Tequia Wolman M.D on 01/21/2015 at 2:04 PM  Between 7am to 7pm - Pager - [phone]  After 7pm go to [email or web address] - password [email or web address]  [facility]   Office  [phone]

## 2015-01-21 NOTE — Progress Notes (Signed)
Pharmacy Re: Levaquin  Pt receiving levaquin 500 mg q 24 hrs for possible HCAP. Scr increased today to 1.9, est CrCl ~ 37 ml/min.  Plan: Reduce levaquin to 750 mg IV q 48 hrs.  Tad MooreJessica Arish Redner, Pharm D, BCPS  Clinical Pharmacist Pager [phone]  01/21/2015 2:02 PM

## 2015-01-22 ENCOUNTER — Inpatient Hospital Stay (HOSPITAL_COMMUNITY): Payer: Medicare Other

## 2015-01-22 DIAGNOSIS — J189 Pneumonia, unspecified organism: Secondary | ICD-10-CM | POA: Insufficient documentation

## 2015-01-22 LAB — GLUCOSE, CAPILLARY
GLUCOSE-CAPILLARY: 131 mg/dL — AB (ref 65–99)
GLUCOSE-CAPILLARY: 183 mg/dL — AB (ref 65–99)
GLUCOSE-CAPILLARY: 164 mg/dL — AB (ref 65–99)
Glucose-Capillary: 213 mg/dL — ABNORMAL HIGH (ref 65–99)

## 2015-01-22 LAB — CBC
HCT: 29.4 % — ABNORMAL LOW (ref 39.0–52.0)
Hemoglobin: 9.6 g/dL — ABNORMAL LOW (ref 13.0–17.0)
MCH: 30.8 pg (ref 26.0–34.0)
MCHC: 32.7 g/dL (ref 30.0–36.0)
MCV: 94.2 fL (ref 78.0–100.0)
PLATELETS: ADEQUATE 10*3/uL (ref 150–400)
RBC: 3.12 MIL/uL — AB (ref 4.22–5.81)
RDW: 15.6 % — ABNORMAL HIGH (ref 11.5–15.5)
WBC: 17.7 10*3/uL — AB (ref 4.0–10.5)

## 2015-01-22 LAB — BASIC METABOLIC PANEL
ANION GAP: 15 (ref 5–15)
BUN: 54 mg/dL — ABNORMAL HIGH (ref 6–20)
CALCIUM: 8.3 mg/dL — AB (ref 8.9–10.3)
CO2: 31 mmol/L (ref 22–32)
Chloride: 93 mmol/L — ABNORMAL LOW (ref 101–111)
Creatinine, Ser: 2.07 mg/dL — ABNORMAL HIGH (ref 0.61–1.24)
GFR calc Af Amer: 34 mL/min — ABNORMAL LOW (ref >60)
GFR, EST NON AFRICAN AMERICAN: 30 mL/min — AB (ref >60)
Glucose, Bld: 211 mg/dL — ABNORMAL HIGH (ref 65–99)
POTASSIUM: 4 mmol/L (ref 3.5–5.1)
SODIUM: 139 mmol/L (ref 135–145)

## 2015-01-22 LAB — APTT: APTT: 50 s — AB (ref 24–37)

## 2015-01-22 LAB — HEPARIN LEVEL (UNFRACTIONATED): Heparin Unfractionated: 0.9 IU/mL — ABNORMAL HIGH (ref 0.30–0.70)

## 2015-01-22 MED ORDER — HEPARIN (PORCINE) IN NACL 100-0.45 UNIT/ML-% IJ SOLN
950.0000 [IU]/h | INTRAMUSCULAR | Status: DC
  Administered 2015-01-22: 1200 [IU]/h via INTRAVENOUS
  Administered 2015-01-24: 1100 [IU]/h via INTRAVENOUS
  Filled 2015-01-22 (×3): qty 250

## 2015-01-22 NOTE — Progress Notes (Signed)
Patient Demographics  [name] [name], is a 75 y.o. male, DOB - 1940-04-17, ZOX:[id number]  Admit date - 01/30/2015   Admitting Physician Haydee Monica, MD  Outpatient Primary MD for the patient is [name], MD  LOS - 9   Chief Complaint  Patient presents with  . Shortness of Breath  . Coughing up blood        Admission HPI/Brief narrative: 75 year old male with PMHof  diabetes, GERD, OSA/OHS on nocturnal BiPAP, COPD , atrial fibrillation, chronic left pleural effusion, and hypertension presents to any pain with complaints of dyspnea and hemoptysis, CTA chest was significant for pulmonary embolism, transitioned to Eliiquis, developed hematemesis, currently on heparin GTT, a shunt with worsening respiratory failure, with increased oxygen demand, requiring BiPAP, as well Hospital course: Dictated by dysphagia, unstable for EGD, patient was transferred to [facility] 01/19/2015 for possible thoracentesis, seen by pulmonary who thought his pleural effusion at baseline, and his worsening respiratory failure more likely related to atelectasis/mucus plugging.  Subjective:   [name] [name] today has, No headache, No chest pain, No abdominal pain - No Nausea, still complaining soft dyspnea, patient continues to have hemoptysis . 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 - Continue with BiPAP daily at bedtime and when necessary. - as well worsened secondary atelectasis from mucous plugging and chronic pleural effusion, treated with pulmonary toilet, chest PT as per pulmonary recommendation.  Pulmonary embolism with suspected lung infarction/hemoptysis - Initially on Eliquis , changed to heparin GTT given his hematemesis, patient with significant amount of hematemesis 01/19/2015, where his heparin gtt Has been held, patient with list hemoptysis over last 24 hours, resumed on heparin GTT with no bolus this a.m., will continue to monitor closely. - not a candidate for systemic or catheter lysis - lower extremity Doppler with no evidence of DVT, no role for IVC filter.   Left hemithorax opacification on chest x-ray - Patient with known chronic left pleural effusion has been seen by Dr. Craige Cotta as outpatient. - Within evidence of worsening left hemithorax, this is due to atelectasis from mucus plugging/blood and chronic pleural effusion. - Continue with pulmonary toilet, chest PT, Mucomyst was stopped, patient started on hypertonic saline. - repeat chestx-ray this a.m. with no significant changes.  HCAP - Patient is on levofloxacin, started on vancomycin and aztreonam 01/20/2015 by pulmonary for concern HCAP, especially in the sitting of worsening leukocytosis.   Chronic atrial fibrillation - CHA2DS2-VASc greater than 3, initially on eliquis, switched to heparin GTT, currently on hold secondary to hemoptysis - heart rate controlled on diltiazem.  Dysphagia - Patient with known esophageal motility disorder,and large epiphrenic diverticulum, this could be secondary to reflux esophagitis versus foof filled  diverticulum - seen by GI dr. Kelvin Cellar at [facility]. - unstable for EGD  Elevated troponin - secondary to PE  Leukocytosis - Stress related secondary to PE and pulmonary infarction, continue with levofloxacin, remains afebrile  Diabetes  mellitus - Continue with insulin sliding scale, CBGs uncontrolled, will increase Lantus from 45 units daily to 45units twice a day  Oral thrush - continue with Diflucan  Chronic kidney disease stage III  - Appears to be at baseline  Code Status: full  Family Communication: wife at bedside  Disposition Plan: Pending further work up.   Procedures  none   Consults   Gastroenterology- Dr. Karilyn [facility]ota at [facility]     Medications  Scheduled Meds: . albuterol  3 mL Inhalation Q6H  . allopurinol  300 mg Oral Daily  . antiseptic oral rinse  7 mL Mouth Rinse q12n4p  . aztreonam  1 g Intravenous 3 times per day  . chlorhexidine  15 mL Mouth Rinse BID  . diltiazem  240 mg Oral Daily  . docusate sodium  100 mg Oral Daily  . finasteride  5 mg Oral Daily  . furosemide  40 mg Intravenous Q12H  . guaiFENesin  1,200 mg Oral BID  . insulin aspart  0-15 Units Subcutaneous TID WC  . insulin aspart  0-5 Units Subcutaneous QHS  . insulin aspart  4 Units Subcutaneous TID WC  . insulin glargine  30 Units Subcutaneous BID  . levofloxacin (LEVAQUIN) IV  750 mg Intravenous Q48H  . lidocaine  5 mL Mouth/Throat TID   And  . magic mouthwash  5 mL Oral TID  . pantoprazole (PROTONIX) IV  40 mg Intravenous Q24H  . potassium chloride  20 mEq Oral Daily  . sodium chloride HYPERTONIC  5 mL Nebulization BID  . vancomycin  1,250 mg Intravenous Q24H   Continuous Infusions: . heparin     PRN Meds:.acetaminophen, dextromethorphan-guaiFENesin, magic mouthwash, ondansetron **OR** ondansetron (ZOFRAN) IV  DVT Prophylaxis  Heparin GTT on hold.  Lab Results  Component Value Date   PLT  01/22/2015    PLATELET CLUMPS NOTED ON SMEAR, COUNT APPEARS ADEQUATE    Antibiotics   Anti-infectives    Start     Dose/Rate Route Frequency Ordered Stop   01/22/15 1400  levofloxacin (LEVAQUIN) IVPB 750 mg     750 mg 100 mL/hr over 90 Minutes Intravenous Every 48 hours 01/21/15 1401     01/21/15 1200   vancomycin (VANCOCIN) 1,250 mg in sodium chloride 0.9 % 250 mL IVPB     1,250 mg 166.7 mL/hr over 90 Minutes Intravenous Every 24 hours 01/20/15 1112     01/20/15 1115  vancomycin (VANCOCIN) 2,500 mg in sodium chloride 0.9 % 500 mL IVPB     2,500 mg 250 mL/hr over 120 Minutes Intravenous  Once 01/20/15 1112 01/20/15 1411   01/20/15 1115  aztreonam (AZACTAM) 1 g in dextrose 5 % 50 mL IVPB     1 g 100 mL/hr over 30 Minutes Intravenous 3 times per day 01/20/15 1112     01/17/15 0915  levofloxacin (LEVAQUIN) IVPB 500 mg  Status:  Discontinued     500 mg 100 mL/hr over 60 Minutes Intravenous Every 24 hours 01/17/15 0906 01/21/15 1401   01/15/15 1100  fluconazole (DIFLUCAN) tablet 100 mg     100 mg Oral Daily 01/15/15 1057 01/21/15 1155   01/13/15 0930  ciprofloxacin (CIPRO) IVPB 400 mg  Status:  Discontinued     400 mg 200 mL/hr  over 60 Minutes Intravenous Every 12 hours 01/13/15 0841 01/17/15 0906          Objective:   Filed Vitals:   01/22/15 0400 01/22/15 0531 01/22/15 0904 01/22/15 0943  BP: 123/61 115/72 109/50   Pulse: 79 77 64   Temp: 97.8 F (36.6 C)  98.1 F (36.7 C)   TempSrc:   Oral   Resp: 26 25 30    Height:      Weight: 106.641 kg (235 lb 1.6 oz)     SpO2: 95% 95% 98% 96%    Wt Readings from Last 3 Encounters:  01/22/15 106.641 kg (235 lb 1.6 oz)  12/02/13 129.729 kg (286 lb)  10/21/12 133.358 kg (294 lb)     Intake/Output Summary (Last 24 hours) at 01/22/15 1124 Last data filed at 01/22/15 0906  Gross per 24 hour  Intake    840 ml  Output   2575 ml  Net  -1735 ml     Physical Exam  Awake Alert, Oriented X 3, on nasal cannula  [place].AT,PERRAL Supple Neck,No JVD, No cervical lymphadenopathy appriciated.  Symmetrical Chest wall movement,decreased air entry in the left,no wheezing RRR,No Gallops,Rubs or new Murmurs, No Parasternal Heave +ve B.Sounds, Abd Soft, No tenderness, No organomegaly appriciated, No rebound - guarding or rigidity. No Cyanosis,  Clubbing or edema, No new Rash or bruise     Data Review   Micro Results Recent Results (from the past 240 hour(s))  Culture, Urine     Status: None   Collection Time: 12/26/2014 11:50 PM  Result Value Ref Range Status   Specimen Description URINE, CLEAN CATCH  Final   Special Requests NONE  Final   Culture   Final    MULTIPLE SPECIES PRESENT, SUGGEST RECOLLECTION Performed at [facility]    Report Status 01/15/2015 FINAL  Final  MRSA PCR Screening     Status: None   Collection Time: 01/13/15  1:09 AM  Result Value Ref Range Status   MRSA by PCR NEGATIVE NEGATIVE Final    Comment:        The GeneXpert MRSA Assay (FDA approved for NASAL specimens only), is one component of a comprehensive MRSA colonization surveillance program. It is not intended to diagnose MRSA infection nor to guide or monitor treatment for MRSA infections.     Radiology Reports Ct Angio Chest Pe W/cm &/or Wo Cm  01/13/2015  CLINICAL DATA:  Hemoptysis and shortness of breath. EXAM: CT ANGIOGRAPHY CHEST WITH CONTRAST TECHNIQUE: Multidetector CT imaging of the chest was performed using the standard protocol during bolus administration of intravenous contrast. Multiplanar CT image reconstructions and MIPs were obtained to evaluate the vascular anatomy. CONTRAST:  75mL OMNIPAQUE IOHEXOL 350 MG/ML SOLN COMPARISON:  CT angiogram dated 01/27/2007 and CT scan dated 07/01/2007 FINDINGS: There is a large pulmonary embolus in the left main pulmonary artery extending into the left lower lobe. There is focal atelectasis in slight consolidation in the left lower lobe peripherally. Again noted is chronic dilatation of the distal esophagus with food material in that area. 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 Has the patient had previous esophageal surgery? This does appear unchanged since 2009. There is chronic slight cardiomegaly.  RV/ LV ratio is normal. The right lung is clear. Pulmonary  vascularity is not increased. Very tiny right effusion. No osseous abnormality. No significant abnormality in the upper abdomen. Review of the MIP images confirms the above findings. IMPRESSION: 1. Large pulmonary embolism to the left lower  lobe. Atelectasis in the left lower lobe. 2. Chronic dilatation of the distal esophagus with what appears to be calcification in the wall of the esophagus, not significantly changed since 2009. Critical Value/emergent results were called by telephone at the time of interpretation on 01/13/2015 at 9:45 am to Casimiro Needle, RN , who verbally acknowledged these results. Electronically Signed   By: Francene Boyers M.D.   On: 01/13/2015 09:50   Dg Chest Port 1 View  01/22/2015  CLINICAL DATA:  Hypoxia. EXAM: PORTABLE CHEST 1 VIEW COMPARISON:  01/20/2015. FINDINGS: Persistent opacification of the left hemithorax with volume loss consistent with left lung atelectasis, possibly from left bronchial occlusion. Mild right base subsegmental atelectasis. No pneumothorax. No acute osseus abnormality . IMPRESSION: 1. Persistent opacification left hemithorax with volume loss consistent left lung atelectasis. This is possibly secondary to left bronchial occlusion. No significant interim change. 2.  Mild right base subsegmental atelectasis. Electronically Signed   By: Maisie Fus  Register   On: 01/22/2015 08:08   Dg Chest Port 1 View  01/20/2015  CLINICAL DATA:  Patient with shortness of breath. EXAM: PORTABLE CHEST 1 VIEW COMPARISON:  Chest radiograph 01/18/2015. FINDINGS: Monitoring leads overlie the patient. There is complete opacification of the left hemi thorax with leftward mediastinal shift. There is abrupt cut off of the left mainstem bronchus. There is small right pleural effusion with bandlike opacification within the right lower hemi thorax. No pneumothorax. IMPRESSION: Complete opacification of left hemi thorax with leftward mediastinal shift and abrupt cut off of the left mainstem bronchus.  Overall findings are worrisome for atelectatic change involving the left lung, potentially secondary to endobronchial lesion or mucous plugging within left mainstem bronchus. Superimposed infectious process within left lung is an additional consideration. Consider direct visualization of the bronchus as clinically indicated. Scattered opacities within the right lower lung likely represent atelectasis. These results will be called to the ordering clinician or representative by the Radiologist Assistant, and communication documented in the PACS or zVision Dashboard. Electronically Signed   By: Annia Belt M.D.   On: 01/20/2015 09:11   Dg Chest Port 1 View  01/18/2015  CLINICAL DATA:  Acute worsening of shortness of breath this afternoon. Followup left pleural effusion and left lower lobe atelectasis versus pneumonia. Acute left lower lobe pulmonary embolus 01/13/2015. EXAM: PORTABLE CHEST 1 VIEW COMPARISON:  01/17/2015 and earlier, including CTA chest 01/13/2015. FINDINGS: Interval near complete opacification of the left hemithorax since yesterday, with only minimal aerated lung the left apex. Cardiac silhouette enlarged, though obscured by the opacified left hemithorax. Increased pulmonary vascular congestion without overt edema. Stable mild atelectasis at the right lung base. No visible right pleural effusion. IMPRESSION: 1. Near complete opacification of the left hemithorax with consolidation now present throughout most of the left lung. As there is no significant mediastinal shift, this is likely a combination of worsening atelectasis and worsening pneumonia. 2. Stable mild right basilar atelectasis. 3. Worsening pulmonary vascular congestion without overt edema. These results will be called to the ordering clinician or representative by the Radiologist Assistant, and communication documented in the PACS or zVision Dashboard. Electronically Signed   By: Hulan Saas M.D.   On: 01/18/2015 17:32   Dg Chest  Port 1 View  01/17/2015  CLINICAL DATA:  Pneumonia. EXAM: PORTABLE CHEST 1 VIEW COMPARISON:  02-01-2015. FINDINGS: Stable cardiomegaly is noted. No pneumothorax is noted. Central pulmonary vascular congestion is again noted. Mild right basilar opacity is noted concerning for edema or subsegmental atelectasis. Stable  moderate left pleural effusion is noted with underlying atelectasis, edema or infiltrate. Bony thorax is unremarkable. IMPRESSION: Stable cardiomegaly and central pulmonary vascular congestion. Stable moderate left pleural effusion with underlying atelectasis, infiltrate or edema. Electronically Signed   By: Lupita Raider, M.D.   On: 01/17/2015 12:55   Dg Chest Portable 1 View  01/10/2015  CLINICAL DATA:  75 year old male with shortness of breath and cough EXAM: PORTABLE CHEST 1 VIEW COMPARISON:  Chest radiograph dated 06/01/2012 FINDINGS: No significant change in the previously seen left mid and lower lung field opacity. There is silhouetting of the left cardiac border and left hemidiaphragm. Multiple findings may be related to prominent pericardial fat and hiatal hernia, a pleural effusion or pneumonia is not excluded. Right lung base subsegmental atelectatic changes noted. There is no pneumothorax. The cardiac borders are silhouetted. The osseous structures appear unremarkable. IMPRESSION: No significant change in left mid to lower lung field opacity. Electronically Signed   By: Elgie Collard M.D.   On: 01/10/2015 18:58     CBC  Recent Labs Lab 01/18/15 0532 01/19/15 0500 01/20/15 0434 01/21/15 0809 01/22/15 0840  WBC 18.2* PATIENT DISCHARGED OR EXPIRED 19.4* 17.2* 17.7*  HGB 11.8* PATIENT DISCHARGED OR EXPIRED 9.3* 9.2* 9.6*  HCT 37.1* PATIENT DISCHARGED OR EXPIRED 30.3* 29.0* 29.4*  PLT 262 PATIENT DISCHARGED OR EXPIRED 269 265 PLATELET CLUMPS NOTED ON SMEAR, COUNT APPEARS ADEQUATE  MCV 96.6 PATIENT DISCHARGED OR EXPIRED 95.3 95.1 94.2  MCH 30.7 PATIENT DISCHARGED OR  EXPIRED 29.2 30.2 30.8  MCHC 31.8 PATIENT DISCHARGED OR EXPIRED 30.7 31.7 32.7  RDW 15.4 PATIENT DISCHARGED OR EXPIRED 15.2 15.6* 15.6*  LYMPHSABS 3.7 PENDING  --   --   --   MONOABS 1.4* PENDING  --   --   --   EOSABS 0.4 PENDING  --   --   --   BASOSABS 0.1 PENDING  --   --   --     Chemistries   Recent Labs Lab 01/17/15 0536 01/18/15 0532 01/21/15 0809 01/22/15 0840  NA 139 137 139 139  K 4.1 4.2 3.9 4.0  CL 98* 97* 94* 93*  CO2 35* 34* 35* 31  GLUCOSE 188* 164* 143* 211*  BUN 34* 32* 61* 54*  CREATININE 1.47* 1.61* 1.99* 2.07*  CALCIUM 8.6* 8.6* 8.2* 8.3*  AST  --  16  --   --   ALT  --  16*  --   --   ALKPHOS  --  100  --   --   BILITOT  --  0.5  --   --    ------------------------------------------------------------------------------------------------------------------ estimated creatinine clearance is 36.5 mL/min (by C-G formula based on Cr of 2.07). ------------------------------------------------------------------------------------------------------------------ No results for input(s): HGBA1C in the last 72 hours. ------------------------------------------------------------------------------------------------------------------ No results for input(s): CHOL, HDL, LDLCALC, TRIG, CHOLHDL, LDLDIRECT in the last 72 hours. ------------------------------------------------------------------------------------------------------------------ No results for input(s): TSH, T4TOTAL, T3FREE, THYROIDAB in the last 72 hours.  Invalid input(s): FREET3 ------------------------------------------------------------------------------------------------------------------ No results for input(s): VITAMINB12, FOLATE, FERRITIN, TIBC, IRON, RETICCTPCT in the last 72 hours.  Coagulation profile  Recent Labs Lab 01/18/15 0532  INR 2.02*    No results for input(s): DDIMER in the last 72 hours.  Cardiac Enzymes  Recent Labs Lab 01/17/15 1230 01/17/15 1827 01/18/15 0009  TROPONINI  0.10* 0.09* 0.11*   ------------------------------------------------------------------------------------------------------------------ Invalid input(s): POCBNP     Time Spent in minutes   30 minutes   Winston Misner M.D on 01/22/2015 at 11:24 AM  Between 7am  to 7pm - Pager - [phone]  After 7pm go to [email or web address] - password [facility]  [facility]   Office  [phone]

## 2015-01-22 NOTE — Progress Notes (Signed)
Name: [name] GrateWilliam H Spells MRN: [id number] DOB: 1940/10/28    ADMISSION DATE:  01/11/2015 CONSULTATION DATE:  1/5  REFERRING MD :  Dr. Mahala MenghiniSamtani  CHIEF COMPLAINT:  Effusion  SUBJECTIVE:  Still somewhat hypoxemic, requiring 6L [place].  Denies chest pain, dyspnea.  Still with intermittent hemoptysis but frequency is much less, now dark red per wife.   VITAL SIGNS: Temp:  [97.6 F (36.4 C)-99.1 F (37.3 C)] 98.1 F (36.7 C) (01/09 0904) Pulse Rate:  [64-79] 64 (01/09 0904) Resp:  [23-31] 30 (01/09 0904) BP: (109-123)/(50-72) 109/50 mmHg (01/09 0904) SpO2:  [94 %-98 %] 98 % (01/09 0904) Weight:  [235 lb 1.6 oz (106.641 kg)] 235 lb 1.6 oz (106.641 kg) (01/09 0400)  PHYSICAL EXAMINATION: General:frail, elderly disheveled male, NAD HEENT: NCAT, thick neck PULM: resps even non labored, absent L, few scattered rhonchi R  CV: Irreg irreg, no mgr GI: BS+, soft, nontender MSK: normal bulk and tone Neuro: A&Ox4, conversant     Recent Labs Lab 01/17/15 0536 01/18/15 0532 01/21/15 0809  NA 139 137 139  K 4.1 4.2 3.9  CL 98* 97* 94*  CO2 35* 34* 35*  BUN 34* 32* 61*  CREATININE 1.47* 1.61* 1.99*  GLUCOSE 188* 164* 143*    Recent Labs Lab 01/20/15 0434 01/21/15 0809 01/22/15 0840  HGB 9.3* 9.2* 9.6*  HCT 30.3* 29.0* 29.4*  WBC 19.4* 17.2* PENDING  PLT 269 265 PENDING   Dg Chest Port 1 View  01/22/2015  CLINICAL DATA:  Hypoxia. EXAM: PORTABLE CHEST 1 VIEW COMPARISON:  01/20/2015. FINDINGS: Persistent opacification of the left hemithorax with volume loss consistent with left lung atelectasis, possibly from left bronchial occlusion. Mild right base subsegmental atelectasis. No pneumothorax. No acute osseus abnormality . IMPRESSION: 1. Persistent opacification left hemithorax with volume loss consistent left lung atelectasis. This is possibly secondary to left bronchial occlusion. No significant interim change. 2.  Mild right base subsegmental atelectasis. Electronically Signed   By:  Maisie Fushomas  Register   On: 01/22/2015 08:08    SIGNIFICANT EVENTS  12/31  [facility]  [facility] .  STUDIES:  Echo 03/08/07 >> EF 55%, mild diastolic dysfx, mild/mod LA/RA dilation, mild MR, mild TR, PAS 25 mmHg Spirometry 06/01/12 >> FEV1 1.52 (58%), FEV1% 70; mixed obstructive/restrictive defect BiPAP 09/28/13 to 10/27/13 >> used on 30 of 30 nights with average 14 hrs and 29 min. Average AHI is 1.7 with BiPAP 12/8 cm H2O. CTA Chest 12/31>> large pulmonary embolism in the left main pulmonary artery extending into the left lower lobe. Atelectasis in the left lower lobe. Chronic dilation of distal esophagus. ECHO 1/4 >>  Moderate LVH, EF 65- 70%, no RWMA's, possible anterior pericardial effusion vs tissue density. "grossly normal RV, cannot assess RVSP" LE Doppler 1/6 >> negative  ABX:  Levaquin 1/4 >>  Vanc 1/8>>> Aztreonam 1/8>>>   CULTURES:  UC 12/30 >>    Discussion: 75 year old male with PMH of chronic L plueral effusion deemed not a surgical candidate who [facility] with hemoptysis. Found to have large left pulmonary embolism. 1/5 had increased work of breathing and increased oxygen demands. He was [facility] to [facility] for [facility] evaluation.  1/7 overnight developed severe hemoptysis, respiratory failure, and note has mediastinal shift and total left upper lobe collapse in addition to his chronic left effusion seen on CXR.   Hemoptysis now improving off heparin.  Pt and his wife have been very clear about DNR/DNI status.  ASSESSMENT / PLAN:  Left hemithorax opacification on CXR - due to atelectasis from mucus plugging/blood and chronic pleural effusion; tapping the effusion would cause re-expansion pulmonary edema and likely death as it is a chronic effusion (years) causing lung entrapment.  Acute on chronic respiratory failure secondary to pulmonary embolism on chronic OHS, OSA, and COPD Pulmonary embolism with suspected lung  infarction Hemoptysis - improving 1/9 HCAP now? Rising WBC  Plan: - no thoracentesis - no mucomyst> may have been worsening hemoptysis - resume heparin per pharmacy without bolus - monitor hemoptysis closely  - Continue hypertonic saline nebs bid - continue albuterol - BIPAP QHS and PRN - Chest PT - out of bed as able - Incentive spiro and flutter valve - Supplemental O2 to keep SpO2 88-92% - continue levaquin, vanc/aztreonam given rising WBC, possible HCAP - intermittent f/u CXR   Discussed at length with pt and wife at bedside 1/9.  Difficult situation and prognosis remains guarded.    Dirk Dress, NP 01/22/2015  9:40 AM Pager: [phone] or [phone]

## 2015-01-22 NOTE — Progress Notes (Signed)
ANTICOAGULATION + ANTIBIOTIC CONSULT NOTE - Follow Up Consult  Pharmacy Consult for Heparin, Vancomycin and Aztreonam Indication: pulmonary embolus, HCAP coverage  Allergies  Allergen Reactions  . Cefaclor     Reactions are unknown  . Cephalexin     Reactions are unknown  . Sulfamethoxazole-Trimethoprim     Reactions are unknown    Patient Measurements: Height: 5\' 8"  (172.7 cm) Weight: 235 lb 1.6 oz (106.641 kg) IBW/kg (Calculated) : 68.4 Heparin Dosing Weight: 92.5 kg  Vital Signs: Temp: 98.1 F (36.7 C) (01/09 0904) Temp Source: Oral (01/09 0904) BP: 109/50 mmHg (01/09 0904) Pulse Rate: 64 (01/09 0904)  Labs:  Recent Labs  01/20/15 0434 01/20/15 0920 01/21/15 0809 01/22/15 0840  HGB 9.3*  --  9.2* 9.6*  HCT 30.3*  --  29.0* 29.4*  PLT 269  --  265 PENDING  APTT  --  34  --   --   HEPARINUNFRC  --  1.50*  --   --   CREATININE  --   --  1.99* 2.07*    Estimated Creatinine Clearance: 36.5 mL/min (by C-G formula based on Cr of 2.07).  Assessment:   To resume IV heparin today for PE per 01/13/15 CT.  Anticoagulation has been on hold since 1/6 pm due to increased hemoptysis, which has now lessened. Heparin drip to resume without bolus.  Discussed with patient and wife.     Received Eliquis 10 mg BID 01/13/15-01/16/15, last dose 1/3 at 10pm.  Changed to IV heparin 01/17/15 am due to hemoptysis.  aPTTs were therapeutic on heparin at 1350 units/hr.  Heparin levels were high due to recent Eliquis, which skews levels. Expect Heparin levels and aPTTs to correspond now, 6 days after last Eliquis dose     Day # 3 Vancomycin and Aztreonam; day # 6 Levaquin for HCAP     Creatinine has trended up. Levaquin dose adjusted 1/8.  Vanc and Aztreonam doses remain appropriate.   Tmax 99.1, WBC 17.2 yesterday, pending today.  Goal of Therapy:  Heparin level 0.3-0.7 units/ml aPTT 66-102 seconds Monitor platelets by anticoagulation protocol: Yes  Vancomycin trough levels 15-20  mcg/ml Appropriate Aztreonam dose for renal function and infection   Plan:   Resume Heparin drip conservatively at 1200 units/hr. (Previously therapeutic on 1350 units/hr.)  Heparin level and aPTT ~6 hrs after resuming to be sure they're corresponding.  Patient/wife to inform us if any increased hemoptysis.  Daily heparin level and CBC while on heparin.  Add daily aPTT if needed.  Continue Vancomycin 1250 mg IV q24hrs.  Will consider checking trough level soon.  Continue Aztreonam 1gm IV q8hrs and Levaquin 750 mg IV q48hrs (due today).  Follow renal function, culture data, progress.  Dennie FettersEgan, Kiran Carline Donovan, RPh Pager: [phone] 01/22/2015,10:25 AM

## 2015-01-22 NOTE — Progress Notes (Signed)
PT Cancellation Note  Patient Details Name: [name] GrateWilliam H [name] MRN: [id number] DOB: May 29, 1940   Cancelled Treatment:    Reason Eval/Treat Not Completed: Medical issues which prohibited therapy. Pt is not feeling well. He is declining participation in PT today. He is on 6 L O2 via mask and appears very lethargic. Wife reports he just coughed up a large amount of blood after undergoing chest PT. PT to follow up tomorrow.   Ilda FoilGarrow, Blimi Godby Rene 01/22/2015, 10:08 AM

## 2015-01-22 NOTE — Clinical Social Work Note (Signed)
Clinical Social Work Assessment  Patient Details  Name: [name] [name] MRN: [id number] Date of Birth: December 30, 1940  Date of referral:  01/22/15               Reason for consult:  Facility Placement, Discharge Planning                Permission sought to share information with:  Facility Sport and exercise psychologist, Family Supports Permission granted to share information::  Yes, Verbal Permission Granted  Name::     Leisure centre manager::  SNFs  Relationship::     Contact Information:     Housing/Transportation Living arrangements for the past 2 months:  [place] of Information:  Patient, Spouse Patient Interpreter Needed:  None Criminal Activity/Legal Involvement Pertinent to Current Situation/Hospitalization:  No - Comment as needed Significant Relationships:  Spouse Lives with:  Spouse Do you feel safe going back to the place where you live?  Yes Need for family participation in patient care:  Yes (Comment)  Care giving concerns:  Patient's wife and patient agree that the patient will need continued therapy at discharge. Patient and wife will likely choose for patient to go to SNF since this will be closer to home, but CIR to evaluate.   Social Worker assessment / plan:  CSW met with patient and patient's wife at bedside to compete assessment. Patient from home with wife who is his main support. Per patient and wife, both are agreeable to SNF placement as alternative to CIR. The preferred facility would be [facility]. Unclear which option the patient and wife will ultimately want to pursue (SNF vs CIR). CSW explained SNF search/placement process and answered the patient's questions. CSW will follow up with available bed offers.  Employment status:  Retired Forensic scientist:  Medicare PT Recommendations:  Inpatient [place] / Referral to community resources:  [place]  Patient/Family's Response to care:  Patient and family appear happy  with the care the patient has received.  Patient/Family's Understanding of and Emotional Response to Diagnosis, Current Treatment, and Prognosis:  Patient and patient's family appear to have a good understanding of reason for admission and the patient's post DC needs.  Emotional Assessment Appearance:  Appears stated age Attitude/Demeanor/Rapport:  Other (Patient appropriate and welcoming of CSW.) Affect (typically observed):  Accepting, Appropriate, Calm, Pleasant Orientation:  Oriented to Self, Oriented to Place, Oriented to  Time, Oriented to Situation Alcohol / Substance use:  Never Used Psych involvement (Current and /or in the community):  No (Comment)  Discharge Needs  Concerns to be addressed:  Discharge Planning Concerns Readmission within the last 30 days:  No Current discharge risk:  Chronically ill, Physical Impairment Barriers to Discharge:  Continued Medical Work up   [employer] MSW, [place], [place], [phone]

## 2015-01-22 NOTE — Progress Notes (Signed)
Foley removed per MD, condom cath placed

## 2015-01-22 NOTE — Progress Notes (Addendum)
ANTICOAGULATION CONSULT NOTE - follow up  Pharmacy Consult for heparin Indication: atrial fibrillation, PE  Allergies  Allergen Reactions  . Cefaclor     Reactions are unknown  . Cephalexin     Reactions are unknown  . Sulfamethoxazole-Trimethoprim     Reactions are unknown   Patient Measurements: Height: 5\' 8"  (172.7 cm) Weight: 235 lb 1.6 oz (106.641 kg) IBW/kg (Calculated) : 68.4 Heparin Dosing Weight: 93 kg  Vital Signs: Temp: 98 F (36.7 C) (01/09 1450) Temp Source: Oral (01/09 1450) BP: 111/54 mmHg (01/09 1450) Pulse Rate: 75 (01/09 1450)  Labs:  Recent Labs  01/20/15 0434 01/20/15 0920 01/21/15 0809 01/22/15 0840 01/22/15 1806  HGB 9.3*  --  9.2* 9.6*  --   HCT 30.3*  --  29.0* 29.4*  --   PLT 269  --  265 PLATELET CLUMPS NOTED ON SMEAR, COUNT APPEARS ADEQUATE  --   APTT  --  34  --   --  50*  HEPARINUNFRC  --  1.50*  --   --  0.90*  CREATININE  --   --  1.99* 2.07*  --    Estimated Creatinine Clearance: 36.5 mL/min (by C-G formula based on Cr of 2.07).  Medical History: Past Medical History  Diagnosis Date  . Unspecified essential hypertension   . Type II or unspecified type diabetes mellitus without mention of complication, not stated as uncontrolled   . Esophageal reflux   . Gout   . Depressive disorder, not elsewhere classified   . OSA (obstructive sleep apnea)     BiPAP  . COPD (chronic obstructive pulmonary disease) (HCC)   . Respiratory failure (HCC)   . BPH (benign prostatic hyperplasia)   . Pleural effusion, left 06/14/2007        . Pulmonary hypertension (HCC)   . Cor pulmonale (HCC)     R heart failure  . Diverticulosis   . CAD (coronary artery disease)     non-obsctructive   . Obesity   . History of nuclear stress test 06/18/2006    [facility]; normal myoview with diminished oxygen sats   . CHF (congestive heart failure) (HCC)   . Chronic atrial fibrillation (HCC)    Medications:  . heparin 1,200 Units/hr (01/22/15 1145)     Assessment: 75 yo man started who was on Eliquis (last dose 1/3) and now changed to heparin due to hemoptysis- heparin was held, but restarted today.  Heparin level above goal at 0.9 units/mL. Per RN, no worsening of hemoptysis and remains about the same. No other bleeding noted.  Goal of Therapy:  Heparin level 0.3-0.7 units/ml Monitor platelets by anticoagulation protocol: Yes     Plan:  Reduce heparin drip to 1050 units/hr  Daily HL and CBC Follow for worsening hemoptysis or other s/s bleeding  Ja Pistole D. Takeysha Bonk, PharmD, BCPS Clinical Pharmacist Pager: [phone] 01/22/2015 6:57 PM

## 2015-01-22 NOTE — Progress Notes (Signed)
Pt not participating in active mouthcare. RN swabbed pt mouth, pt is asleep but arousable

## 2015-01-22 NOTE — Care Management Important Message (Signed)
Important Message  Patient Details  Name: [name] [name] Haig MRN: [id number] Date of Birth: 14-Jun-1940   Medicare Important Message Given:  Yes    Rayvon CharSTUTTS, Misha Antonini G 01/22/2015, 1:27 PMImportant Message  Patient Details  Name: [name] [name] Gabrielsen MRN: [id number] Date of Birth: 14-Jun-1940   Medicare Important Message Given:  Yes    Laura Caldas, Sula SodaINA G 01/22/2015, 1:26 PM

## 2015-01-22 NOTE — Progress Notes (Signed)
UR Completed Oshea Percival Graves-Bigelow, RN,BSN [phone]  

## 2015-01-23 ENCOUNTER — Inpatient Hospital Stay (HOSPITAL_COMMUNITY): Payer: Medicare Other

## 2015-01-23 LAB — VANCOMYCIN, TROUGH: VANCOMYCIN TR: 27 ug/mL — AB (ref 10.0–20.0)

## 2015-01-23 LAB — HEPARIN LEVEL (UNFRACTIONATED)
HEPARIN UNFRACTIONATED: 0.81 [IU]/mL — AB (ref 0.30–0.70)
HEPARIN UNFRACTIONATED: 0.88 [IU]/mL — AB (ref 0.30–0.70)
Heparin Unfractionated: 0.86 IU/mL — ABNORMAL HIGH (ref 0.30–0.70)

## 2015-01-23 LAB — CBC
HEMATOCRIT: 28.5 % — AB (ref 39.0–52.0)
Hemoglobin: 8.7 g/dL — ABNORMAL LOW (ref 13.0–17.0)
MCH: 29.4 pg (ref 26.0–34.0)
MCHC: 30.5 g/dL (ref 30.0–36.0)
MCV: 96.3 fL (ref 78.0–100.0)
PLATELETS: 265 10*3/uL (ref 150–400)
RBC: 2.96 MIL/uL — ABNORMAL LOW (ref 4.22–5.81)
RDW: 15.5 % (ref 11.5–15.5)
WBC: 17.7 10*3/uL — AB (ref 4.0–10.5)

## 2015-01-23 LAB — GLUCOSE, CAPILLARY
GLUCOSE-CAPILLARY: 125 mg/dL — AB (ref 65–99)
GLUCOSE-CAPILLARY: 207 mg/dL — AB (ref 65–99)
GLUCOSE-CAPILLARY: 215 mg/dL — AB (ref 65–99)
Glucose-Capillary: 144 mg/dL — ABNORMAL HIGH (ref 65–99)

## 2015-01-23 LAB — BASIC METABOLIC PANEL
Anion gap: 13 (ref 5–15)
BUN: 50 mg/dL — AB (ref 6–20)
CHLORIDE: 91 mmol/L — AB (ref 101–111)
CO2: 34 mmol/L — ABNORMAL HIGH (ref 22–32)
Calcium: 7.9 mg/dL — ABNORMAL LOW (ref 8.9–10.3)
Creatinine, Ser: 1.98 mg/dL — ABNORMAL HIGH (ref 0.61–1.24)
GFR calc Af Amer: 36 mL/min — ABNORMAL LOW (ref >60)
GFR calc non Af Amer: 31 mL/min — ABNORMAL LOW (ref >60)
GLUCOSE: 137 mg/dL — AB (ref 65–99)
POTASSIUM: 3.7 mmol/L (ref 3.5–5.1)
Sodium: 138 mmol/L (ref 135–145)

## 2015-01-23 LAB — APTT
APTT: 57 s — AB (ref 24–37)
aPTT: 39 seconds — ABNORMAL HIGH (ref 24–37)
aPTT: 109 seconds — ABNORMAL HIGH (ref 24–37)

## 2015-01-23 MED ORDER — BISACODYL 10 MG RE SUPP
10.0000 mg | Freq: Once | RECTAL | Status: AC
  Administered 2015-01-23: 10 mg via RECTAL
  Filled 2015-01-23: qty 1

## 2015-01-23 MED ORDER — VANCOMYCIN HCL 10 G IV SOLR
1500.0000 mg | INTRAVENOUS | Status: DC
  Administered 2015-01-24: 1500 mg via INTRAVENOUS
  Filled 2015-01-23: qty 1500

## 2015-01-23 MED ORDER — SENNOSIDES-DOCUSATE SODIUM 8.6-50 MG PO TABS
2.0000 | ORAL_TABLET | Freq: Two times a day (BID) | ORAL | Status: DC
  Administered 2015-01-23 – 2015-01-24 (×3): 2 via ORAL
  Filled 2015-01-23 (×3): qty 2

## 2015-01-23 NOTE — Progress Notes (Signed)
Pt was assessed, no rhonchus breath sounds, no increase WOB, pt HR is stable at this time and o2 saturations. Pt CPT Chest Vest was held, pt is sleeping at this time no distress noted. pt Sleeping comfortably.

## 2015-01-23 NOTE — Progress Notes (Signed)
Vanc trough 27, pharmacy notified. Dose held

## 2015-01-23 NOTE — Progress Notes (Signed)
Pt not maintaining 02 sats on Bipap. RN placed pt on 5L [place], pt sats 92%

## 2015-01-23 NOTE — Progress Notes (Signed)
Pt not at a level functionally to be able to participate in intense inpt rehab. SNF recommended and I will update SW of my recommendations. [facility]. [phone]

## 2015-01-23 NOTE — Progress Notes (Signed)
ANTICOAGULATION CONSULT NOTE   Pharmacy Consult for heparin Indication: atrial fibrillation, PE  Allergies  Allergen Reactions  . Cefaclor     Reactions are unknown  . Cephalexin     Reactions are unknown  . Sulfamethoxazole-Trimethoprim     Reactions are unknown   Patient Measurements: Height: 5\' 8"  (172.7 cm) Weight: 240 lb 8 oz (109.09 kg) IBW/kg (Calculated) : 68.4 Heparin Dosing Weight: 93 kg  Vital Signs: Temp: 97.9 F (36.6 C) (01/10 0430) Temp Source: Oral (01/10 0430) BP: 124/64 mmHg (01/10 0430) Pulse Rate: 60 (01/10 0430)  Labs:  Recent Labs  01/20/15 0920  01/21/15 0809 01/22/15 0840 01/22/15 1806 01/23/15 0335  HGB  --   < > 9.2* 9.6*  --  8.7*  HCT  --   --  29.0* 29.4*  --  28.5*  PLT  --   --  265 PLATELET CLUMPS NOTED ON SMEAR, COUNT APPEARS ADEQUATE  --  265  APTT 34  --   --   --  50* 39*  HEPARINUNFRC 1.50*  --   --   --  0.90* 0.81*  CREATININE  --   --  1.99* 2.07*  --  1.98*  < > = values in this interval not displayed. Estimated Creatinine Clearance: 38.6 mL/min (by C-G formula based on Cr of 1.98).  Assessment: 75 yo male with h/o Afib, new PE, for heparin. Heparin level remains elevated, although given ARF and the higher dose of Eliquis received for VTE treatment, as well as the Heparin level and aPTT trends, believe Eliquis is still interferring with anti-Xa level and will use aPTTs to monitor heparin.   RN reports no hemoptysis this morning.  Goal of Therapy:  aPTT 66-102 Heparin level 0.3-0.7 units/ml Monitor platelets by anticoagulation protocol: Yes   Plan:  Increase Heparin 1350 units/hr (aPTT previously therapeutic at this rate) aPTT and heparin level in 8 hrs  Geannie RisenGreg Soraya Paquette, PharmD, BCPS  01/23/2015 5:23 AM

## 2015-01-23 NOTE — Progress Notes (Signed)
Placed patient on Bipap  With IPAP set at 12cm and EPAP set at 5cm for the night with oxygen set at 4lpm.

## 2015-01-23 NOTE — Progress Notes (Signed)
Pt on Bipap, pt looks uncomfortable, but pt states he is fine. Pt sats are 87% On Bi-Pap

## 2015-01-23 NOTE — Progress Notes (Signed)
ANTICOAGULATION - Follow Up Consult  Pharmacy Consult for Heparin Indication: atrial fibrillation and pulmonary embolus  Allergies  Allergen Reactions  . Cefaclor     Reactions are unknown  . Cephalexin     Reactions are unknown  . Sulfamethoxazole-Trimethoprim     Reactions are unknown    Patient Measurements: Height: 5\' 8"  (172.7 cm) Weight: 240 lb 8 oz (109.09 kg) IBW/kg (Calculated) : 68.4 Heparin Dosing Weight: 93 kg  Vital Signs: Temp: 97.6 F (36.4 C) (01/10 1551) Temp Source: Oral (01/10 1551) BP: 101/59 mmHg (01/10 1551) Pulse Rate: 64 (01/10 1156)  Labs:  Recent Labs  01/21/15 0809 01/22/15 0840 01/22/15 1806 01/23/15 0335 01/23/15 1500  HGB 9.2* 9.6*  --  8.7*  --   HCT 29.0* 29.4*  --  28.5*  --   PLT 265 PLATELET CLUMPS NOTED ON SMEAR, COUNT APPEARS ADEQUATE  --  265  --   APTT  --   --  50* 39* 57*  HEPARINUNFRC  --   --  0.90* 0.81* 0.88*  CREATININE 1.99* 2.07*  --  1.98*  --     Estimated Creatinine Clearance: 38.6 mL/min (by C-G formula based on Cr of 1.98).  Assessment:   On heparin for PE per CT 01/13/16 and for afib.  Anticoagulation held 1/6-1/9 due to increased hemoptysis. Heparin drip resumed 1/9 when hemoptysis had lessened. No further hemoptysis reported.  Heparin level remains above goal (0.88) on 1350 units/hr, but aPTT is below goal at 57 seconds. Heparin levels and aPTTs not corresponding yet.     Received Eliquis 10 mg BID 01/13/15-01/16/15, last dose 1/3 at 10pm.  Changed to IV heparin 01/17/15 am due to hemoptysis.  aPTTs were therapeutic on heparin at 1350 units/hr.   Goal of Therapy:  Heparin level 0.3-0.7 units/ml aPTT 66-102 seconds Monitor platelets by anticoagulation protocol: Yes    Plan:   Increase heparin drip to 1500 units/hr.  Heparin level and aPTT ~ 6hrs after increase.  Daily heparin level and aPTT until they are corresponding.  Patient/wife to inform us if hemoptysis recurs.  Dennie FettersEgan, Geary Rufo Donovan, RPh Pager:  [phone] 01/23/2015,4:10 PM

## 2015-01-23 NOTE — Progress Notes (Addendum)
Patient Demographics  [name] [name], is a 75 y.o. male, DOB - January 05, 1941, ZOX:[id number]  Admit date - 12/17/2014   Admitting Physician Haydee Monica, MD  Outpatient Primary MD for the patient is [name], MD  LOS - 10   Chief Complaint  Patient presents with  . Shortness of Breath  . Coughing up blood        Admission HPI/Brief narrative: 75 year old male with PMHof  diabetes, GERD, OSA/OHS on nocturnal BiPAP, COPD , atrial fibrillation, chronic left pleural effusion, and hypertension presents to [facility] pain with complaints of dyspnea and hemoptysis, CTA chest was significant for pulmonary embolism, transitioned to Eliiquis, developed hemoptysis, initially on Eliquis , transitioned to  heparin GTT,with worsening respiratory failure, with increased oxygen demand, requiring BiPAP, heparin GTT for couple days secondary to significant hemoptysis, currently back on heparin GTT -  as well Hospital course complicated by dysphagia, unstable for EGD,  - patient was transferred to [facility] 01/19/2015 for possible thoracentesis, seen by pulmonary who thought his pleural effusion is chronic and at baseline, and thoracentesis would be contraindicated,  they think his worsening respiratory failure more likely related to atelectasis/mucus plugging.  Subjective:   [name] [name] today has, No headache, No chest pain, No abdominal pain - No Nausea, still complaining soft dyspnea, hemoptysis significantly improved over last 24 hours . 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 - Continue with BiPAP daily at bedtime and when necessary. -Respiratory failure  worsened secondary atelectasis from mucous plugging and chronic pleural effusion, treated with pulmonary toilet, chest PT as per pulmonary recommendation.  Pulmonary embolism with suspected lung infarction/hemoptysis - Initially on Eliquis , changed to heparin GTT given his hematemesis, patient with significant amount of hematemesis 01/19/2015, where his heparin gtt Has been held, patient with less hemoptysis over last 48 hours, resumed on heparin GTT with no bolus , will continue to monitor closely. - not a candidate for systemic or catheter lysis - lower extremity Doppler with no evidence of DVT, no role for IVC filter.   Left hemithorax opacification on chest x-ray - Patient with known chronic left pleural effusion has been seen by Dr. Craige Cotta as outpatient. - Within evidence of worsening left hemithorax, this is due to atelectasis from mucus plugging/blood and chronic pleural effusion. - Continue with pulmonary toilet, chest PT, Mucomyst was stopped, patient started on hypertonic saline. -No role for thoracentesis here per pulmonary   HCAP - Patientinitially  on levofloxacin, started on vancomycin and aztreonam 01/20/2015 by pulmonary for concern HCAP, especially in the sitting of worsening leukocytosis.   Chronic atrial fibrillation - CHA2DS2-VASc greater than 3 - heart rate controlled on  diltiazem.  Dysphagia - Patient with known esophageal motility disorder,and large epiphrenic diverticulum, this could be secondary to reflux esophagitis versus foof filled diverticulum - seen by GI dr. Kelvin Cellar at [facility]. - unstable for EGD - On clear  liquid diet, will advance to soft diet.  Elevated troponin - secondary to PE  Leukocytosis - Stress related secondary to PE and pulmonary infarction, continue with levofloxacin, remains afebrile  Diabetes mellitus - Continue with insulin sliding scale,  and Lantus .  Oral thrush - Treated with Diflucan, continue with Magic mouthwash  Chronic kidney disease stage III  - Appears to be at baseline  Code Status: full  Family Communication: wife at bedside  Disposition Plan: Pending further work up.   Procedures  none   Consults   Gastroenterology- Dr. Karilyn Cota at [facility]     Medications  Scheduled Meds: . albuterol  3 mL Inhalation Q6H  . allopurinol  300 mg Oral Daily  . antiseptic oral rinse  7 mL Mouth Rinse q12n4p  . aztreonam  1 g Intravenous 3 times per day  . chlorhexidine  15 mL Mouth Rinse BID  . diltiazem  240 mg Oral Daily  . docusate sodium  100 mg Oral Daily  . finasteride  5 mg Oral Daily  . furosemide  40 mg Intravenous Q12H  . guaiFENesin  1,200 mg Oral BID  . insulin aspart  0-15 Units Subcutaneous TID WC  . insulin aspart  0-5 Units Subcutaneous QHS  . insulin aspart  4 Units Subcutaneous TID WC  . insulin glargine  30 Units Subcutaneous BID  . levofloxacin (LEVAQUIN) IV  750 mg Intravenous Q48H  . lidocaine  5 mL Mouth/Throat TID   And  . magic mouthwash  5 mL Oral TID  . pantoprazole (PROTONIX) IV  40 mg Intravenous Q24H  . potassium chloride  20 mEq Oral Daily  . [START ON 01/24/2015] vancomycin  1,500 mg Intravenous Q48H   Continuous Infusions: . heparin 1,350 Units/hr (01/23/15 0601)   PRN Meds:.acetaminophen, dextromethorphan-guaiFENesin, magic mouthwash, ondansetron **OR** ondansetron (ZOFRAN) IV  DVT Prophylaxis  Heparin GTT on hold.  Lab Results  Component Value Date   PLT 265 01/23/2015    Antibiotics   Anti-infectives    Start     Dose/Rate Route Frequency Ordered Stop   01/24/15 0600  vancomycin (VANCOCIN)  1,500 mg in sodium chloride 0.9 % 500 mL IVPB     1,500 mg 250 mL/hr over 120 Minutes Intravenous Every 48 hours 01/23/15 1229     01/22/15 1400  levofloxacin (LEVAQUIN) IVPB 750 mg     750 mg 100 mL/hr over 90 Minutes Intravenous Every 48 hours 01/21/15 1401     01/21/15 1200  vancomycin (VANCOCIN) 1,250 mg in sodium chloride 0.9 % 250 mL IVPB  Status:  Discontinued     1,250 mg 166.7 mL/hr over 90 Minutes Intravenous Every 24 hours 01/20/15 1112 01/23/15 1229   01/20/15 1115  vancomycin (VANCOCIN) 2,500 mg in sodium chloride 0.9 % 500 mL IVPB     2,500 mg 250 mL/hr over 120 Minutes Intravenous  Once 01/20/15 1112 01/20/15 1411   01/20/15 1115  aztreonam (AZACTAM) 1 g in dextrose 5 % 50 mL IVPB     1 g 100 mL/hr over 30 Minutes Intravenous 3 times per day 01/20/15 1112     01/17/15 0915  levofloxacin (LEVAQUIN) IVPB 500 mg  Status:  Discontinued     500 mg 100 mL/hr over 60 Minutes Intravenous  Every 24 hours 01/17/15 0906 01/21/15 1401   01/15/15 1100  fluconazole (DIFLUCAN) tablet 100 mg     100 mg Oral Daily 01/15/15 1057 01/21/15 1155   01/13/15 0930  ciprofloxacin (CIPRO) IVPB 400 mg  Status:  Discontinued     400 mg 200 mL/hr over 60 Minutes Intravenous Every 12 hours 01/13/15 0841 01/17/15 0906          Objective:   Filed Vitals:   01/23/15 0747 01/23/15 0909 01/23/15 0954 01/23/15 1156  BP: 133/70   112/58  Pulse: 66  59 64  Temp: 98.2 F (36.8 C)   98.7 F (37.1 C)  TempSrc: Oral   Oral  Resp: 24  27 21   Height:      Weight:      SpO2: 96% 96% 96% 94%    Wt Readings from Last 3 Encounters:  01/23/15 109.09 kg (240 lb 8 oz)  12/02/13 129.729 kg (286 lb)  10/21/12 133.358 kg (294 lb)     Intake/Output Summary (Last 24 hours) at 01/23/15 1356 Last data filed at 01/23/15 0849  Gross per 24 hour  Intake    400 ml  Output   1625 ml  Net  -1225 ml     Physical Exam  Awake Alert, Oriented X 3, on nasal cannula  [place].AT,PERRAL Supple Neck,No JVD, No  cervical lymphadenopathy appriciated.  Symmetrical Chest wall movement,decreased air entry in the left,no wheezing RRR,No Gallops,Rubs or new Murmurs, No Parasternal Heave +ve B.Sounds, Abd Soft, No tenderness, No organomegaly appriciated, No rebound - guarding or rigidity. No Cyanosis, Clubbing or edema, No new Rash or bruise     Data Review   Micro Results No results found for this or any previous visit (from the past 240 hour(s)).  Radiology Reports Ct Angio Chest Pe W/cm &/or Wo Cm  01/13/2015  CLINICAL DATA:  Hemoptysis and shortness of breath. EXAM: CT ANGIOGRAPHY CHEST WITH CONTRAST TECHNIQUE: Multidetector CT imaging of the chest was performed using the standard protocol during bolus administration of intravenous contrast. Multiplanar CT image reconstructions and MIPs were obtained to evaluate the vascular anatomy. CONTRAST:  75mL OMNIPAQUE IOHEXOL 350 MG/ML SOLN COMPARISON:  CT angiogram dated 01/27/2007 and CT scan dated 07/01/2007 FINDINGS: There is a large pulmonary embolus in the left main pulmonary artery extending into the left lower lobe. There is focal atelectasis in slight consolidation in the left lower lobe peripherally. Again noted is chronic dilatation of the distal esophagus with food material in that area. 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 Has the patient had previous esophageal surgery? This does appear unchanged since 2009. There is chronic slight cardiomegaly.  RV/ LV ratio is normal. The right lung is clear. Pulmonary vascularity is not increased. Very tiny right effusion. No osseous abnormality. No significant abnormality in the upper abdomen. Review of the MIP images confirms the above findings. IMPRESSION: 1. Large pulmonary embolism to the left lower lobe. Atelectasis in the left lower lobe. 2. Chronic dilatation of the distal esophagus with what appears to be calcification in the wall of the esophagus, not significantly changed since 2009.  Critical Value/emergent results were called by telephone at the time of interpretation on 01/13/2015 at 9:45 am to Casimiro NeedleMichael, RN , who verbally acknowledged these results. Electronically Signed   By: Francene BoyersJames  Maxwell M.D.   On: 01/13/2015 09:50   Dg Chest Port 1 View  01/23/2015  CLINICAL DATA:  Dyspnea EXAM: PORTABLE CHEST 1 VIEW COMPARISON:  01/22/2015 FINDINGS: Cardiomegaly again  noted. Central mild vascular congestion without convincing pulmonary edema. Slight improvement in aeration in left upper lobe. Again noted dense atelectasis or infiltrate in left lower lobe. Question small left pleural effusion. Stable right basilar atelectasis. IMPRESSION: Central mild vascular congestion without convincing pulmonary edema. Slight improvement in aeration in left upper lobe. Again noted dense atelectasis or infiltrate in left lower lobe. Question small left pleural effusion. Electronically Signed   By: Natasha Mead M.D.   On: 01/23/2015 09:10   Dg Chest Port 1 View  01/22/2015  CLINICAL DATA:  Hypoxia. EXAM: PORTABLE CHEST 1 VIEW COMPARISON:  01/20/2015. FINDINGS: Persistent opacification of the left hemithorax with volume loss consistent with left lung atelectasis, possibly from left bronchial occlusion. Mild right base subsegmental atelectasis. No pneumothorax. No acute osseus abnormality . IMPRESSION: 1. Persistent opacification left hemithorax with volume loss consistent left lung atelectasis. This is possibly secondary to left bronchial occlusion. No significant interim change. 2.  Mild right base subsegmental atelectasis. Electronically Signed   By: Maisie Fus  Register   On: 01/22/2015 08:08   Dg Chest Port 1 View  01/20/2015  CLINICAL DATA:  Patient with shortness of breath. EXAM: PORTABLE CHEST 1 VIEW COMPARISON:  Chest radiograph 01/18/2015. FINDINGS: Monitoring leads overlie the patient. There is complete opacification of the left hemi thorax with leftward mediastinal shift. There is abrupt cut off of the left  mainstem bronchus. There is small right pleural effusion with bandlike opacification within the right lower hemi thorax. No pneumothorax. IMPRESSION: Complete opacification of left hemi thorax with leftward mediastinal shift and abrupt cut off of the left mainstem bronchus. Overall findings are worrisome for atelectatic change involving the left lung, potentially secondary to endobronchial lesion or mucous plugging within left mainstem bronchus. Superimposed infectious process within left lung is an additional consideration. Consider direct visualization of the bronchus as clinically indicated. Scattered opacities within the right lower lung likely represent atelectasis. These results will be called to the ordering clinician or representative by the Radiologist Assistant, and communication documented in the PACS or zVision Dashboard. Electronically Signed   By: Annia Belt M.D.   On: 01/20/2015 09:11   Dg Chest Port 1 View  01/18/2015  CLINICAL DATA:  Acute worsening of shortness of breath this afternoon. Followup left pleural effusion and left lower lobe atelectasis versus pneumonia. Acute left lower lobe pulmonary embolus 01/13/2015. EXAM: PORTABLE CHEST 1 VIEW COMPARISON:  01/17/2015 and earlier, including CTA chest 01/13/2015. FINDINGS: Interval near complete opacification of the left hemithorax since yesterday, with only minimal aerated lung the left apex. Cardiac silhouette enlarged, though obscured by the opacified left hemithorax. Increased pulmonary vascular congestion without overt edema. Stable mild atelectasis at the right lung base. No visible right pleural effusion. IMPRESSION: 1. Near complete opacification of the left hemithorax with consolidation now present throughout most of the left lung. As there is no significant mediastinal shift, this is likely a combination of worsening atelectasis and worsening pneumonia. 2. Stable mild right basilar atelectasis. 3. Worsening pulmonary vascular congestion  without overt edema. These results will be called to the ordering clinician or representative by the Radiologist Assistant, and communication documented in the PACS or zVision Dashboard. Electronically Signed   By: Hulan Saas M.D.   On: 01/18/2015 17:32   Dg Chest Port 1 View  01/17/2015  CLINICAL DATA:  Pneumonia. EXAM: PORTABLE CHEST 1 VIEW COMPARISON:  January 12, 2015. FINDINGS: Stable cardiomegaly is noted. No pneumothorax is noted. Central pulmonary vascular congestion is again noted. Mild right  basilar opacity is noted concerning for edema or subsegmental atelectasis. Stable moderate left pleural effusion is noted with underlying atelectasis, edema or infiltrate. Bony thorax is unremarkable. IMPRESSION: Stable cardiomegaly and central pulmonary vascular congestion. Stable moderate left pleural effusion with underlying atelectasis, infiltrate or edema. Electronically Signed   By: Lupita Raider, M.D.   On: 01/17/2015 12:55   Dg Chest Portable 1 View  2015/01/14  CLINICAL DATA:  75 year old male with shortness of breath and cough EXAM: PORTABLE CHEST 1 VIEW COMPARISON:  Chest radiograph dated 06/01/2012 FINDINGS: No significant change in the previously seen left mid and lower lung field opacity. There is silhouetting of the left cardiac border and left hemidiaphragm. Multiple findings may be related to prominent pericardial fat and hiatal hernia, a pleural effusion or pneumonia is not excluded. Right lung base subsegmental atelectatic changes noted. There is no pneumothorax. The cardiac borders are silhouetted. The osseous structures appear unremarkable. IMPRESSION: No significant change in left mid to lower lung field opacity. Electronically Signed   By: Elgie Collard M.D.   On: 2015/01/14 18:58     CBC  Recent Labs Lab 01/18/15 0532 01/19/15 0500 01/20/15 0434 01/21/15 0809 01/22/15 0840 01/23/15 0335  WBC 18.2* PATIENT DISCHARGED OR EXPIRED 19.4* 17.2* 17.7* 17.7*  HGB 11.8*  PATIENT DISCHARGED OR EXPIRED 9.3* 9.2* 9.6* 8.7*  HCT 37.1* PATIENT DISCHARGED OR EXPIRED 30.3* 29.0* 29.4* 28.5*  PLT 262 PATIENT DISCHARGED OR EXPIRED 269 265 PLATELET CLUMPS NOTED ON SMEAR, COUNT APPEARS ADEQUATE 265  MCV 96.6 PATIENT DISCHARGED OR EXPIRED 95.3 95.1 94.2 96.3  MCH 30.7 PATIENT DISCHARGED OR EXPIRED 29.2 30.2 30.8 29.4  MCHC 31.8 PATIENT DISCHARGED OR EXPIRED 30.7 31.7 32.7 30.5  RDW 15.4 PATIENT DISCHARGED OR EXPIRED 15.2 15.6* 15.6* 15.5  LYMPHSABS 3.7 PENDING  --   --   --   --   MONOABS 1.4* PENDING  --   --   --   --   EOSABS 0.4 PENDING  --   --   --   --   BASOSABS 0.1 PENDING  --   --   --   --     Chemistries   Recent Labs Lab 01/17/15 0536 01/18/15 0532 01/21/15 0809 01/22/15 0840 01/23/15 0335  NA 139 137 139 139 138  K 4.1 4.2 3.9 4.0 3.7  CL 98* 97* 94* 93* 91*  CO2 35* 34* 35* 31 34*  GLUCOSE 188* 164* 143* 211* 137*  BUN 34* 32* 61* 54* 50*  CREATININE 1.47* 1.61* 1.99* 2.07* 1.98*  CALCIUM 8.6* 8.6* 8.2* 8.3* 7.9*  AST  --  16  --   --   --   ALT  --  16*  --   --   --   ALKPHOS  --  100  --   --   --   BILITOT  --  0.5  --   --   --    ------------------------------------------------------------------------------------------------------------------ estimated creatinine clearance is 38.6 mL/min (by C-G formula based on Cr of 1.98). ------------------------------------------------------------------------------------------------------------------ No results for input(s): HGBA1C in the last 72 hours. ------------------------------------------------------------------------------------------------------------------ No results for input(s): CHOL, HDL, LDLCALC, TRIG, CHOLHDL, LDLDIRECT in the last 72 hours. ------------------------------------------------------------------------------------------------------------------ No results for input(s): TSH, T4TOTAL, T3FREE, THYROIDAB in the last 72 hours.  Invalid input(s):  FREET3 ------------------------------------------------------------------------------------------------------------------ No results for input(s): VITAMINB12, FOLATE, FERRITIN, TIBC, IRON, RETICCTPCT in the last 72 hours.  Coagulation profile  Recent Labs Lab 01/18/15 0532  INR 2.02*    No results for input(s):  DDIMER in the last 72 hours.  Cardiac Enzymes  Recent Labs Lab 01/17/15 1230 01/17/15 1827 01/18/15 0009  TROPONINI 0.10* 0.09* 0.11*   ------------------------------------------------------------------------------------------------------------------ Invalid input(s): POCBNP     Time Spent in minutes   30 minutes   Sanayah Munro M.D on 01/23/2015 at 1:56 PM  Between 7am to 7pm - Pager - [phone]  After 7pm go to [email or web address] - password [facility]  [facility]   Office  [phone]

## 2015-01-23 NOTE — Progress Notes (Signed)
Physical Therapy Treatment Patient Details Name: JVION TURGEON MRN: [id number] DOB: [birth year] Today's Date: 01/23/2015    History of Present Illness Adm to [facility] 01/12/16 coughing up blood. Later transferred to [facility] for ?thoracentesis. +PE and put on heparin. PMHx- COPD, obstructive sleep apnea, chronic atrial fibrillation, cor pulmonale, and pulmonary hypertension.    PT Comments    Mr. Locklin made modest progress today, although requiring mod +2 assist for safe transfer as pt demonstrates flexed posture.  Pt will benefit from continued skilled PT services to increase functional independence and safety.   Follow Up Recommendations  SNF;Supervision for mobility/OOB (denied by CIR)     Equipment Recommendations  Other (comment) (TBD at next venue of care)    Recommendations for Other Services OT consult;Rehab consult     Precautions / Restrictions Precautions Precautions: Other (comment) (coughing up blood) Restrictions Weight Bearing Restrictions: No    Mobility  Bed Mobility Overal bed mobility: Needs Assistance Bed Mobility: Supine to Sit     Supine to sit: Min assist;HOB elevated     General bed mobility comments: Cues for technique. Pt slow to push up to sitting and definite use of rail  Transfers Overall transfer level: Needs assistance Equipment used: Rolling walker (2 wheeled) Transfers: Sit to/from [employer] Sit to Stand: Min assist;From elevated surface Stand pivot transfers: Mod assist;+2 safety/equipment;+2 physical assistance       General transfer comment: Assist to boost up to standing and to manage RW during stand pivot and to direct hips when sitting.  Pt w/ flexed posture despite verbal and tactile cues to correct.  Ambulation/Gait                 Stairs            Wheelchair Mobility    Modified Rankin (Stroke Patients Only)       Balance Overall balance assessment: Needs assistance Sitting-balance  support: Feet supported;Bilateral upper extremity supported Sitting balance-Leahy Scale: Fair     Standing balance support: Bilateral upper extremity supported;During functional activity Standing balance-Leahy Scale: Poor                      Cognition Arousal/Alertness: Awake/alert Behavior During Therapy: Flat affect Overall Cognitive Status: Within Functional Limits for tasks assessed                      Exercises General Exercises - Lower Extremity Ankle Circles/Pumps: AROM;Both;10 reps;Supine Long Arc Quad: AROM;Both;10 reps;Seated Straight Leg Raises: AROM;Both;5 reps;Supine Hip Flexion/Marching: AROM;Both;10 reps;Seated    General Comments        Pertinent Vitals/Pain Pain Assessment: No/denies pain    Home Living                      Prior Function            PT Goals (current goals can now be found in the care plan section) Acute Rehab PT Goals Patient Stated Goal: none stated PT Goal Formulation: With patient/family Time For Goal Achievement: 02/02/15 Potential to Achieve Goals: Fair Progress towards PT goals: Progressing toward goals    Frequency  Min 3X/week    PT Plan Current plan remains appropriate    Co-evaluation             End of Session Equipment Utilized During Treatment: Oxygen;Gait belt Activity Tolerance: Patient limited by fatigue Patient left: with call bell/phone within reach;in chair  Time: [id number] PT Time Calculation (min) (ACUTE ONLY): 30 min  Charges:  $Therapeutic Exercise: 8-22 mins $Therapeutic Activity: 8-22 mins                    G Codes:      Michail JewelsAshley Parr PT, [place]DPT [phone] Pager: [phone] 01/23/2015, 2:58 PM

## 2015-01-23 NOTE — Progress Notes (Signed)
Pt poorly tolerated CPT.  Pt states he can't breath, coarse crackles noted.  Pt requests BiPAP which is at bedside.  Pt placed on BiPAP 12/5 with 3LPM Bled-in.  RN and Dr. Randol KernElgergawy made aware and in to assess pt.

## 2015-01-23 NOTE — Progress Notes (Signed)
Pt given soap suds enema, pt did not tolerate well. Small BM. Pt also lost tooth that was loose prior to admission.

## 2015-01-23 NOTE — Progress Notes (Signed)
Pharmacy Antibiotic Follow-up Note  [name] [name] is a 75 y.o. year-old male admitted on 12/15/2014.  The patient is currently on day #4 Vanc and Aztreonam and # 7 Levaquin for HCAP coverage.  Assessment/Plan:  Vanc trough today is supratherapeutic at 27 mcg/ml on 1500 mg IV q24hrs.  Today's vanc dose has been held.  Adjust Vanc regimen to 1500 mg IV q48hrs, to begin 1/11 am.  Continue Aztreonam 1 gm IV q8hrs.  Continue Levaquin 750 mg IV q48hrs.  Will follow renal function, antibiotic plans.  Temp (24hrs), Avg:98.1 F (36.7 C), Min:97.9 F (36.6 C), Max:98.7 F (37.1 C)   Recent Labs Lab 01/19/15 0500 01/20/15 0434 01/21/15 0809 01/22/15 0840 01/23/15 0335  WBC PATIENT DISCHARGED OR EXPIRED 19.4* 17.2* 17.7* 17.7*    Recent Labs Lab 01/17/15 0536 01/18/15 0532 01/21/15 0809 01/22/15 0840 01/23/15 0335  CREATININE 1.47* 1.61* 1.99* 2.07* 1.98*   Estimated Creatinine Clearance: 38.6 mL/min (by C-G formula based on Cr of 1.98).    Allergies  Allergen Reactions  . Cefaclor     Reactions are unknown  . Cephalexin     Reactions are unknown  . Sulfamethoxazole-Trimethoprim     Reactions are unknown    Antimicrobials this admission: Cipro 12/31>>1/4 Levaquin 1/4 >>  Vanc 1/7 >>  Aztreonam 1/7>> Fluconazole 1/2>>1/8  Levels/dose changes this admission:  1/10: Vanc trough level 27 mcg/ml on Vanc 1250 mg IV q24hrs    - regimen adjusted to 1500 mg IV q48hrs.  Microbiology results: 12/30 urine - multiple species 12/31 MRSA PCR: neg  Dennie Fettersgan, Deatrice Spanbauer Donovan, [place] Pager: [phone] 01/23/2015 12:29 PM

## 2015-01-23 NOTE — Progress Notes (Signed)
Name: [name] [name] MRN: [id number] DOB: 03-26-1940    ADMISSION DATE:  12/28/2014 CONSULTATION DATE:  1/5  REFERRING MD :  Dr. Mahala Menghini  CHIEF COMPLAINT:  Effusion  SIGNIFICANT EVENTS  12/31  Admit to [facility] 01/05  Transfer [facility].  STUDIES:  Echo 03/08/07: EF 55%, mild diastolic dysfx, mild/mod LA/RA dilation, mild MR, mild TR, PAS 25 mmHg Spirometry 06/01/12: FEV1 1.52 (58%), FEV1% 70; mixed obstructive/restrictive defect BiPAP 09/28/13 to 10/27/13: used on 30 of 30 nights with average 14 hrs and 29 min. Average AHI is 1.7 with BiPAP 12/8 cm H2O. CTA Chest 12/31: large pulmonary embolism in the left main pulmonary artery extending into the left lower lobe. Atelectasis in the left lower lobe. Chronic dilation of distal esophagus. ECHO 1/4:  Moderate LVH, EF 65- 70%, no RWMA's, possible anterior pericardial effusion vs tissue density. "grossly normal RV, cannot assess RVSP" LE Doppler 1/6: negative Port CXR 1/10:  Persistent left lung opacification with minimal clearing.  ABX:  Levaquin 1/4 >>  Vancomycin 1/8>>> Aztreonam 1/8>>>  CULTURES:  Sputum>>Ordered  SUBJECTIVE:  Patient intolerant of CPT per RT notes. He reports this morning he had significant dyspnea after a 1 hour session of chest physiotherapy with vest. Continuing to have cough productive of all bloody phlegm. Denies any subjective fever, chills, or sweats. Tolerating diet.  REVIEW OF SYSTEMS: No chest pain or pressure. No nausea or vomiting.  VITAL SIGNS: Temp:  [97.9 F (36.6 C)-98.2 F (36.8 C)] 98.2 F (36.8 C) (01/10 0747) Pulse Rate:  [60-75] 66 (01/10 0747) Resp:  [24-30] 24 (01/10 0747) BP: (108-133)/(54-70) 133/70 mmHg (01/10 0747) SpO2:  [93 %-99 %] 96 % (01/10 0909) Weight:  [240 lb 8 oz (109.09 kg)] 240 lb 8 oz (109.09 kg) (01/10 0430)  PHYSICAL EXAMINATION: General:  Awake. Alert. No acute distress. Sitting in chair watching TV with a plate of french fries.  Integument:  Warm & dry. No  rash on exposed skin.  HEENT:  Moist mucus membranes. No oral ulcers. No scleral injection or icterus. Cardiovascular:  Regular rate. No appreciable JVD. Normal S1 & S2. Pulmonary:  Slightly decreased aeration left lung base. Symmetric chest wall expansion. No accessory muscle use on nasal cannula oxygen. Abdomen: Soft. Normal bowel sounds. Protuberant.   Recent Labs Lab 01/21/15 0809 01/22/15 0840 01/23/15 0335  NA 139 139 138  K 3.9 4.0 3.7  CL 94* 93* 91*  CO2 35* 31 34*  BUN 61* 54* 50*  CREATININE 1.99* 2.07* 1.98*  GLUCOSE 143* 211* 137*    Recent Labs Lab 01/21/15 0809 01/22/15 0840 01/23/15 0335  HGB 9.2* 9.6* 8.7*  HCT 29.0* 29.4* 28.5*  WBC 17.2* 17.7* 17.7*  PLT 265 PLATELET CLUMPS NOTED ON SMEAR, COUNT APPEARS ADEQUATE 265   Dg Chest Port 1 View  01/23/2015  CLINICAL DATA:  Dyspnea EXAM: PORTABLE CHEST 1 VIEW COMPARISON:  01/22/2015 FINDINGS: Cardiomegaly again noted. Central mild vascular congestion without convincing pulmonary edema. Slight improvement in aeration in left upper lobe. Again noted dense atelectasis or infiltrate in left lower lobe. Question small left pleural effusion. Stable right basilar atelectasis. IMPRESSION: Central mild vascular congestion without convincing pulmonary edema. Slight improvement in aeration in left upper lobe. Again noted dense atelectasis or infiltrate in left lower lobe. Question small left pleural effusion. Electronically Signed   By: Natasha Mead M.D.   On: 01/23/2015 09:10   Dg Chest Port 1 View  01/22/2015  CLINICAL DATA:  Hypoxia. EXAM: PORTABLE CHEST 1 VIEW  COMPARISON:  01/20/2015. FINDINGS: Persistent opacification of the left hemithorax with volume loss consistent with left lung atelectasis, possibly from left bronchial occlusion. Mild right base subsegmental atelectasis. No pneumothorax. No acute osseus abnormality . IMPRESSION: 1. Persistent opacification left hemithorax with volume loss consistent left lung atelectasis.  This is possibly secondary to left bronchial occlusion. No significant interim change. 2.  Mild right base subsegmental atelectasis. Electronically Signed   By: Maisie Fushomas  Register   On: 01/22/2015 08:08    ASSESSMENT / PLAN:  75 year old male with acute left pulmonary embolus with lung infarction and now with mucus plugging. Symptomatically improving. Hemoptysis improving slightly. Question possible HCAP. Patient seems to be clinically improving with his current course of therapy and is tolerating systemic anticoagulation with heparin. He's had no further evidence of acute hemoptysis with bright red blood. I suspect that he has transient desaturation this morning was likely due to some element of mucus plugging. I did encourage him to continue use of his incentive spirometer which is poor at best.  1. Hemoptysis: Resolving. Likely secondary to acute PE & lung infarction. Continuing nebs & close monitoring. 2. Acute Pulmonary Embolus with Lung Infarction: Tolerating heparin drip per pharmacy protocol. 3. HCAP: Awaiting sputum culture. Continue chest PT. Patient counseled on technique with IS again today. Continuing broad spectrum antibiotics. Consider discontinuing Levaquin tomorrow. 4. Chronic Left Pleural Effusion: No thoracentesis at this time. 5. OSA: Continue home BiPAP.  Donna ChristenJennings E. Jamison NeighborNestor, M.D. [facility] Pager:  [phone] After 3pm or if no response, call [phone]  01/23/2015  9:27 AM

## 2015-01-23 NOTE — NC FL2 (Signed)
[place] MEDICAID FL2 LEVEL OF CARE SCREENING TOOL     IDENTIFICATION  Patient Name: [name] [name] Birthdate: 1940-12-25 Sex: male Admission Date (Current Location): 02/04/2015  [facility] and [place] Number:  Producer, television/film/video and Address:  [facility] [place], [address], [place], [place] [address]      Provider Number: [id number]  Attending Physician Name and Address:  Starleen Arms, MD  Relative Name and Phone Number:       Current Level of Care: Hospital Recommended Level of Care: Skilled Nursing Facility Prior Approval Number:    Date Approved/Denied:   PASRR Number: [id number]  Discharge Plan: SNF    Current Diagnoses: Patient Active Problem List   Diagnosis Date Noted  . HCAP (healthcare-associated pneumonia)   . PNA (pneumonia)   . Elevated troponin 01/13/2015  . Acute kidney injury (HCC) 01/13/2015  . Acute pulmonary embolism (HCC) 01/13/2015  . Pulmonary embolus (HCC) 01/13/2015  . Hemoptysis 02/04/15  . Permanent atrial fibrillation (HCC) 09/13/2012  . Pulmonary hypertension, secondary (HCC) 09/13/2012  . 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 Dyspnea 06/01/2012  . Obesity hypoventilation syndrome (HCC) 06/14/2007  . COPD with chronic bronchitis (HCC) 06/14/2007  . Pleural effusion, left 06/14/2007  . RESPIRATORY FAILURE, CHRONIC 05/19/2007  . Obstructive sleep apnea 03/19/2007    Orientation RESPIRATION BLADDER Height & Weight    Self, Time, Situation, Place  O2 (3L) Incontinent 5\' 8"  (172.7 cm) 240 lbs.  BEHAVIORAL SYMPTOMS/MOOD NEUROLOGICAL BOWEL NUTRITION STATUS   (NONE)  (NONE) Continent Diet (Currently clear liquid. Will update.)  AMBULATORY STATUS COMMUNICATION OF NEEDS Skin   Extensive Assist Verbally Normal                       Personal Care Assistance Level of Assistance  Bathing, Dressing Bathing Assistance: Limited assistance   Dressing Assistance: Limited  assistance     Functional Limitations Info  Sight, Speech, Hearing Sight Info: Adequate Hearing Info: Adequate Speech Info: Adequate    SPECIAL CARE FACTORS FREQUENCY  PT (By licensed PT), OT (By licensed OT)     PT Frequency: 5/week OT Frequency: 5/week            Contractures Contractures Info: Not present    Additional Factors Info  Allergies, Code Status, Insulin Sliding Scale Code Status Info: DNR Allergies Info: Cefaclor, Cephalexin, Sulfamethoxazole-trimethoprim   Insulin Sliding Scale Info: 3X/day       Current Medications (01/23/2015):  This is the current hospital active medication list Current Facility-Administered Medications  Medication Dose Route Frequency Provider Last Rate Last Dose  . acetaminophen (TYLENOL) tablet 650 mg  650 mg Oral Q6H PRN Gerome Apley [place], PA-C   650 mg at 01/16/15 0845  . albuterol (PROVENTIL) (2.5 MG/3ML) 0.083% nebulizer solution 3 mL  3 mL Inhalation Q6H Jeanella Craze, NP   3 mL at 01/23/15 0103  . allopurinol (ZYLOPRIM) tablet 300 mg  300 mg Oral Daily Haydee Monica, MD   300 mg at 01/22/15 1037  . antiseptic oral rinse (CPC / CETYLPYRIDINIUM CHLORIDE 0.05%) solution 7 mL  7 mL Mouth Rinse q12n4p Rhetta Mura, MD   7 mL at 01/22/15 1217  . aztreonam (AZACTAM) 1 g in dextrose 5 % 50 mL IVPB  1 g Intravenous 3 times per day Gardner Candle, RPH   1 g at 01/23/15 0526  . chlorhexidine (PERIDEX) 0.12 % solution 15 mL  15 mL  Mouth Rinse BID Rhetta MuraJai-Gurmukh Samtani, MD   15 mL at 01/22/15 2205  . dextromethorphan-guaiFENesin (MUCINEX DM) 30-600 MG per 12 hr tablet 1 tablet  1 tablet Oral BID PRN Haydee Monicaachal A David, MD   1 tablet at 01/14/15 1837  . diltiazem (CARDIZEM CD) 24 hr capsule 240 mg  240 mg Oral Daily Rhetta MuraJai-Gurmukh Samtani, MD   240 mg at 01/22/15 1038  . docusate sodium (COLACE) capsule 100 mg  100 mg Oral Daily Haydee Monicaachal A David, MD   100 mg at 01/22/15 1037  . finasteride (PROSCAR) tablet 5 mg  5 mg Oral Daily Haydee Monicaachal A David, MD    5 mg at 01/22/15 1037  . furosemide (LASIX) injection 40 mg  40 mg Intravenous Q12H Rhetta MuraJai-Gurmukh Samtani, MD   40 mg at 01/23/15 0526  . guaiFENesin (MUCINEX) 12 hr tablet 1,200 mg  1,200 mg Oral BID Lupita Leashouglas B McQuaid, MD   1,200 mg at 01/22/15 2157  . heparin ADULT infusion 100 units/mL (25000 units/250 mL)  1,350 Units/hr Intravenous Continuous Starleen Armsawood S Guenther Dunshee, MD 13.5 mL/hr at 01/23/15 0601 1,350 Units/hr at 01/23/15 0601  . insulin aspart (novoLOG) injection 0-15 Units  0-15 Units Subcutaneous TID WC Estela Isaiah BlakesY Hernandez Acosta, MD   5 Units at 01/22/15 1747  . insulin aspart (novoLOG) injection 0-5 Units  0-5 Units Subcutaneous QHS Henderson CloudEstela Y Hernandez Acosta, MD   2 Units at 01/18/15 2200  . insulin aspart (novoLOG) injection 4 Units  4 Units Subcutaneous TID WC Estela Isaiah BlakesY Hernandez Acosta, MD   4 Units at 01/22/15 1217  . insulin glargine (LANTUS) injection 30 Units  30 Units Subcutaneous BID Starleen Armsawood S Cira Deyoe, MD   30 Units at 01/22/15 2240  . levofloxacin (LEVAQUIN) IVPB 750 mg  750 mg Intravenous Q48H Jessica C Carney, RPH   750 mg at 01/22/15 1646  . lidocaine (XYLOCAINE) 2 % viscous mouth solution 5 mL  5 mL Mouth/Throat TID Henderson CloudEstela Y Hernandez Acosta, MD   5 mL at 01/22/15 2205   And  . magic mouthwash  5 mL Oral TID Henderson CloudEstela Y Hernandez Acosta, MD   5 mL at 01/22/15 2205  . magic mouthwash  5 mL Oral TID PRN Henderson CloudEstela Y Hernandez Acosta, MD      . ondansetron [facility](ZOFRAN) tablet 4 mg  4 mg Oral Q6H PRN Haydee Monicaachal A David, MD       Or  . ondansetron [facility](ZOFRAN) injection 4 mg  4 mg Intravenous Q6H PRN Haydee Monicaachal A David, MD   4 mg at 01/14/15 0856  . pantoprazole (PROTONIX) injection 40 mg  40 mg Intravenous Q24H Malissa HippoNajeeb U Rehman, MD   40 mg at 01/22/15 2202  . potassium chloride (K-DUR,KLOR-CON) CR tablet 20 mEq  20 mEq Oral Daily Haydee Monicaachal A David, MD   20 mEq at 01/22/15 1037  . vancomycin (VANCOCIN) 1,250 mg in sodium chloride 0.9 % 250 mL IVPB  1,250 mg Intravenous Q24H Gardner CandleJessica C Carney, RPH   1,250 mg at  01/22/15 1217     Discharge Medications: Please see discharge summary for a list of discharge medications.  Relevant Imaging Results:  Relevant Lab Results:   Additional Information SSN: [id number]  Roddie McBryant Campbell MSW, SpencerLCSW, [place], [phone]

## 2015-01-23 NOTE — Clinical Social Work Placement (Signed)
   CLINICAL SOCIAL WORK PLACEMENT  NOTE  Date:  01/23/2015  Patient Details  Name: Kathlynn GrateWilliam H Kader MRN: [id number] Date of Birth: [birth year]  Clinical Social Work is seeking post-discharge placement for this patient at the Skilled  Nursing Facility level of care (*CSW will initial, date and re-position this form in  chart as items are completed):  Yes   Patient/family provided with [facility] Clinical Social Work Department's list of facilities offering this level of care within the geographic area requested by the patient (or if unable, by the patient's family).  Yes   Patient/family informed of their freedom to choose among providers that offer the needed level of care, that participate in Medicare, Medicaid or managed care program needed by the patient, have an available bed and are willing to accept the patient.  Yes   Patient/family informed of [facility]'s ownership interest in [facility] and [facility], as well as of the fact that they are under no obligation to receive care at these facilities.  PASRR submitted to EDS on 01/22/15     PASRR number received on 01/22/15     Existing PASRR number confirmed on       FL2 transmitted to all facilities in geographic area requested by pt/family on 01/22/15     FL2 transmitted to all facilities within larger geographic area on       Patient informed that his/her managed care company has contracts with or will negotiate with certain facilities, including the following:        Yes   Patient/family informed of bed offers received.  [facility]     Physician recommends and patient chooses bed at      [facility]  .  Patient to be transferred to facility by       Patient family notified on   of transfer.  Name of family member notified:        PHYSICIAN Please prepare priority discharge summary, including medications, Please prepare  prescriptions, Please sign FL2, Please sign DNR     Additional Comment:    _______________________________________________ Roddie McBryant Saraiya Kozma MSW, LCSW, [place], [phone]

## 2015-01-24 ENCOUNTER — Encounter (HOSPITAL_COMMUNITY): Payer: Self-pay | Admitting: Primary Care

## 2015-01-24 DIAGNOSIS — G4733 Obstructive sleep apnea (adult) (pediatric): Secondary | ICD-10-CM | POA: Insufficient documentation

## 2015-01-24 DIAGNOSIS — Z515 Encounter for palliative care: Secondary | ICD-10-CM

## 2015-01-24 DIAGNOSIS — R1310 Dysphagia, unspecified: Secondary | ICD-10-CM | POA: Insufficient documentation

## 2015-01-24 DIAGNOSIS — Z7189 Other specified counseling: Secondary | ICD-10-CM

## 2015-01-24 DIAGNOSIS — J9600 Acute respiratory failure, unspecified whether with hypoxia or hypercapnia: Secondary | ICD-10-CM | POA: Insufficient documentation

## 2015-01-24 LAB — APTT
APTT: 103 s — AB (ref 24–37)
aPTT: 93 seconds — ABNORMAL HIGH (ref 24–37)

## 2015-01-24 LAB — BASIC METABOLIC PANEL
ANION GAP: 13 (ref 5–15)
BUN: 48 mg/dL — AB (ref 6–20)
CHLORIDE: 93 mmol/L — AB (ref 101–111)
CO2: 29 mmol/L (ref 22–32)
Calcium: 8.2 mg/dL — ABNORMAL LOW (ref 8.9–10.3)
Creatinine, Ser: 2.1 mg/dL — ABNORMAL HIGH (ref 0.61–1.24)
GFR calc Af Amer: 34 mL/min — ABNORMAL LOW (ref >60)
GFR, EST NON AFRICAN AMERICAN: 29 mL/min — AB (ref >60)
GLUCOSE: 133 mg/dL — AB (ref 65–99)
POTASSIUM: 4.5 mmol/L (ref 3.5–5.1)
SODIUM: 135 mmol/L (ref 135–145)

## 2015-01-24 LAB — CBC
HEMATOCRIT: 28.8 % — AB (ref 39.0–52.0)
HEMOGLOBIN: 8.9 g/dL — AB (ref 13.0–17.0)
MCH: 29.4 pg (ref 26.0–34.0)
MCHC: 30.9 g/dL (ref 30.0–36.0)
MCV: 95 fL (ref 78.0–100.0)
PLATELETS: 275 10*3/uL (ref 150–400)
RBC: 3.03 MIL/uL — ABNORMAL LOW (ref 4.22–5.81)
RDW: 15.3 % (ref 11.5–15.5)
WBC: 20.9 10*3/uL — AB (ref 4.0–10.5)

## 2015-01-24 LAB — HEPARIN LEVEL (UNFRACTIONATED)
HEPARIN UNFRACTIONATED: 0.86 [IU]/mL — AB (ref 0.30–0.70)
Heparin Unfractionated: 0.96 IU/mL — ABNORMAL HIGH (ref 0.30–0.70)

## 2015-01-24 LAB — GLUCOSE, CAPILLARY
GLUCOSE-CAPILLARY: 126 mg/dL — AB (ref 65–99)
Glucose-Capillary: 117 mg/dL — ABNORMAL HIGH (ref 65–99)
Glucose-Capillary: 121 mg/dL — ABNORMAL HIGH (ref 65–99)
Glucose-Capillary: 143 mg/dL — ABNORMAL HIGH (ref 65–99)

## 2015-01-24 MED ORDER — DOCUSATE SODIUM 100 MG PO CAPS: 100.0000 mg | ORAL_CAPSULE | Freq: Two times a day (BID) | ORAL | Status: DC

## 2015-01-24 MED ORDER — HYDRALAZINE HCL 20 MG/ML IJ SOLN: 10.0000 mg | Freq: Four times a day (QID) | INTRAMUSCULAR | Status: DC | PRN

## 2015-01-24 MED ORDER — INSULIN GLARGINE 100 UNIT/ML ~~LOC~~ SOLN
15.0000 [IU] | Freq: Every day | SUBCUTANEOUS | Status: DC
  Filled 2015-01-24 (×2): qty 0.15

## 2015-01-24 NOTE — Progress Notes (Signed)
Name: [name] [name] MRN: [id number] DOB: [birth year]    ADMISSION DATE:  2015/02/10 CONSULTATION DATE:  1/5  REFERRING MD :  Dr. Mahala [name]  CHIEF COMPLAINT:  Effusion  SIGNIFICANT EVENTS  12/31  Admit to [facility] 01/05  Transfer [facility].  STUDIES:  Echo 03/08/07: EF 55%, mild diastolic dysfx, mild/mod LA/RA dilation, mild MR, mild TR, PAS 25 mmHg Spirometry 06/01/12: FEV1 1.52 (58%), FEV1% 70; mixed obstructive/restrictive defect BiPAP 09/28/13 to 10/27/13: used on 30 of 30 nights with average 14 hrs and 29 min. Average AHI is 1.7 with BiPAP 12/8 cm H2O. CTA Chest 12/31: large pulmonary embolism in the left main pulmonary artery extending into the left lower lobe. Atelectasis in the left lower lobe. Chronic dilation of distal esophagus. ECHO 1/4:  Moderate LVH, EF 65- 70%, no RWMA's, possible anterior pericardial effusion vs tissue density. "grossly normal RV, cannot assess RVSP" LE Doppler 1/6: negative Port CXR 1/10:  Persistent left lung opacification with minimal clearing.  ABX:  Levaquin 1/4 >>  Vancomycin 1/8>>> Aztreonam 1/8>>>  CULTURES:  Sputum>>Ordered  SUBJECTIVE:   C/o dyspnea.  Reports "choking" episode this morning while eating breakfast.  "I feel really bad today".    REVIEW OF SYSTEMS: No chest pain or pressure. No nausea or vomiting. No hemoptysis   VITAL SIGNS: Temp:  [97.2 F (36.2 C)-98.7 F (37.1 C)] 98.7 F (37.1 C) (01/11 0754) Pulse Rate:  [54-81] 70 (01/11 0754) Resp:  [18-28] 20 (01/11 0754) BP: (101-127)/(57-76) 127/59 mmHg (01/11 0754) SpO2:  [93 %-100 %] 98 % (01/11 0807) Weight:  [242 lb 9.6 oz (110.043 kg)] 242 lb 9.6 oz (110.043 kg) (01/11 0500)  PHYSICAL EXAMINATION: General:  Awake. Alert. NAD but uncomfortable appearing.  Integument:  Warm & dry. No rash on exposed skin.  HEENT:  Moist mucus membranes. No oral ulcers. No scleral injection or icterus. Cardiovascular:  Regular rate. No appreciable JVD. Normal S1 & S2. Pulmonary:   resps even non labored on Will, Slightly decreased aeration left lung base. Symmetric chest wall expansion. No accessory muscle use on nasal cannula oxygen. Abdomen: Soft. Normal bowel sounds. Protuberant.   Recent Labs Lab 01/22/15 0840 01/23/15 0335 01/24/15 0447  NA 139 138 135  K 4.0 3.7 4.5  CL 93* 91* 93*  CO2 31 34* 29  BUN 54* 50* 48*  CREATININE 2.07* 1.98* 2.10*  GLUCOSE 211* 137* 133*    Recent Labs Lab 01/22/15 0840 01/23/15 0335 01/24/15 0447  HGB 9.6* 8.7* 8.9*  HCT 29.4* 28.5* 28.8*  WBC 17.7* 17.7* 20.9*  PLT PLATELET CLUMPS NOTED ON SMEAR, COUNT APPEARS ADEQUATE 265 275   Dg Chest Port 1 View  01/23/2015  CLINICAL DATA:  Dyspnea EXAM: PORTABLE CHEST 1 VIEW COMPARISON:  01/22/2015 FINDINGS: Cardiomegaly again noted. Central mild vascular congestion without convincing pulmonary edema. Slight improvement in aeration in left upper lobe. Again noted dense atelectasis or infiltrate in left lower lobe. Question small left pleural effusion. Stable right basilar atelectasis. IMPRESSION: Central mild vascular congestion without convincing pulmonary edema. Slight improvement in aeration in left upper lobe. Again noted dense atelectasis or infiltrate in left lower lobe. Question small left pleural effusion. Electronically Signed   By: [name] [name] M.D.   On: 01/23/2015 09:10    ASSESSMENT / PLAN:  75 year old male with acute left pulmonary embolus with lung infarction and now with mucus plugging. Symptomatically improving. Hemoptysis improving slightly. Question possible HCAP. Patient seems to be clinically improving with his current course of  therapy and is tolerating systemic anticoagulation with heparin. He's had no further evidence of acute hemoptysis with bright red blood. I suspect that he has transient desaturation this morning was likely due to some element of mucus plugging. I did encourage him to continue use of his incentive spirometer which is poor at  best.  1. Hemoptysis: Resolved. Likely secondary to acute PE & lung infarction. Continuing nebs & close monitoring. Continue heparin gtt as below.  2. Acute Pulmonary Embolus with Lung Infarction: Tolerating heparin drip per pharmacy protocol. 3. HCAP: Awaiting sputum culture. Continue chest PT. Patient counseled on technique with IS again today. Continuing broad spectrum antibiotics. Consider discontinuing Levaquin tomorrow. 4. Chronic Left Pleural Effusion: No thoracentesis at this time. 5. OSA: Continue home BiPAP. 6. Suspect ongoing aspiration -- episode of "choking" this morning which wife says happens frequently at home. Consider further speech involvement.    Overall remains marginal without significant improvement.  Likely ongoing chronic aspiration, chronic large pleural effusion now with PE and lung infarction.  Pt DNR, Consider palliative care involvement for further discussions regarding long term goals of care and possibly consider options for home with hospice.    [name] DressKaty Jett Fukuda, NP 01/24/2015  9:42 AM Pager: [phone] or [phone]

## 2015-01-24 NOTE — Evaluation (Signed)
Clinical/Bedside Swallow Evaluation Patient Details  Name: [name] [name] MRN: [id number] Date of Birth: [birth year]  Today's Date: 01/24/2015 Time: SLP Start Time (ACUTE ONLY): 1010 SLP Stop Time (ACUTE ONLY): 1100 SLP Time Calculation (min) (ACUTE ONLY): 50 min  Past Medical History:  Past Medical History  Diagnosis Date  . Unspecified essential hypertension   . Type II or unspecified type diabetes mellitus without mention of complication, not stated as uncontrolled   . Esophageal reflux   . Gout   . Depressive disorder, not elsewhere classified   . OSA (obstructive sleep apnea)     BiPAP  . COPD (chronic obstructive pulmonary disease) (HCC)   . Respiratory failure (HCC)   . BPH (benign prostatic hyperplasia)   . Pleural effusion, left 06/14/2007        . Pulmonary hypertension (HCC)   . Cor pulmonale (HCC)     R heart failure  . Diverticulosis   . CAD (coronary artery disease)     non-obsctructive   . Obesity   . History of nuclear stress test 06/18/2006    [facility]; normal myoview with diminished oxygen sats   . CHF (congestive heart failure) (HCC)   . Chronic atrial fibrillation [facility])    Past Surgical History:  Past Surgical History  Procedure Laterality Date  . Turp vaporization    . Cholecystectomy    . Tonsillectomy    . Inguinal hernia repair    . Transthoracic echocardiogram  03/08/2007    EF 55%; mild diastolic dysfunction; mild aortic valve stenosis with trivial AV regurg; mild MR; LA mild-mod dilated; RV mildly dilated; trivial pulm regurg; mild TR; RA mil-mod dilated  . Cardiac catheterization  08/25/2006    mild pulm htn; mild atherosclerosis but no significant CAD   HPI:  75 yo male adm to [facility] February 06, 2015 with respiratory difficulties/ ? CHF.  Pt with extensive dysphagia hx including DM, GERD, OHS, COPD, Afib, HTN, pna, left pleural effusion, pulmonary embolism.  Pt today had difficulties swallowing food and milk causing him to choke.  He has undergone prior  esopahgeal testing showing very large hiatal hernia with approx 1/3 of stomach in intrathoracic region - paraesophageal hernia, esophageal dysmotility, large epiphrenic diverticulum.  Pt is not stable for EGD and significant other reports pt was informed he could not have more surgery until completely unable to swallow - however per MD, pt not stable for surgery.  Bedside swallow eval ordered.     Assessment / Plan / Recommendation Clinical Impression  Pt presents with symptoms of severe esophageal deficits, suspect intact oropharyngeal swallow overall.  Swallow was timely with liquids observed  (glucerna, water) = and pt with negative CN exam.    Unfortunately post=swallow within 3 minutes belching and expectoration all intake noted.  This occurred despite cues to conduct multiple swallows, consume warm water to help facilitate clearance. Suspect very poor motility along with hernia and diverticulum causing regurgitation.   Pt does sense stasis pointing to proximal esophagus- upper chest region stating "It stopped".   He was overall quite protective of his airway with regurgitation- expectorating and suctioning, however regurgitation is the source of his aspiration risk.   SLP provided pt with suctioning line, Yankeur's tissues, emesis contation for his comfort/use.  Pt has known large HH, large epiphrenic diverticulum, and esophageal dysmotility but is not stable for surgery per md and given multiple comorbidities surgical intervention not warranted.    Pt admits to typically swallowing liquids better than solids but admits to  significant worsening since hospital admit.  He does state he was able to clear meatloaf yesterday - therefore suspect some variance in ability.    Note palliative referral ordered and pallaitive NP present to see pt.   Considering pt has been managing his dysphagia for years, if deem warranted per careplan= diet as pt desired may be helpful.  SLP reveiwed mitigation strategies  including warm liquids, small amounts at a time, sitting upright, dry swallows, etc with pt and "Red".   Using teach back education completed with pt and Red and posted signs with information.  Pt reportedly eats laying down at home, advised against this practice to allow gravity to aid clearance. Thanks for this consult.   MD arrived during session and due to aspiration risk, he reports he plans for NPO except ice/needed po medications pending goals of care.  SlP has educated pt/Red to mitigation strategies including items that maybe better tolerated.  Will sign off, please reorder if desired.     Aspiration Risk  Severe aspiration risk (due to known esophageal deficits)    Diet Recommendation NPO;Ice chips PRN after oral care (comfort po if indicated)   Medication Administration: Via alternative means (or necessary meds with liquids) Supervision: Patient able to self feed    Other  Recommendations Oral Care Recommendations: Oral care BID   Follow up Recommendations  None    Frequency and Duration            Prognosis        Swallow Study   General Date of Onset: 01/24/15 HPI: 75 yo [facility] with respiratory difficulties/ ? CHF.  Pt with extensive dysphagia hx including DM, GERD, OHS, COPD, Afib, HTN, pna, left pleural effusion, pulmonary embolism.  Pt today had difficulties swallowing food and milk causing him to choke.  He has undergone prior esopahgeal testing showing very large hiatal hernia with approx 1/3 of stomach in intrathoracic region - paraesophageal hernia, esophageal dysmotility, large epiphrenic diverticulum.  Pt is not stable for EGD and significant other reports pt was informed he could not have more surgery until completely unable to swallow - however per MD, pt not stable for surgery.  Bedside swallow eval ordered.   Type of Study: Bedside Swallow Evaluation Diet Prior to this Study: Dysphagia 3 (soft);Thin liquids Temperature Spikes Noted:  No Respiratory Status: Nasal cannula History of Recent Intubation: No Behavior/Cognition: Alert;Cooperative;Pleasant mood Oral Cavity Assessment: Erythema Oral Care Completed by SLP: No Oral Cavity - Dentition:  (poor dentition, pt reports tooth loss yesterday) Vision: Functional for self-feeding Self-Feeding Abilities: Able to feed self Patient Positioning: Upright in bed Baseline Vocal Quality: Normal Volitional Cough: Strong Volitional Swallow: Able to elicit    Oral/Motor/Sensory Function Overall Oral Motor/Sensory Function: Within functional limits   Ice Chips Ice chips: Not tested   Thin Liquid Thin Liquid: Impaired Pharyngeal  Phase Impairments: Multiple swallows Other Comments: regurgitation    Nectar Thick Nectar Thick Liquid: Impaired Presentation: Straw Pharyngeal Phase Impairments: Multiple swallows Other Comments: regurgitation   Honey Thick Honey Thick Liquid: Not tested   Puree Puree: Not tested   Solid   GO    Solid: Not tested       Donavan Burnetamara Akasha Melena, MS [facility] SLP [phone]

## 2015-01-24 NOTE — Progress Notes (Signed)
ANTICOAGULATION CONSULT NOTE   Pharmacy Consult for heparin Indication: atrial fibrillation, PE  Allergies  Allergen Reactions  . Cefaclor     Reactions are unknown  . Cephalexin     Reactions are unknown  . Sulfamethoxazole-Trimethoprim     Reactions are unknown   Patient Measurements: Height: 5\' 8"  (172.7 cm) Weight: 242 lb 9.6 oz (110.043 kg) IBW/kg (Calculated) : 68.4 Heparin Dosing Weight: 93 kg  Vital Signs: Temp: 97.9 F (36.6 C) (01/11 1551) Temp Source: Oral (01/11 1551) BP: 141/74 mmHg (01/11 1551) Pulse Rate: 73 (01/11 1551)  Labs:  Recent Labs  01/22/15 0840  01/23/15 0335  01/23/15 2255 01/24/15 0447 01/24/15 0852 01/24/15 1841  HGB 9.6*  --  8.7*  --   --  8.9*  --   --   HCT 29.4*  --  28.5*  --   --  28.8*  --   --   PLT PLATELET CLUMPS NOTED ON SMEAR, COUNT APPEARS ADEQUATE  --  265  --   --  275  --   --   APTT  --   < > 39*  < > 109* 103* 93*  --   HEPARINUNFRC  --   < > 0.81*  < > 0.86*  --  0.96* 0.86*  CREATININE 2.07*  --  1.98*  --   --  2.10*  --   --   < > = values in this interval not displayed. Estimated Creatinine Clearance: 36.5 mL/min (by C-G formula based on Cr of 2.1).  Assessment: 75 yo male with h/o Afib, new PE, for heparin.   Patient was on apixaban with last dose received 8 days ago (no further affects on lab values based on PK of apixaban).   Has had hemotysis but not further reported.   Currently on heparin 1100 units/hr with HL of 0.86.   Goal of Therapy:  Heparin level 0.3-0.7 units/ml Monitor platelets by anticoagulation protocol: Yes   Plan:  Decrease heparin drip to 950 units/hr F/u 8 hr HL Daily HL, CBC Oral AC when able  Vinnie LevelBenjamin Lashika Erker, PharmD., BCPS Clinical Pharmacist Pager [phone]

## 2015-01-24 NOTE — Progress Notes (Signed)
Upon arrival to patient room patient was resting comfortably.  Gave patient scheduled nebulizer treatment and patient asked to go on bipap.  Stated "I need my air".  Patient placed on Bipap and is currently tolerating well.  Will hold on CPT at this time.

## 2015-01-24 NOTE — Progress Notes (Signed)
Rn assisted pt with taking Po medications, pt did well with first few pills, and then started to have severe emesis. RN will hold any further PO Medications and Notify MD

## 2015-01-24 NOTE — Progress Notes (Signed)
ANTICOAGULATION CONSULT NOTE   Pharmacy Consult for heparin Indication: atrial fibrillation, PE  Allergies  Allergen Reactions  . Cefaclor     Reactions are unknown  . Cephalexin     Reactions are unknown  . Sulfamethoxazole-Trimethoprim     Reactions are unknown   Patient Measurements: Height: 5\' 8"  (172.7 cm) Weight: 240 lb 8 oz (109.09 kg) IBW/kg (Calculated) : 68.4 Heparin Dosing Weight: 93 kg  Vital Signs: Temp: 97.2 F (36.2 C) (01/10 1950) Temp Source: Axillary (01/10 1950) BP: 126/76 mmHg (01/10 1950) Pulse Rate: 81 (01/10 2208)  Labs:  Recent Labs  01/21/15 0809 01/22/15 0840  01/23/15 0335 01/23/15 1500 01/23/15 2255  HGB 9.2* 9.6*  --  8.7*  --   --   HCT 29.0* 29.4*  --  28.5*  --   --   PLT 265 PLATELET CLUMPS NOTED ON SMEAR, COUNT APPEARS ADEQUATE  --  265  --   --   APTT  --   --   < > 39* 57* 109*  HEPARINUNFRC  --   --   < > 0.81* 0.88* 0.86*  CREATININE 1.99* 2.07*  --  1.98*  --   --   < > = values in this interval not displayed. Estimated Creatinine Clearance: 38.6 mL/min (by C-G formula based on Cr of 1.98).  Assessment: 75 yo male with h/o Afib, new PE, for heparin. Goal of Therapy:  aPTT 66-102 Heparin level 0.3-0.7 units/ml Monitor platelets by anticoagulation protocol: Yes   Plan:  Decrease Heparin 1300 units/hr Follow-up am labs.  Geannie RisenGreg Damaree Sargent, PharmD, BCPS  01/24/2015 12:22 AM

## 2015-01-24 NOTE — Progress Notes (Signed)
Pt placed on bipap with 5 lpm oxygen bled in.  Pt tolerating well

## 2015-01-24 NOTE — Consult Note (Signed)
Consultation Note Date: 01/24/2015   Patient Name: [name] [name]  DOB: 12/25/1940  MRN: [id number]  Age / Sex: 75 y.o., male  PCP: Assunta Found, MD Referring Physician: Vassie Loll, MD  Reason for Consultation: Establishing goals of care, Hospice Evaluation and Psychosocial/spiritual support    Clinical Assessment/Narrative: [name] [name] is sitting up in bed talking with Dr. Gwenlyn Perking and ST.  St recommends ice chips only at this time. We talk about his chronic health concerns, and going to [facility] rehab when able. [name] [name] mother was there for 12 years.  [name] [name] states, "I'm on the way out".  Mrs. Battiste tells me that he has been saying that the last few days.  CCM physician, Dr. Jamison Neighbor, arrives and shares the worry over increased WBC with triple antibiotic therapy.  We talk about options for [name] [name], such as staying in the hospital and fighting as long as they can, or  if no improvement in condition, no decrease or increase in WBC, going home with [facility], or if marked increase in WBC and decline in condition, going to in patient [facility].   Mrs. Reinhardt tells me that this is the first she has heard that he would be hospice eligible.  She shares that an aunt had services and she is familiar with Hospice.  [name] [name] tells me that his preference is to die at home, and Mrs. Perona states she is able to support him in this.  I reassure them that no decisions need to be made at this time, and that we will continue to do all we can for them, testing blood work tomorrow.    Contacts/Participants in Discussion: [name] [name] Primary Decision Maker: [name] [name] with the support of his wife. Wife talks about their grand daughter [name] [name] (they adopted as their daughter) who lives in their home and helps with decisions.  Relationship to Patient self HCPOA: no   SUMMARY  OF RECOMMENDATIONS  Code Status/Advance Care Planning: DNR  We discuss the concepts of allow a natural death, Hospice and transition to comfort care.     Code Status Orders        Start     Ordered   01/20/15 1053  Do not attempt resuscitation (DNR)   Continuous    Question Answer Comment  Maintain current active treatments No   Do not initiate new interventions No      01/20/15 1052    Code Status History    Date Active Date Inactive Code Status Order ID Comments User Context   01/13/2015  1:40 AM 01/20/2015 10:52 AM Full Code [id number]  Haydee Monica, MD Inpatient      Other Directives:None  Symptom Management:   Per hospitalist   Palliative Prophylaxis:   Bowel Regimen, Oral Care and Turn Reposition  Additional Recommendations (Limitations, Scope, Preferences):  We discuss the increase in WBC with use of triple antibiotic therapy and monitoring for improvement or decline over the next 24-48 hours.    Psycho-social/Spiritual:  Support System: Strong, lives with wife of 47 years, adopted granddaughter [name] [name] and her husband live in their home as caregivers.  Seventh great grandchild recently born. Retired from [employer].  Desire for further Chaplaincy support:Not discussed today.  Additional Recommendations: Education on Hospice  Prognosis: Unable to determine, based on outcomes.  If no response to triple IV antibiotic therapy, would suggest weeks at best.   Discharge Planning: Based on  outcomes.   We discuss  1) staying in the hospital and fighting as long as they can.  2) if no improvement in condition, no decrease or increase in WBC, going home with [facility].  3) if marked increase in WBC and decline in condition, going to in patient [facility].  Mrs. Gajda tells me that this is the first she has heard that he would be hospice eligible.  She shares that an aunt had services and she is familiar with Hospice.    [name] that his preference is to die at home, and Mrs. Hulick states she is able to support him in this.    Chief Complaint/ Primary Diagnoses: Present on Admission:  . Hemoptysis . Obesity hypoventilation syndrome (HCC) . 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 Permanent atrial fibrillation (HCC) . COPD with chronic bronchitis (HCC) . RESPIRATORY FAILURE, CHRONIC . Elevated troponin . Acute kidney injury (HCC) . Pulmonary embolus (HCC)  I have reviewed the medical record, interviewed the patient and family, and examined the patient. The following aspects are pertinent.  Past Medical History  Diagnosis Date  . Unspecified essential hypertension   . Type II or unspecified type diabetes mellitus without mention of complication, not stated as uncontrolled   . Esophageal reflux   . Gout   . Depressive disorder, not elsewhere classified   . OSA (obstructive sleep apnea)     BiPAP  . COPD (chronic obstructive pulmonary disease) (HCC)   . Respiratory failure (HCC)   . BPH (benign prostatic hyperplasia)   . Pleural effusion, left 06/14/2007        . Pulmonary hypertension (HCC)   . Cor pulmonale (HCC)     R heart failure  . Diverticulosis   . CAD (coronary artery disease)     non-obsctructive   . Obesity   . History of nuclear stress test 06/18/2006    [facility]; normal myoview with diminished oxygen sats   . CHF (congestive heart failure) (HCC)   . Chronic atrial fibrillation [facility])    Social History   Social History  . Marital Status: Married    Spouse Name: N/A  . Number of Children: N/A  . Years of Education: N/A   Occupational History  . Retired    Social History Main Topics  . Smoking status: Never Smoker   . Smokeless tobacco: None     Comment: Remote history of tobacco history  . Alcohol Use: No  . Drug Use: No  . Sexual Activity: Not Asked   Other Topics Concern  . None   Social History Narrative   Family History  Problem  Relation Age of Onset  . Prostate cancer Father    Scheduled Meds: . albuterol  3 mL Inhalation Q6H  . allopurinol  300 mg Oral Daily  . antiseptic oral rinse  7 mL Mouth Rinse q12n4p  . aztreonam  1 g Intravenous 3 times per day  . chlorhexidine  15 mL Mouth Rinse BID  . diltiazem  240 mg Oral Daily  . finasteride  5 mg Oral Daily  . furosemide  40 mg Intravenous Q12H  . guaiFENesin  1,200 mg Oral BID  . insulin aspart  0-15 Units Subcutaneous TID WC  . insulin aspart  0-5 Units Subcutaneous QHS  . insulin aspart  4 Units Subcutaneous TID WC  . insulin glargine  30 Units Subcutaneous BID  . levofloxacin (LEVAQUIN) IV  750 mg Intravenous  Q48H  . lidocaine  5 mL Mouth/Throat TID   And  . magic mouthwash  5 mL Oral TID  . pantoprazole (PROTONIX) IV  40 mg Intravenous Q24H  . potassium chloride  20 mEq Oral Daily  . senna-docusate  2 tablet Oral BID  . vancomycin  1,500 mg Intravenous Q48H   Continuous Infusions: . heparin 1,100 Units/hr (01/24/15 1004)   PRN Meds:.acetaminophen, dextromethorphan-guaiFENesin, magic mouthwash, ondansetron **OR** ondansetron (ZOFRAN) IV Medications Prior to Admission:  Prior to Admission medications   Medication Sig Start Date End Date Taking? Authorizing Provider  allopurinol (ZYLOPRIM) 300 MG tablet Take 300 mg by mouth daily.     Yes Historical Provider, MD  aspirin 81 MG tablet Take 243 mg by mouth daily.   Yes Historical Provider, MD  dextromethorphan-guaiFENesin (MUCINEX DM) 30-600 MG per 12 hr tablet Take 1 tablet by mouth 2 (two) times daily as needed for cough.    Yes Historical Provider, MD  docusate sodium (STOOL SOFTENER) 100 MG capsule Take 100 mg by mouth daily.   Yes Historical Provider, MD  esomeprazole (NEXIUM) 40 MG capsule Take 40 mg by mouth every other day.    Yes Historical Provider, MD  finasteride (PROSCAR) 5 MG tablet Take 5 mg by mouth daily. 10/24/14  Yes Historical Provider, MD  furosemide (LASIX) 40 MG tablet Take 80 mg  by mouth daily. \   Yes Historical Provider, MD  ibuprofen (ADVIL,MOTRIN) 800 MG tablet Take 800 mg by mouth 2 (two) times daily as needed for mild pain or moderate pain.  10/24/14  Yes Historical Provider, MD  insulin aspart (NOVOLOG FLEXPEN) 100 UNIT/ML FlexPen Inject 60 Units into the skin 3 (three) times daily with meals.   Yes Historical Provider, MD  insulin glargine (LANTUS) 100 UNIT/ML injection Inject 75 Units into the skin 2 (two) times daily.    Yes Historical Provider, MD  levalbuterol [facility] HFA) 45 MCG/ACT inhaler Inhale 1-2 puffs into the lungs every 4 (four) hours as needed. 01/04/14  Yes Coralyn Helling, MD  metolazone (ZAROXOLYN) 5 MG tablet Take 5 mg by mouth once a week. Takes as needed for fluid retention   Yes Historical Provider, MD  OXYGEN Inhale into the lungs at bedtime.   Yes Historical Provider, MD  potassium chloride (K-DUR) 10 MEQ tablet Take 20 mEq by mouth daily. 01/09/15  Yes Historical Provider, MD  triamcinolone (NASACORT) 55 MCG/ACT nasal inhaler Place 2 sprays into the nose daily as needed (for congestion).    Yes Historical Provider, MD   Allergies  Allergen Reactions  . Cefaclor     Reactions are unknown  . Cephalexin     Reactions are unknown  . Sulfamethoxazole-Trimethoprim     Reactions are unknown    Review of Systems  Unable to perform ROS: Other    Physical Exam  Nursing note and vitals reviewed. Constitutional: He is oriented to person, place, and time. He appears well-developed and well-nourished.  obese  HENT:  Head: Normocephalic and atraumatic.  Cardiovascular: Normal rate and regular rhythm.   Respiratory: Effort normal. No respiratory distress.  Productive cough with old blood, heavy mucous  GI: Soft. Bowel sounds are normal. There is no tenderness. There is no guarding.  Obese abd.   Neurological: He is alert and oriented to person, place, and time.  Skin:  BL toes are purple.     Vital Signs: BP 134/67 mmHg  Pulse 70   Temp(Src) 98.6 F (37 C) (Oral)  Resp 27  Ht 5\' 8"  (1.727 m)  Wt 110.043 kg (242 lb 9.6 oz)  BMI 36.90 kg/m2  SpO2 98%  SpO2: SpO2: 98 % O2 Device:SpO2: 98 % O2 Flow Rate: .O2 Flow Rate (L/min): 3 L/min  IO: Intake/output summary:  Intake/Output Summary (Last 24 hours) at 01/24/15 1438 Last data filed at 01/24/15 1208  Gross per 24 hour  Intake     60 ml  Output   1550 ml  Net  -1490 ml    LBM: Last BM Date: 01/23/15 Baseline Weight: Weight: 109.8 kg (242 lb 1 oz) Most recent weight: Weight: 110.043 kg (242 lb 9.6 oz)      Palliative Assessment/Data:  Flowsheet Rows        Most Recent Value   Intake Tab    Referral Department  Hospitalist   Unit at Time of Referral  Cardiac/Telemetry Unit   Palliative Care Primary Diagnosis  Pulmonary   Date Notified  01/23/15   Palliative Care Type  New Palliative care   Reason for referral  Clarify Goals of Care   Date of Admission  12/20/2014   # of days IP prior to Palliative referral  11   Clinical Assessment    Psychosocial & Spiritual Assessment    Palliative Care Outcomes       Additional Data Reviewed:  CBC:    Component Value Date/Time   WBC 20.9* 01/24/2015 0447   HGB 8.9* 01/24/2015 0447   HCT 28.8* 01/24/2015 0447   PLT 275 01/24/2015 0447   MCV 95.0 01/24/2015 0447   NEUTROABS PENDING 01/19/2015 0500   LYMPHSABS PENDING 01/19/2015 0500   MONOABS PENDING 01/19/2015 0500   EOSABS PENDING 01/19/2015 0500   BASOSABS PENDING 01/19/2015 0500   Comprehensive Metabolic Panel:    Component Value Date/Time   NA 135 01/24/2015 0447   K 4.5 01/24/2015 0447   CL 93* 01/24/2015 0447   CO2 29 01/24/2015 0447   BUN 48* 01/24/2015 0447   CREATININE 2.10* 01/24/2015 0447   GLUCOSE 133* 01/24/2015 0447   CALCIUM 8.2* 01/24/2015 0447   AST 16 01/18/2015 0532   ALT 16* 01/18/2015 0532   ALKPHOS 100 01/18/2015 0532   BILITOT 0.5 01/18/2015 0532   PROT 6.8 01/18/2015 0532   ALBUMIN 2.7* 01/18/2015 0532     Time In:  1100 Time Out: 1220 Time Total:  80 minutes  GOC discussion shared with nursing staff, Dr. Gwenlyn PerkingMadera, and Dr. Jamison NeighborNestor.  Greater than 50%  of this time was spent counseling and coordinating care related to the above assessment and plan.  Signed by: Katheran Aweove,Tasha A, NP  Katheran Aweasha A Dove, NP  01/24/2015, 2:38 PM  Please contact Palliative Medicine Team phone at [phone] for questions and concerns.

## 2015-01-24 NOTE — Progress Notes (Signed)
ANTICOAGULATION CONSULT NOTE   Pharmacy Consult for heparin Indication: atrial fibrillation, PE  Allergies  Allergen Reactions  . Cefaclor     Reactions are unknown  . Cephalexin     Reactions are unknown  . Sulfamethoxazole-Trimethoprim     Reactions are unknown   Patient Measurements: Height: 5\' 8"  (172.7 cm) Weight: 242 lb 9.6 oz (110.043 kg) IBW/kg (Calculated) : 68.4 Heparin Dosing Weight: 93 kg  Vital Signs: Temp: 98.7 F (37.1 C) (01/11 0754) Temp Source: Oral (01/11 0754) BP: 127/59 mmHg (01/11 0754) Pulse Rate: 70 (01/11 0754)  Labs:  Recent Labs  01/22/15 0840  01/23/15 0335 01/23/15 1500 01/23/15 2255 01/24/15 0447 01/24/15 0852  HGB 9.6*  --  8.7*  --   --  8.9*  --   HCT 29.4*  --  28.5*  --   --  28.8*  --   PLT PLATELET CLUMPS NOTED ON SMEAR, COUNT APPEARS ADEQUATE  --  265  --   --  275  --   APTT  --   < > 39* 57* 109* 103* 93*  HEPARINUNFRC  --   < > 0.81* 0.88* 0.86*  --  0.96*  CREATININE 2.07*  --  1.98*  --   --  2.10*  --   < > = values in this interval not displayed. Estimated Creatinine Clearance: 36.5 mL/min (by C-G formula based on Cr of 2.1).  Assessment: 75 yo male with h/o Afib, new PE, for heparin.   Patient was on apixaban with last dose received 8 days ago (no further affects on lab values based on PK of apixaban).   Has had hemotysis but not further reported.   Currently on heparin 1500 units/hr with HL of 0.96 (No longer need to follow aPTT since the affects of apixaban have worn off)  Goal of Therapy:  Heparin level 0.3-0.7 units/ml Monitor platelets by anticoagulation protocol: Yes   Plan:  Decrease heparin drip to 1100 units/hr Follow only HL - next at 1800 Daily HL, CBC Oral AC when able  Isaac BlissMichael Feliberto Stockley, PharmD, BCPS, [facility] Clinical Pharmacist Pager [phone] 01/24/2015 9:58 AM

## 2015-01-24 NOTE — Progress Notes (Signed)
Patient Demographics  [name] [name], is a 75 y.o. male, DOB - [birth year], WUJ:[id number]  Admit date - February 08, 2015   Admitting Physician Haydee Monica, MD  Outpatient Primary MD for the patient is [name], MD  LOS - 11   Chief Complaint  Patient presents with  . Shortness of Breath  . Coughing up blood        Admission HPI/Brief narrative: 75 year old male with PMHof  diabetes, GERD, OSA/OHS on nocturnal BiPAP, COPD , atrial fibrillation, chronic left pleural effusion, and hypertension presents to [facility] pain with complaints of dyspnea and hemoptysis, CTA chest was significant for pulmonary embolism, transitioned to Eliiquis, developed hemoptysis, initially on Eliquis , transitioned to  heparin GTT,with worsening respiratory failure, with increased oxygen demand, requiring BiPAP, heparin GTT for couple days secondary to significant hemoptysis, currently back on heparin GTT.   Subjective:   [place] Fuel today with episode of chocking and worsening SOB. By assessment on SPL very high risk for aspiration. Diet ordered changed to NPO. Hemoptysis significantly improved over last 24 hours. 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 - Continue with BiPAP daily at bedtime and PRN for now. -Respiratory failure  worsened secondary atelectasis from mucous plugging and chronic pleural effusion, treated with pulmonary toilet, chest PT as per pulmonary recommendation. -now with ongoing concerns for aspiration. SPL on board. Currently NPO -palliative consulted -continue current antibiotics -prognosis guarded   Pulmonary embolism with suspected lung infarction/hemoptysis - Initially on Eliquis , changed to heparin GTT given his hemoptysis, patient with significant amount of hemoptysis 01/19/2015 -heparin was held for 48 hours and resumed now by PCCM rec's - not a candidate for systemic or catheter lysis - lower extremity Doppler with no evidence of DVT, no role for IVC filter.  Left hemithorax opacification on chest x-ray - Patient with known chronic left pleural effusion has been seen by Dr. Craige Cotta as outpatient. - Within evidence of worsening left hemithorax, this is due to atelectasis from mucus plugging/blood and chronic pleural effusion. - Continue with pulmonary toilet, chest PT, Mucomyst was stopped -No role for thoracentesis per pulmonary   HCAP/Aspiration - Patient initially  on levofloxacin, started on vancomycin and aztreonam 01/20/2015 by pulmonary for concern HCAP, especially in the sitting of worsening leukocytosis and more difficulty breathing -will monitor and continue current abx's -WBC's continue trending up -most likely due to ongoing aspiration process.  Chronic atrial fibrillation - CHA2DS2-VASc greater than 4 - heart rate controlled on diltiazem; but medication discontinued due to dysphagia -will  monitor  Dysphagia - Patient with known esophageal motility disorder, and large epiphrenic diverticulum, this could be secondary to reflux esophagitis versus full filled diverticulum - seen by GI dr. Kelvin Cellar at [facility]. - unstable for  EGD - given ongoing aspiration will switch to NPO -follow palliative GOC meeting and final decision; family inclining to comfort feeding and hospice   Elevated troponin -secondary to PE -no CP  Leukocytosis - Stress related secondary to PE and pulmonary infarction, also with concerns for aspiration -will continue with current antibiotics -remains afebrile  Diabetes mellitus - Continue with insulin sliding scale,  and Lantus . -adjusted, as currently NPO  Oral thrush - Treated with Diflucan, continue with Magic mouthwash -improved   Chronic kidney disease stage III  - Appears to be at baseline   Code Status: DNR  Family Communication: wife at bedside  Disposition Plan: Palliative care discussing GOC with patient; base on my conversation they will preferred home with hospice    Procedures  See below for x-ray reports    Consults   Gastroenterology- Dr. Karilyn Cota at [facility] Pulmonary service : Dr. Jamison Ne[facility]   Medications  Scheduled Meds: . albuterol  3 mL Inhalation Q6H  . antiseptic oral rinse  7 mL Mouth Rinse q12n4p  . aztreonam  1 g Intravenous 3 times per day  . chlorhexidine  15 mL Mouth Rinse BID  . furosemide  40 mg Intravenous Q12H  . insulin aspart  0-15 Units Subcutaneous TID WC  . [START ON 2015/02/18] insulin glargine  15 Units Subcutaneous Daily  . levofloxacin (LEVAQUIN) IV  750 mg Intravenous Q48H  . lidocaine  5 mL Mouth/Throat TID   And  . magic mouthwash  5 mL Oral TID  . pantoprazole (PROTONIX) IV  40 mg Intravenous Q24H  . vancomycin  1,500 mg Intravenous Q48H   Continuous Infusions: . heparin 1,100 Units/hr (01/24/15 1004)   PRN Meds:.acetaminophen, hydrALAZINE, magic mouthwash, ondansetron **OR** ondansetron (ZOFRAN) IV  DVT Prophylaxis  Heparin GTT resumed   Lab Results  Component Value Date   PLT 275 01/24/2015    Antibiotics   Anti-infectives    Start     Dose/Rate Route Frequency Ordered Stop   01/24/15  0600  vancomycin (VANCOCIN) 1,500 mg in sodium chloride 0.9 % 500 mL IVPB     1,500 mg 250 mL/hr over 120 Minutes Intravenous Every 48 hours 01/23/15 1229     01/22/15 1400  levofloxacin (LEVAQUIN) IVPB 750 mg     750 mg 100 mL/hr over 90 Minutes Intravenous Every 48 hours 01/21/15 1401     01/21/15 1200  vancomycin (VANCOCIN) 1,250 mg in sodium chloride 0.9 % 250 mL IVPB  Status:  Discontinued     1,250 mg 166.7 mL/hr over 90 Minutes Intravenous Every 24 hours 01/20/15 1112 01/23/15 1229   01/20/15 1115  vancomycin (VANCOCIN) 2,500 mg in sodium chloride 0.9 % 500 mL IVPB     2,500 mg 250 mL/hr over 120 Minutes Intravenous  Once 01/20/15 1112 01/20/15 1411   01/20/15 1115  aztreonam (AZACTAM) 1 g in dextrose 5 % 50 mL IVPB     1 g 100 mL/hr over 30 Minutes Intravenous 3 times per day 01/20/15 1112     01/17/15 0915  levofloxacin (LEVAQUIN) IVPB 500 mg  Status:  Discontinued     500 mg 100 mL/hr over 60 Minutes Intravenous Every 24 hours 01/17/15 0906 01/21/15 1401   01/15/15 1100  fluconazole (DIFLUCAN) tablet 100 mg  100 mg Oral Daily 01/15/15 1057 01/21/15 1155   01/13/15 0930  ciprofloxacin (CIPRO) IVPB 400 mg  Status:  Discontinued     400 mg 200 mL/hr over 60 Minutes Intravenous Every 12 hours 01/13/15 0841 01/17/15 0906         Objective:   Filed Vitals:   01/24/15 0807 01/24/15 1147 01/24/15 1407 01/24/15 1551  BP:  136/94 134/67 141/74  Pulse:  73 70 73  Temp:  98.6 F (37 C)  97.9 F (36.6 C)  TempSrc:  Oral  Oral  Resp:  18 27 18   Height:      Weight:      SpO2: 98% 97% 98% 97%    Wt Readings from Last 3 Encounters:  01/24/15 110.043 kg (242 lb 9.6 oz)  12/02/13 129.729 kg (286 lb)  10/21/12 133.358 kg (294 lb)     Intake/Output Summary (Last 24 hours) at 01/24/15 1813 Last data filed at 01/24/15 1812  Gross per 24 hour  Intake     60 ml  Output   1850 ml  Net  -1790 ml     Physical Exam Awake Alert, Oriented X 3, on nasal cannula  supplementation. No fever. Experienced chocking episode and has continue to have difficulties swallowing even medications  [place].AT,PERRAL Supple Neck,No JVD, No cervical lymphadenopathy appriciated.  Symmetrical Chest wall movement,decreased air entry in the left,no wheezing; diffuse rhonchi  RRR,No Gallops,Rubs or Murmurs, No Parasternal Heave +ve B.Sounds, Abd Soft, No tenderness, No organomegaly appriciated, No rebound - guarding or rigidity. No Cyanosis, Clubbing or edema, No new Rash or bruise     Data Review   Micro Results No results found for this or any previous visit (from the past 240 hour(s)).  Radiology Reports Ct Angio Chest Pe W/cm &/or Wo Cm  01/13/2015  CLINICAL DATA:  Hemoptysis and shortness of breath. EXAM: CT ANGIOGRAPHY CHEST WITH CONTRAST TECHNIQUE: Multidetector CT imaging of the chest was performed using the standard protocol during bolus administration of intravenous contrast. Multiplanar CT image reconstructions and MIPs were obtained to evaluate the vascular anatomy. CONTRAST:  75mL OMNIPAQUE IOHEXOL 350 MG/ML SOLN COMPARISON:  CT angiogram dated 01/27/2007 and CT scan dated 07/01/2007 FINDINGS: There is a large pulmonary embolus in the left main pulmonary artery extending into the left lower lobe. There is focal atelectasis in slight consolidation in the left lower lobe peripherally. Again noted is chronic dilatation of the distal esophagus with food material in that area. 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 Has the patient had previous esophageal surgery? This does appear unchanged since 2009. There is chronic slight cardiomegaly.  RV/ LV ratio is normal. The right lung is clear. Pulmonary vascularity is not increased. Very tiny right effusion. No osseous abnormality. No significant abnormality in the upper abdomen. Review of the MIP images confirms the above findings. IMPRESSION: 1. Large pulmonary embolism to the left lower lobe. Atelectasis in  the left lower lobe. 2. Chronic dilatation of the distal esophagus with what appears to be calcification in the wall of the esophagus, not significantly changed since 2009. Critical Value/emergent results were called by telephone at the time of interpretation on 01/13/2015 at 9:45 am to Casimiro Needle, RN , who verbally acknowledged these results. Electronically Signed   By: Francene Boyers M.D.   On: 01/13/2015 09:50   Dg Chest Port 1 View  01/23/2015  CLINICAL DATA:  Dyspnea EXAM: PORTABLE CHEST 1 VIEW COMPARISON:  01/22/2015 FINDINGS: Cardiomegaly again noted. Central mild vascular congestion  without convincing pulmonary edema. Slight improvement in aeration in left upper lobe. Again noted dense atelectasis or infiltrate in left lower lobe. Question small left pleural effusion. Stable right basilar atelectasis. IMPRESSION: Central mild vascular congestion without convincing pulmonary edema. Slight improvement in aeration in left upper lobe. Again noted dense atelectasis or infiltrate in left lower lobe. Question small left pleural effusion. Electronically Signed   By: Natasha MeadLiviu  Pop M.D.   On: 01/23/2015 09:10   Dg Chest Port 1 View  01/22/2015  CLINICAL DATA:  Hypoxia. EXAM: PORTABLE CHEST 1 VIEW COMPARISON:  01/20/2015. FINDINGS: Persistent opacification of the left hemithorax with volume loss consistent with left lung atelectasis, possibly from left bronchial occlusion. Mild right base subsegmental atelectasis. No pneumothorax. No acute osseus abnormality . IMPRESSION: 1. Persistent opacification left hemithorax with volume loss consistent left lung atelectasis. This is possibly secondary to left bronchial occlusion. No significant interim change. 2.  Mild right base subsegmental atelectasis. Electronically Signed   By: Maisie Fushomas  Register   On: 01/22/2015 08:08   Dg Chest Port 1 View  01/20/2015  CLINICAL DATA:  Patient with shortness of breath. EXAM: PORTABLE CHEST 1 VIEW COMPARISON:  Chest radiograph 01/18/2015.  FINDINGS: Monitoring leads overlie the patient. There is complete opacification of the left hemi thorax with leftward mediastinal shift. There is abrupt cut off of the left mainstem bronchus. There is small right pleural effusion with bandlike opacification within the right lower hemi thorax. No pneumothorax. IMPRESSION: Complete opacification of left hemi thorax with leftward mediastinal shift and abrupt cut off of the left mainstem bronchus. Overall findings are worrisome for atelectatic change involving the left lung, potentially secondary to endobronchial lesion or mucous plugging within left mainstem bronchus. Superimposed infectious process within left lung is an additional consideration. Consider direct visualization of the bronchus as clinically indicated. Scattered opacities within the right lower lung likely represent atelectasis. These results will be called to the ordering clinician or representative by the Radiologist Assistant, and communication documented in the PACS or zVision Dashboard. Electronically Signed   By: Annia Beltrew  Davis M.D.   On: 01/20/2015 09:11   Dg Chest Port 1 View  01/18/2015  CLINICAL DATA:  Acute worsening of shortness of breath this afternoon. Followup left pleural effusion and left lower lobe atelectasis versus pneumonia. Acute left lower lobe pulmonary embolus 01/13/2015. EXAM: PORTABLE CHEST 1 VIEW COMPARISON:  01/17/2015 and earlier, including CTA chest 01/13/2015. FINDINGS: Interval near complete opacification of the left hemithorax since yesterday, with only minimal aerated lung the left apex. Cardiac silhouette enlarged, though obscured by the opacified left hemithorax. Increased pulmonary vascular congestion without overt edema. Stable mild atelectasis at the right lung base. No visible right pleural effusion. IMPRESSION: 1. Near complete opacification of the left hemithorax with consolidation now present throughout most of the left lung. As there is no significant  mediastinal shift, this is likely a combination of worsening atelectasis and worsening pneumonia. 2. Stable mild right basilar atelectasis. 3. Worsening pulmonary vascular congestion without overt edema. These results will be called to the ordering clinician or representative by the Radiologist Assistant, and communication documented in the PACS or zVision Dashboard. Electronically Signed   By: Hulan Saashomas  Lawrence M.D.   On: 01/18/2015 17:32   Dg Chest Port 1 View  01/17/2015  CLINICAL DATA:  Pneumonia. EXAM: PORTABLE CHEST 1 VIEW COMPARISON:  January 12, 2015. FINDINGS: Stable cardiomegaly is noted. No pneumothorax is noted. Central pulmonary vascular congestion is again noted. Mild right basilar opacity is noted concerning  for edema or subsegmental atelectasis. Stable moderate left pleural effusion is noted with underlying atelectasis, edema or infiltrate. Bony thorax is unremarkable. IMPRESSION: Stable cardiomegaly and central pulmonary vascular congestion. Stable moderate left pleural effusion with underlying atelectasis, infiltrate or edema. Electronically Signed   By: Lupita Raider, M.D.   On: 01/17/2015 12:55   Dg Chest Portable 1 View  12/22/2014  CLINICAL DATA:  75 year old male with shortness of breath and cough EXAM: PORTABLE CHEST 1 VIEW COMPARISON:  Chest radiograph dated 06/01/2012 FINDINGS: No significant change in the previously seen left mid and lower lung field opacity. There is silhouetting of the left cardiac border and left hemidiaphragm. Multiple findings may be related to prominent pericardial fat and hiatal hernia, a pleural effusion or pneumonia is not excluded. Right lung base subsegmental atelectatic changes noted. There is no pneumothorax. The cardiac borders are silhouetted. The osseous structures appear unremarkable. IMPRESSION: No significant change in left mid to lower lung field opacity. Electronically Signed   By: Elgie Collard M.D.   On: 12/23/2014 18:58      CBC  Recent Labs Lab 01/18/15 0532 01/19/15 0500 01/20/15 0434 01/21/15 0809 01/22/15 0840 01/23/15 0335 01/24/15 0447  WBC 18.2* PATIENT DISCHARGED OR EXPIRED 19.4* 17.2* 17.7* 17.7* 20.9*  HGB 11.8* PATIENT DISCHARGED OR EXPIRED 9.3* 9.2* 9.6* 8.7* 8.9*  HCT 37.1* PATIENT DISCHARGED OR EXPIRED 30.3* 29.0* 29.4* 28.5* 28.8*  PLT 262 PATIENT DISCHARGED OR EXPIRED 269 265 PLATELET CLUMPS NOTED ON SMEAR, COUNT APPEARS ADEQUATE 265 275  MCV 96.6 PATIENT DISCHARGED OR EXPIRED 95.3 95.1 94.2 96.3 95.0  MCH 30.7 PATIENT DISCHARGED OR EXPIRED 29.2 30.2 30.8 29.4 29.4  MCHC 31.8 PATIENT DISCHARGED OR EXPIRED 30.7 31.7 32.7 30.5 30.9  RDW 15.4 PATIENT DISCHARGED OR EXPIRED 15.2 15.6* 15.6* 15.5 15.3  LYMPHSABS 3.7 PENDING  --   --   --   --   --   MONOABS 1.4* PENDING  --   --   --   --   --   EOSABS 0.4 PENDING  --   --   --   --   --   BASOSABS 0.1 PENDING  --   --   --   --   --     Chemistries   Recent Labs Lab 01/18/15 0532 01/21/15 0809 01/22/15 0840 01/23/15 0335 01/24/15 0447  NA 137 139 139 138 135  K 4.2 3.9 4.0 3.7 4.5  CL 97* 94* 93* 91* 93*  CO2 34* 35* 31 34* 29  GLUCOSE 164* 143* 211* 137* 133*  BUN 32* 61* 54* 50* 48*  CREATININE 1.61* 1.99* 2.07* 1.98* 2.10*  CALCIUM 8.6* 8.2* 8.3* 7.9* 8.2*  AST 16  --   --   --   --   ALT 16*  --   --   --   --   ALKPHOS 100  --   --   --   --   BILITOT 0.5  --   --   --   --    ------------------------------------------------------------------------------------------------------------------ estimated creatinine clearance is 36.5 mL/min (by C-G formula based on Cr of 2.1). ------------------------------------------------------------------------------------------------------------------ No results for input(s): HGBA1C in the last 72 hours. ------------------------------------------------------------------------------------------------------------------ No results for input(s): CHOL, HDL, LDLCALC, TRIG, CHOLHDL, LDLDIRECT  in the last 72 hours. ------------------------------------------------------------------------------------------------------------------ No results for input(s): TSH, T4TOTAL, T3FREE, THYROIDAB in the last 72 hours.  Invalid input(s): FREET3 ------------------------------------------------------------------------------------------------------------------ No results for input(s): VITAMINB12, FOLATE, FERRITIN, TIBC, IRON, RETICCTPCT in the last 72 hours.  Coagulation  profile  Recent Labs Lab 01/18/15 0532  INR 2.02*    No results for input(s): DDIMER in the last 72 hours.  Cardiac Enzymes  Recent Labs Lab 01/17/15 1827 01/18/15 0009  TROPONINI 0.09* 0.11*   ------------------------------------------------------------------------------------------------------------------ Invalid input(s): POCBNP     Time Spent in minutes   30 minutes   Vassie Loll M.D on 01/24/2015 at 6:13 PM  Between 7am to 7pm - Pager - [phone]  After 7pm go to [email or web address] - password [facility]  [facility]   Office  [phone]

## 2015-01-24 NOTE — Clinical Social Work Note (Signed)
Spoke with wife at bedside. Family has chosen home with hospice for patient. CSW will assist with transport home when needed.   Roddie McBryant Thor Nannini MSW, [place] ManorLCSW, Benjamin PerezLCASA, [phone]

## 2015-01-25 DIAGNOSIS — J690 Pneumonitis due to inhalation of food and vomit: Secondary | ICD-10-CM | POA: Insufficient documentation

## 2015-01-25 DIAGNOSIS — R0602 Shortness of breath: Secondary | ICD-10-CM | POA: Insufficient documentation

## 2015-01-25 DIAGNOSIS — J9601 Acute respiratory failure with hypoxia: Secondary | ICD-10-CM

## 2015-01-25 LAB — GLUCOSE, CAPILLARY
GLUCOSE-CAPILLARY: 138 mg/dL — AB (ref 65–99)
Glucose-Capillary: 136 mg/dL — ABNORMAL HIGH (ref 65–99)

## 2015-01-25 LAB — CBC
HCT: 29.7 % — ABNORMAL LOW (ref 39.0–52.0)
Hemoglobin: 9.1 g/dL — ABNORMAL LOW (ref 13.0–17.0)
MCH: 29.5 pg (ref 26.0–34.0)
MCHC: 30.6 g/dL (ref 30.0–36.0)
MCV: 96.4 fL (ref 78.0–100.0)
PLATELETS: 272 10*3/uL (ref 150–400)
RBC: 3.08 MIL/uL — AB (ref 4.22–5.81)
RDW: 15.4 % (ref 11.5–15.5)
WBC: 22.9 10*3/uL — AB (ref 4.0–10.5)

## 2015-01-25 LAB — HEPARIN LEVEL (UNFRACTIONATED): HEPARIN UNFRACTIONATED: 0.73 [IU]/mL — AB (ref 0.30–0.70)

## 2015-01-25 MED ORDER — LORAZEPAM 2 MG/ML IJ SOLN
1.0000 mg | INTRAMUSCULAR | Status: DC
  Administered 2015-01-25: 1 mg via INTRAVENOUS
  Filled 2015-01-25: qty 1

## 2015-01-25 MED ORDER — SCOPOLAMINE 1 MG/3DAYS TD PT72
1.0000 | MEDICATED_PATCH | TRANSDERMAL | Status: DC
  Administered 2015-01-25: 1.5 mg via TRANSDERMAL
  Filled 2015-01-25: qty 1

## 2015-01-25 MED ORDER — MORPHINE SULFATE (PF) 2 MG/ML IV SOLN: 1.0000 mg | INTRAVENOUS | Status: DC | PRN

## 2015-01-25 MED ORDER — MORPHINE SULFATE (PF) 2 MG/ML IV SOLN
1.0000 mg | INTRAVENOUS | Status: DC | PRN
  Administered 2015-01-25 (×2): 0.25 mg via INTRAVENOUS
  Filled 2015-01-25: qty 1

## 2015-01-25 MED ORDER — MORPHINE BOLUS VIA INFUSION
1.0000 mg | INTRAVENOUS | Status: DC | PRN
  Filled 2015-01-25: qty 1

## 2015-01-25 MED ORDER — LORAZEPAM BOLUS VIA INFUSION
0.5000 mg | INTRAVENOUS | Status: DC | PRN
  Filled 2015-01-25: qty 1

## 2015-01-25 MED ORDER — MORPHINE SULFATE 25 MG/ML IV SOLN
2.0000 mg/h | INTRAVENOUS | Status: DC
  Administered 2015-01-25: 2 mg/h via INTRAVENOUS
  Filled 2015-01-25: qty 10

## 2015-02-14 NOTE — Care Management Note (Signed)
Case Management Note  Patient Details  Name: [name] [name] MRN: [id number] Date of Birth: 1940-02-03  Subjective/Objective:   Pt admitted for 75 year old male with acute left pulmonary embolus with lung infarction and now with mucus plugging with HCAP. Patient is clinically worsening with rising leukocytosis and increasing oxygen requirement. Palliative Care is following the patient-Pt placed on Morphine Gtt.   Action/Plan: Plan was for Hospice Residential Facility, however not able to transport at this time. Will continue to monitor.    Expected Discharge Date:  01/16/15               Expected Discharge Plan:  Hospice Medical Facility  In-House Referral:  NA  Discharge planning Services  CM Consult  Post Acute Care Choice:  NA Choice offered to:  NA  DME Arranged:  N/A DME Agency:  NA  HH Arranged:  NA [facility] [place]  Status of Service:  In process, will continue to follow  Medicare Important Message Given:  Yes Date Medicare IM Given:    Medicare IM give by:    Date Additional Medicare IM Given:    Additional Medicare Important Message give by:     If discussed at Long Length of Stay Meetings, dates discussed:    Additional Comments:  [name] [name], [name] Correnti Kaye, RN 03-17-2015, 3:42 PM

## 2015-02-14 NOTE — Progress Notes (Signed)
PT Cancellation Note  Patient Details Name: [name] GrateWilliam H [name] MRN: [id number] DOB: August 02, 1940   Cancelled Treatment:    Reason Eval/Treat Not Completed: Other (comment) (Per RN plan is for hospice care). PT is signing off.  Please place new PT order if it becomes appropriate.  Michail JewelsAshley Parr PT, DPT [phone] Pager: [phone] 2015/12/25, 1:18 PM

## 2015-02-14 NOTE — Clinical Social Work Note (Signed)
Clinical Social Worker continuing to follow patient and family for support and discharge planning needs.  CSW spoke with patient wife and son at bedside regarding potential for residential hospice per MD request.  [facility] [place]  [place], however [place] with a bed available.  Per MD, patient too medically unstable for transport at this time.  [facility] updated, but will keep patient information if patient stabilizes tomorrow for transfer.  CSW remains available for support and to facilitate patient discharge needs if medically stable.  [name] GoldsJesse Cynda [name], KentuckyLCSW [phone]

## 2015-02-14 NOTE — Progress Notes (Signed)
Patient Demographics  [name] [name], is a 75 y.o. male, DOB - July 02, 1940, EXB:[id number]  Admit date - 12/12/14   Admitting Physician Haydee Monicaachal A David, MD  Outpatient Primary MD for the patient is [name], [name], MD  LOS - 12   Chief Complaint  Patient presents with  . Shortness of Breath  . Coughing up blood        Admission HPI/Brief narrative: 75 year old male with PMHof  diabetes, GERD, OSA/OHS on nocturnal BiPAP, COPD , atrial fibrillation, chronic left pleural effusion, and hypertension [facility] with complaints of dyspnea and hemoptysis, CTA chest was significant for pulmonary embolism, transitioned to Eliiquis, developed hemoptysis, initially on Eliquis , transitioned to  heparin GTT,with worsening respiratory failure, with increased oxygen demand, requiring BiPAP, heparin GTT for couple days secondary to significant hemoptysis, currently back on heparin GTT.   Subjective:   [name] FuelWilliam [name] today with worsening breathing and more respiratory distress. Patient is afebrile and no complaining of any pain. Decision is for full comfort he will be transition to the use of morphine and as needed Ativan. Antibiotics, heparin and any other medication not intended to keep patient comfortable will be discontinued.  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 - Continue with BiPAP daily at bedtime and PRN for now. -Respiratory failure  worsened secondary atelectasis from mucous plugging and chronic pleural effusion, treated with pulmonary toilet, chest PT as per pulmonary recommendation. -now with ongoing concerns for aspiration.  -palliative on board -Plan is for full comfort care -prognosis very poor; hours to days -Using as needed Ativan and morphine for comfort care -Rapidly declining and unstable to be transfer to hospice care at this moment.  Pulmonary embolism with suspected lung infarction/hemoptysis - Initially on Eliquis , changed to heparin GTT given his hemoptysis, patient with significant amount of hemoptysis 01/19/2015 -heparin was held for 48 hours and resumed now by PCCM rec's -not a candidate for systemic or catheter lysis -lower extremity Doppler with no evidence of DVT, no role for IVC filter. -Patient has deteriorated and at this moment that physician is to provide full comfort care. Heparin treatment has not been discontinued. -Patient transitioned to full comfort care.  Left hemithorax opacification on chest x-ray - Patient with known chronic left pleural effusion has been seen by Dr. Craige CottaSood as outpatient. - Within evidence of worsening left hemithorax, this is due to atelectasis from mucus plugging/blood and chronic pleural effusion. -No role for thoracentesis per pulmonary  -Transitioned to  full comfort care.  HCAP/Aspiration - Patient initially  on levofloxacin, started on vancomycin and aztreonam 01/20/2015 by pulmonary for concern HCAP, especially in the sitting of worsening leukocytosis and more difficulty breathing -WBC's continue trending up and patient respiratory process continue  worsening -most likely due to ongoing aspiration process. -Follow recommendations by [facility] M and after final decisions from goals of care meeting with bilateral care, patient transitioned to full comfort and the plan is not to pursue it any further antibiotic therapy. -We'll initiate as needed Ativan and morphine to help with air hunger and end of life agitation.  Chronic atrial fibrillation -CHA2DS2-VASc greater than 4 -heart rate controlled on diltiazem; but medication discontinued due to dysphagia -His now transitioned to full comfort care  Dysphagia - Patient with known esophageal motility disorder, and large epiphrenic diverticulum, this could be secondary to reflux esophagitis versus full filled diverticulum - seen by GI dr. Kelvin Cellar at [facility]. - unstable for EGD or any further invasive treatment currently - given ongoing aspiration and difficulty handling his own secretions, switched to NPO -After following GOC meeting patient will be transitioned to full comfort care.  Elevated troponin -secondary to PE -no CP -Plan is for full comfort care. Will discontinue telemetry  Leukocytosis -Stress related secondary to PE and pulmonary infarction, also with concerns for aspiration -Seen that physician has been made to transition to full comfort care; will discontinue antibiotic therapy  Diabetes mellitus -Patient has now been transitioned to full comfort care -We'll discontinue sliding scale, Lantus and further CBGs checks  Oral thrush -Resolved  -Treated with Diflucan, continue with Magic mouthwash  Chronic kidney disease stage III  - Appears to be at baseline   Code Status: DNR/DNI  Family Communication: wife and daughter at bedside  Disposition Plan: Palliative care discussing GOC with patient; base on my conversation they will preferred residential hospice. Patient is unstable currently and rapidly declining, can potentially passed away in the hospital.   Procedures   See below for x-ray reports   Consults   Gastroenterology- Dr. Karilyn Cota at [facility] Pulmonary service : Dr. Jamison [facility]   Medications  Scheduled Meds: . antiseptic oral rinse  7 mL Mouth Rinse q12n4p  . chlorhexidine  15 mL Mouth Rinse BID  . furosemide  40 mg Intravenous Q12H  . lidocaine  5 mL Mouth/Throat TID   And  . magic mouthwash  5 mL Oral TID  . LORazepam  1 mg Intravenous Q4H  . scopolamine  1 patch Transdermal Q72H   Continuous Infusions: . morphine 2 mg/hr (2015/02/01 1604)   PRN Meds:.acetaminophen, magic mouthwash, morphine  DVT Prophylaxis  Heparin GTT discontinued  Lab Results  Component Value Date   PLT 272 2015/02/01    Antibiotics   Anti-infectives    Start     Dose/Rate Route Frequency Ordered Stop   01/24/15 0600  vancomycin (VANCOCIN) 1,500 mg in sodium chloride 0.9 % 500 mL IVPB  Status:  Discontinued     1,500 mg 250 mL/hr over 120 Minutes Intravenous Every 48 hours 01/23/15 1229 February 01, 2015 1202   01/22/15 1400  levofloxacin (LEVAQUIN) IVPB 750 mg  Status:  Discontinued     750 mg 100 mL/hr over 90 Minutes Intravenous Every 48 hours 01/21/15 1401 02-01-15 1202   01/21/15 1200  vancomycin (VANCOCIN) 1,250 mg in sodium chloride 0.9 % 250 mL IVPB  Status:  Discontinued     1,250 mg 166.7 mL/hr over 90 Minutes Intravenous Every 24 hours 01/20/15 1112 01/23/15  1229   01/20/15 1115  vancomycin (VANCOCIN) 2,500 mg in sodium chloride 0.9 % 500 mL IVPB     2,500 mg 250 mL/hr over 120 Minutes Intravenous  Once 01/20/15 1112 01/20/15 1411   01/20/15 1115  aztreonam (AZACTAM) 1 g in dextrose 5 % 50 mL IVPB  Status:  Discontinued     1 g 100 mL/hr over 30 Minutes Intravenous 3 times per day 01/20/15 1112 01/22/2015 1202   01/17/15 0915  levofloxacin (LEVAQUIN) IVPB 500 mg  Status:  Discontinued     500 mg 100 mL/hr over 60 Minutes Intravenous Every 24 hours 01/17/15 0906 01/21/15 1401   01/15/15 1100  fluconazole (DIFLUCAN) tablet 100 mg      100 mg Oral Daily 01/15/15 1057 01/21/15 1155   01/13/15 0930  ciprofloxacin (CIPRO) IVPB 400 mg  Status:  Discontinued     400 mg 200 mL/hr over 60 Minutes Intravenous Every 12 hours 01/13/15 0841 01/17/15 0906         Objective:   Filed Vitals:   01/31/2015 1158 01/24/2015 1346 01/29/2015 1400 02/12/2015 1600  BP:      Pulse: 65  63   Temp:      TempSrc:      Resp: 20     Height:      Weight:      SpO2: 96% 85% 70% 69%    Wt Readings from Last 3 Encounters:  01/24/15 110.043 kg (242 lb 9.6 oz)  12/02/13 129.729 kg (286 lb)  10/21/12 133.358 kg (294 lb)     Intake/Output Summary (Last 24 hours) at 01/19/2015 1753 Last data filed at 01/18/2015 1000  Gross per 24 hour  Intake 837.42 ml  Output    301 ml  Net 536.42 ml     Physical Exam Somnolent but easily arouse, Oriented X 2; with increased work of breathing and requiring nonrebreather mask. No fever. Ill in appearance and with mild distress secondary to shortness of breath. [place].AT,PERRAL Supple Neck, No JVD, No cervical lymphadenopathy appriciated.  Symmetrical Chest wall movement, decreased air entry bilaterally, no wheezing; diffuse rhonchi. Mild use of accessory muscles RRR, No Gallops, Rubs or Murmurs, No Parasternal Heave +ve B.Sounds, Abd Soft, No tenderness, No organomegaly appriciated No Cyanosis, Clubbing or edema, No new Rash or bruise     Data Review   Micro Results No results found for this or any previous visit (from the past 240 hour(s)).  Radiology Reports Ct Angio Chest Pe W/cm &/or Wo Cm  01/13/2015  CLINICAL DATA:  Hemoptysis and shortness of breath. EXAM: CT ANGIOGRAPHY CHEST WITH CONTRAST TECHNIQUE: Multidetector CT imaging of the chest was performed using the standard protocol during bolus administration of intravenous contrast. Multiplanar CT image reconstructions and MIPs were obtained to evaluate the vascular anatomy. CONTRAST:  75mL OMNIPAQUE IOHEXOL 350 MG/ML SOLN COMPARISON:  CT angiogram dated  01/27/2007 and CT scan dated 07/01/2007 FINDINGS: There is a large pulmonary embolus in the left main pulmonary artery extending into the left lower lobe. There is focal atelectasis in slight consolidation in the left lower lobe peripherally. Again noted is chronic dilatation of the distal esophagus with food material in that area. 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 Has the patient had previous esophageal surgery? This does appear unchanged since 2009. There is chronic slight cardiomegaly.  RV/ LV ratio is normal. The right lung is clear. Pulmonary vascularity is not increased. Very tiny right effusion. No osseous abnormality. No significant abnormality in the upper  abdomen. Review of the MIP images confirms the above findings. IMPRESSION: 1. Large pulmonary embolism to the left lower lobe. Atelectasis in the left lower lobe. 2. Chronic dilatation of the distal esophagus with what appears to be calcification in the wall of the esophagus, not significantly changed since 2009. Critical Value/emergent results were called by telephone at the time of interpretation on 01/13/2015 at 9:45 am to Casimiro Needle, RN , who verbally acknowledged these results. Electronically Signed   By: Francene Boyers M.D.   On: 01/13/2015 09:50   Dg Chest Port 1 View  01/23/2015  CLINICAL DATA:  Dyspnea EXAM: PORTABLE CHEST 1 VIEW COMPARISON:  01/22/2015 FINDINGS: Cardiomegaly again noted. Central mild vascular congestion without convincing pulmonary edema. Slight improvement in aeration in left upper lobe. Again noted dense atelectasis or infiltrate in left lower lobe. Question small left pleural effusion. Stable right basilar atelectasis. IMPRESSION: Central mild vascular congestion without convincing pulmonary edema. Slight improvement in aeration in left upper lobe. Again noted dense atelectasis or infiltrate in left lower lobe. Question small left pleural effusion. Electronically Signed   By: Natasha Mead M.D.   On:  01/23/2015 09:10   Dg Chest Port 1 View  01/22/2015  CLINICAL DATA:  Hypoxia. EXAM: PORTABLE CHEST 1 VIEW COMPARISON:  01/20/2015. FINDINGS: Persistent opacification of the left hemithorax with volume loss consistent with left lung atelectasis, possibly from left bronchial occlusion. Mild right base subsegmental atelectasis. No pneumothorax. No acute osseus abnormality . IMPRESSION: 1. Persistent opacification left hemithorax with volume loss consistent left lung atelectasis. This is possibly secondary to left bronchial occlusion. No significant interim change. 2.  Mild right base subsegmental atelectasis. Electronically Signed   By: Maisie Fus  Register   On: 01/22/2015 08:08   Dg Chest Port 1 View  01/20/2015  CLINICAL DATA:  Patient with shortness of breath. EXAM: PORTABLE CHEST 1 VIEW COMPARISON:  Chest radiograph 01/18/2015. FINDINGS: Monitoring leads overlie the patient. There is complete opacification of the left hemi thorax with leftward mediastinal shift. There is abrupt cut off of the left mainstem bronchus. There is small right pleural effusion with bandlike opacification within the right lower hemi thorax. No pneumothorax. IMPRESSION: Complete opacification of left hemi thorax with leftward mediastinal shift and abrupt cut off of the left mainstem bronchus. Overall findings are worrisome for atelectatic change involving the left lung, potentially secondary to endobronchial lesion or mucous plugging within left mainstem bronchus. Superimposed infectious process within left lung is an additional consideration. Consider direct visualization of the bronchus as clinically indicated. Scattered opacities within the right lower lung likely represent atelectasis. These results will be called to the ordering clinician or representative by the Radiologist Assistant, and communication documented in the PACS or zVision Dashboard. Electronically Signed   By: Annia Belt M.D.   On: 01/20/2015 09:11   Dg Chest Port 1  View  01/18/2015  CLINICAL DATA:  Acute worsening of shortness of breath this afternoon. Followup left pleural effusion and left lower lobe atelectasis versus pneumonia. Acute left lower lobe pulmonary embolus 01/13/2015. EXAM: PORTABLE CHEST 1 VIEW COMPARISON:  01/17/2015 and earlier, including CTA chest 01/13/2015. FINDINGS: Interval near complete opacification of the left hemithorax since yesterday, with only minimal aerated lung the left apex. Cardiac silhouette enlarged, though obscured by the opacified left hemithorax. Increased pulmonary vascular congestion without overt edema. Stable mild atelectasis at the right lung base. No visible right pleural effusion. IMPRESSION: 1. Near complete opacification of the left hemithorax with consolidation now present throughout most of  the left lung. As there is no significant mediastinal shift, this is likely a combination of worsening atelectasis and worsening pneumonia. 2. Stable mild right basilar atelectasis. 3. Worsening pulmonary vascular congestion without overt edema. These results will be called to the ordering clinician or representative by the Radiologist Assistant, and communication documented in the PACS or zVision Dashboard. Electronically Signed   By: Hulan Saas M.D.   On: 01/18/2015 17:32   Dg Chest Port 1 View  01/17/2015  CLINICAL DATA:  Pneumonia. EXAM: PORTABLE CHEST 1 VIEW COMPARISON:  01/22/15. FINDINGS: Stable cardiomegaly is noted. No pneumothorax is noted. Central pulmonary vascular congestion is again noted. Mild right basilar opacity is noted concerning for edema or subsegmental atelectasis. Stable moderate left pleural effusion is noted with underlying atelectasis, edema or infiltrate. Bony thorax is unremarkable. IMPRESSION: Stable cardiomegaly and central pulmonary vascular congestion. Stable moderate left pleural effusion with underlying atelectasis, infiltrate or edema. Electronically Signed   By: Lupita Raider, M.D.    On: 01/17/2015 12:55   Dg Chest Portable 1 View  January 22, 2015  CLINICAL DATA:  75 year old male with shortness of breath and cough EXAM: PORTABLE CHEST 1 VIEW COMPARISON:  Chest radiograph dated 06/01/2012 FINDINGS: No significant change in the previously seen left mid and lower lung field opacity. There is silhouetting of the left cardiac border and left hemidiaphragm. Multiple findings may be related to prominent pericardial fat and hiatal hernia, a pleural effusion or pneumonia is not excluded. Right lung base subsegmental atelectatic changes noted. There is no pneumothorax. The cardiac borders are silhouetted. The osseous structures appear unremarkable. IMPRESSION: No significant change in left mid to lower lung field opacity. Electronically Signed   By: Elgie Collard M.D.   On: Jan 22, 2015 18:58     CBC  Recent Labs Lab 01/19/15 0500  01/21/15 0809 01/22/15 0840 01/23/15 0335 01/24/15 0447 01/21/2015 0409  WBC PATIENT DISCHARGED OR EXPIRED  < > 17.2* 17.7* 17.7* 20.9* 22.9*  HGB PATIENT DISCHARGED OR EXPIRED  < > 9.2* 9.6* 8.7* 8.9* 9.1*  HCT PATIENT DISCHARGED OR EXPIRED  < > 29.0* 29.4* 28.5* 28.8* 29.7*  PLT PATIENT DISCHARGED OR EXPIRED  < > 265 PLATELET CLUMPS NOTED ON SMEAR, COUNT APPEARS ADEQUATE 265 275 272  MCV PATIENT DISCHARGED OR EXPIRED  < > 95.1 94.2 96.3 95.0 96.4  MCH PATIENT DISCHARGED OR EXPIRED  < > 30.2 30.8 29.4 29.4 29.5  MCHC PATIENT DISCHARGED OR EXPIRED  < > 31.7 32.7 30.5 30.9 30.6  RDW PATIENT DISCHARGED OR EXPIRED  < > 15.6* 15.6* 15.5 15.3 15.4  LYMPHSABS PENDING  --   --   --   --   --   --   MONOABS PENDING  --   --   --   --   --   --   EOSABS PENDING  --   --   --   --   --   --   BASOSABS PENDING  --   --   --   --   --   --   < > = values in this interval not displayed.  Chemistries   Recent Labs Lab 01/21/15 0809 01/22/15 0840 01/23/15 0335 01/24/15 0447  NA 139 139 138 135  K 3.9 4.0 3.7 4.5  CL 94* 93* 91* 93*  CO2 35* 31 34* 29   GLUCOSE 143* 211* 137* 133*  BUN 61* 54* 50* 48*  CREATININE 1.99* 2.07* 1.98* 2.10*  CALCIUM 8.2* 8.3* 7.9*  8.2*   ------------------------------------------------------------------------------------------------------------------ estimated creatinine clearance is 36.5 mL/min (by C-G formula based on Cr of 2.1). ------------------------------------------------------------------------------------------------------------------ No results for input(s): HGBA1C in the last 72 hours. ------------------------------------------------------------------------------------------------------------------ No results for input(s): CHOL, HDL, LDLCALC, TRIG, CHOLHDL, LDLDIRECT in the last 72 hours. ------------------------------------------------------------------------------------------------------------------ No results for input(s): TSH, T4TOTAL, T3FREE, THYROIDAB in the last 72 hours.  Invalid input(s): FREET3 ------------------------------------------------------------------------------------------------------------------ No results for input(s): VITAMINB12, FOLATE, FERRITIN, TIBC, IRON, RETICCTPCT in the last 72 hours.  Coagulation profile No results for input(s): INR, PROTIME in the last 168 hours.  No results for input(s): DDIMER in the last 72 hours.  Cardiac Enzymes No results for input(s): CKMB, TROPONINI, MYOGLOBIN in the last 168 hours.  Invalid input(s): CK ------------------------------------------------------------------------------------------------------------------ Invalid input(s): POCBNP     Time Spent in minutes   30 minutes   Vassie Loll M.D on 02/21/2015 at 5:53 PM  Between 7am to 7pm - Pager - [phone]  After 7pm go to [email or web address] - password [facility]  [facility]   Office  [phone]

## 2015-02-14 NOTE — Progress Notes (Signed)
Daily Progress Note   Patient Name: [name] [name]       Date: 02/03/2015 DOB: 15-Jun-1940  Age: 75 y.o. MRN#: [id number] Attending Physician: Vassie Loll, MD Primary Care Physician: [name], MD Admit Date: 01-16-15  Reason for Consultation/Follow-up: Establishing goals of care, Non pain symptom management, Pain control and Psychosocial/spiritual support  Subjective: -f/u visit requested by floor nurse - patient has had a rapid decline and is not stable for discharge to hospice  -symptoms are not satisfactory managed at this time, discussed with family initaiting a morphine gtt   Natural trajectory and expectations at EOL were discussed.  Questions and concerns addressed.  Family encouraged to call with questions or concerns.  PMT will continue to support holistically.   Length of Stay: 12 days  Current Medications: Scheduled Meds:  . antiseptic oral rinse  7 mL Mouth Rinse q12n4p  . chlorhexidine  15 mL Mouth Rinse BID  . furosemide  40 mg Intravenous Q12H  . lidocaine  5 mL Mouth/Throat TID   And  . magic mouthwash  5 mL Oral TID  . LORazepam  1 mg Intravenous Q4H  . scopolamine  1 patch Transdermal Q72H    Continuous Infusions: . morphine      PRN Meds: acetaminophen, magic mouthwash, morphine  Physical Exam: Physical Exam              Vital Signs: BP 146/69 mmHg  Pulse 63  Temp(Src) 97.3 F (36.3 C) (Oral)  Resp 20  Ht 5\' 8"  (1.727 m)  Wt 110.043 kg (242 lb 9.6 oz)  BMI 36.90 kg/m2  SpO2 70% SpO2: SpO2: (!) 70 % O2 Device: O2 Device: NRB O2 Flow Rate: O2 Flow Rate (L/min): 5 L/min  Intake/output summary:  Intake/Output Summary (Last 24 hours) at 01/23/2015 1533 Last data filed at 01/22/2015 1000  Gross per 24 hour  Intake 837.42 ml  Output     301 ml  Net 536.42 ml   LBM: Last BM Date: 01/23/15 Baseline Weight: Weight: 109.8 kg (242 lb 1 oz) Most recent weight: Weight: 110.043 kg (242 lb 9.6 oz)       Palliative Assessment/Data: Flowsheet Rows        Most Recent Value   Intake Tab    Referral Department  Hospitalist   Unit at  Time of Referral  Cardiac/Telemetry Unit   Palliative Care Primary Diagnosis  Pulmonary   Date Notified  01/23/15   Palliative Care Type  New Palliative care   Reason for referral  Clarify Goals of Care   Date of Admission  12/15/2014   Date first seen by Palliative Care  01/24/15   # of days Palliative referral response time  1 Day(s)   # of days IP prior to Palliative referral  11   Clinical Assessment    Palliative Performance Scale Score  40%   Pain Max last 24 hours  Other (Comment)   Pain Min Last 24 hours  Other (Comment)   Dyspnea Max Last 24 Hours  Other (Comment)   Dyspnea Min Last 24 hours  Other (Comment)   Psychosocial & Spiritual Assessment    Social Work Plan of Care  Staff support   Palliative Care Outcomes    Patient/Family meeting held?  Yes   Who was at the meeting?  Mr. and Mrs. Goodnow   Palliative Care Outcomes  Clarified goals of care, Provided psychosocial or spiritual support, Counseled regarding hospice   Patient/Family wishes: Interventions discontinued/not started   Mechanical Ventilation, PEG   Palliative Care follow-up planned  Yes, Facility      Additional Data Reviewed: CBC    Component Value Date/Time   WBC 22.9* 12-Feb-2015 0409   RBC 3.08* Feb 12, 2015 0409   HGB 9.1* 12-Feb-2015 0409   HCT 29.7* 2015/02/12 0409   PLT 272 2015/02/12 0409   MCV 96.4 2015-02-12 0409   MCH 29.5 02-12-2015 0409   MCHC 30.6 2015/02/12 0409   RDW 15.4 02-12-2015 0409   LYMPHSABS PENDING 01/19/2015 0500   MONOABS PENDING 01/19/2015 0500   EOSABS PENDING 01/19/2015 0500   BASOSABS PENDING 01/19/2015 0500    CMP     Component Value Date/Time   NA 135 01/24/2015 0447    K 4.5 01/24/2015 0447   CL 93* 01/24/2015 0447   CO2 29 01/24/2015 0447   GLUCOSE 133* 01/24/2015 0447   BUN 48* 01/24/2015 0447   CREATININE 2.10* 01/24/2015 0447   CALCIUM 8.2* 01/24/2015 0447   PROT 6.8 01/18/2015 0532   ALBUMIN 2.7* 01/18/2015 0532   AST 16 01/18/2015 0532   ALT 16* 01/18/2015 0532   ALKPHOS 100 01/18/2015 0532   BILITOT 0.5 01/18/2015 0532   GFRNONAA 29* 01/24/2015 0447   GFRAA 34* 01/24/2015 0447       Problem List:  Patient Active Problem List   Diagnosis Date Noted  . OSA (obstructive sleep apnea)   . Palliative care encounter   . DNR (do not resuscitate) discussion   . Acute respiratory failure (HCC)   . Dysphagia   . HCAP (healthcare-associated pneumonia)   . PNA (pneumonia)   . Elevated troponin 01/13/2015  . Acute kidney injury (HCC) 01/13/2015  . Acute pulmonary embolism (HCC) 01/13/2015  . Pulmonary embolus (HCC) 01/13/2015  . Hemoptysis 01/05/2015  . Permanent atrial fibrillation (HCC) 09/13/2012  . Pulmonary hypertension, secondary (HCC) 09/13/2012  . 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 Dyspnea 06/01/2012  . Obesity hypoventilation syndrome (HCC) 06/14/2007  . COPD with chronic bronchitis (HCC) 06/14/2007  . Pleural effusion, left 06/14/2007  . RESPIRATORY FAILURE, CHRONIC 05/19/2007  . Obstructive sleep apnea 03/19/2007     Palliative Care Assessment & Plan    1.Code Status:  DNR    Code Status Orders        Start  Ordered   01/20/15 1053  Do not attempt resuscitation (DNR)   Continuous    Question Answer Comment  Maintain current active treatments No   Do not initiate new interventions No      01/20/15 1052    Code Status History    Date Active Date Inactive Code Status Order ID Comments User Context   01/13/2015  1:40 AM 01/20/2015 10:52 AM Full Code [id number]  Haydee Monicaachal A David, MD Inpatient       2. Goals of Care/Additional Recommendations:   Limitations on Scope of  Treatment: Full Comfort Care  Desire for further Chaplaincy support:no  3. Symptom Management:      1.Dyspnea/Pain: Morphine gtt with titration orders       2. Agitation: Ativan  4. Palliative Prophylaxis:   Bowel Regimen, Frequent Pain Assessment, Oral Care and Turn Reposition  5. Prognosis: Hours - Days  6. Discharge Planning:  Hospice facility   Care plan was discussed with Macario GoldsJesse Scinto LCSW  Thank you for allowing the Palliative Medicine Team to assist in the care of this patient.   Time In: 1330 Time Out: 1325 Total Time 25 min Prolonged Time Billed  no         Canary BrimMary W Larach, NP  11/26/2015, 3:33 PM  Please contact Palliative Medicine Team phone at [phone] for questions and concerns.

## 2015-02-14 NOTE — Progress Notes (Signed)
pt taking very apneic breaths, one every 30 seconds. chaplan ordered

## 2015-02-14 NOTE — Progress Notes (Signed)
Pt sats in 70s. 0.5 of morphine given to pt, albuterol treatment given by respiratory. MD aware. Monitor set to comfort care. Wife at bedside. Will continue to monitor.

## 2015-02-14 NOTE — Progress Notes (Signed)
Pt complaining of SOB, sating 88%, respiratory called, given albuterol treatment and placed on venturi mask

## 2015-02-14 NOTE — Discharge Summary (Signed)
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)   Elevated troponin   Acute kidney injury (HCC)   Acute pulmonary embolism (HCC)   Pulmonary embolus (HCC)   PNA (pneumonia)   HCAP (healthcare-associated pneumonia)   OSA (obstructive sleep apnea)   Palliative care encounter   DNR (do not resuscitate) discussion   Acute respiratory failure (HCC)   Dysphagia   SOB (shortness of breath)   Aspiration pneumonia (HCC)   History of present illness:  75 year old male with PMHof diabetes, GERD, OSA/OHS on nocturnal BiPAP, COPD , atrial fibrillation, chronic left pleural effusion, and hypertension [facility] with complaints of dyspnea and hemoptysis, CTA chest was significant for pulmonary embolism; started treatment on Eliiquis, developed hemoptysis; patient transitioned to heparin GTT and due to worsening respiratory failure, with increased oxygen demand and BIPAP requirement [facility] and [facility].  Hospital Course:  Acute on chronic respiratory failure - This is multifactorial, secondary to pulmonary embolism, aspiration and atelectasis with known baseline chronic OHS, OSA and COPD, as well chronic left pleural effusion. - Continue with BiPAP daily at bedtime and PRN for now. -Respiratory failure worsened secondary atelectasis from mucous plugging and chronic pleural effusion, treated with pulmonary toilet, chest PT as per pulmonary recommendation. -now with ongoing concerns for aspiration.   -palliative on board -Plan is for full comfort care -prognosis very poor; hours to days -as needed Ativan and morphine for comfort care initiated and titrated  -Rapidly declining and unstable to be transfer to hospice care at this moment. -patient continue declining and comfortably passed away around 6:00 pm  Pulmonary embolism with suspected lung infarction/hemoptysis - Initially on Eliquis , changed to heparin GTT given his hemoptysis, patient with significant amount of hemoptysis 01/19/2015 -heparin was held for 48 hours and resumed now by PCCM rec's -not a candidate for systemic or catheter lysis -lower extremity Doppler with no evidence of DVT, no role for IVC filter. -Patient has deteriorated and at this moment that physician is to provide full comfort care. Heparin treatment has not been discontinued. -Patient transitioned to full comfort care and inpatient death anticipated -rap[idly declined and passed away at 6:00 pm  Left hemithorax opacification on chest x-ray - Patient with known chronic left pleural effusion has been seen by Dr. Craige CottaSood as outpatient. - Within evidence of worsening left hemithorax, this is due to atelectasis from mucus plugging/blood and chronic pleural effusion. -No role for thoracentesis per pulmonary  -Transitioned to full comfort care.  HCAP/Aspiration - Patient initially on levofloxacin, started on vancomycin and aztreonam 01/20/2015 by pulmonary for concern HCAP, especially in the sitting of worsening leukocytosis and more difficulty breathing -WBC's continue trending up and patient respiratory process continue worsening -most likely due to ongoing aspiration process. -[facility] and after final decisions from goals of care meeting with bilateral care, patient transitioned to full comfort and the plan is not to pursue it any further antibiotic therapy. -as needed Ativan and morphine to help with air hunger and end of life agitation  initiated. -patient rapidly declined and passed away at 6:00 pm  Chronic atrial fibrillation -CHA2DS2-VASc greater than 4 -heart rate controlled on diltiazem; but medication discontinued due to dysphagia -He was transitioned to full comfort care  Dysphagia -Patient with known esophageal motility disorder, and large epiphrenic diverticulum, this could be Secondary to reflux esophagitis versus full filled diverticulum -seen by GI dr. Kelvin Cellar at [facility]. -unstable for EGD or any further invasive treatment currently -given ongoing aspiration and difficulty handling his own secretions, switched to NPO -After following GOC meeting patient was transitioned to full comfort care.  Elevated troponin -secondary to PE -no CP -Plan was decided for full comfort care.  -Telemetry discontinued   Leukocytosis -Stress related secondary to PE and pulmonary infarction, also with concerns for aspiration -Seen that physician has been made to transition to full comfort care; all antibiotic therapy discontinued  Diabetes mellitus -Patient has now been transitioned to full comfort care -discontinued sliding scale, Lantus and further CBGs checks  Oral thrush -Resolved  -Treated with Diflucan and Magic mouthwash  Chronic kidney disease stage III  -Cr remained at baseline   Time: 25 minutes  Signed:  Vassie Loll  [facility] 02/09/2015, 6:34 PM

## 2015-02-14 NOTE — Progress Notes (Signed)
Name: [name] [name] MRN: [id number] DOB: [birth year]    ADMISSION DATE:  01-31-2015 CONSULTATION DATE:  1/5  REFERRING MD :  Dr. Mahala Menghini  CHIEF COMPLAINT:  Effusion  SIGNIFICANT EVENTS  12/31  Admit to [facility] 01/05  Transfer [facility].  STUDIES:  Echo 03/08/07: EF 55%, mild diastolic dysfx, mild/mod LA/RA dilation, mild MR, mild TR, PAS 25 mmHg Spirometry 06/01/12: FEV1 1.52 (58%), FEV1% 70; mixed obstructive/restrictive defect BiPAP 09/28/13 to 10/27/13: used on 30 of 30 nights with average 14 hrs and 29 min. Average AHI is 1.7 with BiPAP 12/8 cm H2O. CTA Chest 12/31: large pulmonary embolism in the left main pulmonary artery extending into the left lower lobe. Atelectasis in the left lower lobe. Chronic dilation of distal esophagus. ECHO 1/4:  Moderate LVH, EF 65- 70%, no RWMA's, possible anterior pericardial effusion vs tissue density. "grossly normal RV, cannot assess RVSP" LE Doppler 1/6: negative Port CXR 1/10:  Persistent left lung opacification with minimal clearing.  ABX:  Levaquin 1/4 >>  Vancomycin 1/8>>> Aztreonam 1/8>>>  CULTURES:  Sputum>>Ordered  SUBJECTIVE:   Patient with significant clinical worsening this morning. Still having some mild nausea but no abdominal pain. Intermittent coughing with worsening dyspnea despite normal saturation on NRB. No improvement with BiPAP.  REVIEW OF SYSTEMS: No fever, chills, or sweats. No headache or vision changes.  VITAL SIGNS: Temp:  [97.3 F (36.3 C)-98.6 F (37 C)] 97.3 F (36.3 C) (01/12 0548) Pulse Rate:  [59-73] 71 (01/12 0752) Resp:  [18-32] 25 (01/12 0752) BP: (117-141)/(63-94) 132/63 mmHg (01/12 0548) SpO2:  [90 %-99 %] 90 % (01/12 0752)  PHYSICAL EXAMINATION: General:  Awake. Alert. Obviously uncomfortable in bed. Wife at bedside. Integument:  Warm & dry. No rash on exposed skin.  HEENT:  NRB in place. No scleral injection. Cardiovascular:  Regular rate. No appreciable JVD. Normal S1 & S2. Pulmonary:   Mildly increased work of breathing on NRB mask. No better when switched to BiPAP by me. Coarse breath sounds bilaterally. Abdomen: Soft. Normal bowel sounds. Protuberant.   Recent Labs Lab 01/22/15 0840 01/23/15 0335 01/24/15 0447  NA 139 138 135  K 4.0 3.7 4.5  CL 93* 91* 93*  CO2 31 34* 29  BUN 54* 50* 48*  CREATININE 2.07* 1.98* 2.10*  GLUCOSE 211* 137* 133*    Recent Labs Lab 01/23/15 0335 01/24/15 0447 01/29/2015 0409  HGB 8.7* 8.9* 9.1*  HCT 28.5* 28.8* 29.7*  WBC 17.7* 20.9* 22.9*  PLT 265 275 272   Dg Chest Port 1 View  01/23/2015  CLINICAL DATA:  Dyspnea EXAM: PORTABLE CHEST 1 VIEW COMPARISON:  01/22/2015 FINDINGS: Cardiomegaly again noted. Central mild vascular congestion without convincing pulmonary edema. Slight improvement in aeration in left upper lobe. Again noted dense atelectasis or infiltrate in left lower lobe. Question small left pleural effusion. Stable right basilar atelectasis. IMPRESSION: Central mild vascular congestion without convincing pulmonary edema. Slight improvement in aeration in left upper lobe. Again noted dense atelectasis or infiltrate in left lower lobe. Question small left pleural effusion. Electronically Signed   By: Natasha Mead M.D.   On: 01/23/2015 09:10    ASSESSMENT / PLAN:  75 year old male with acute left pulmonary embolus with lung infarction and now with mucus plugging with HCAP. Patient is clinically worsening with rising leukocytosis and increasing oxygen requirement. I did discuss with the patient and his wife at bedside that appears his clinical status is becoming terminal and he will likely pass. The patient wishes  to die at home and I'm concerned that the wife may be unable to physically provide the care that he will need. I had a lengthy discussion with the patient's wife and with his hospitalist regarding the need to determine if he can be discharged to home with hospice to about the remainder of this time.  1. Hemoptysis:  Resolved. Likely secondary to acute PE & lung infarction. Continuing nebs & close monitoring. Continue heparin gtt as below. Plan to discontinue continue heparin drip once patient transitions to full palliative care. 2. Acute Pulmonary Embolus with Lung Infarction: Tolerating heparin drip per pharmacy protocol. Plan to discontinue heparin drip once patient transitions to full palliative care. 3. HCAP: Continuing broad spectrum antibiotics. Plan to discontinue antibiotics once he transitions to full palliative care. 4. Chronic Left Pleural Effusion: No thoracentesis at this time. 5. OSA: Continue home BiPAP as needed for comfort. 6. Disposition: Discharge pending home support for wife with hospice.   Donna ChristenJennings E. Jamison NeighborNestor, M.D. [facility] Pager:  [phone] After 3pm or if no response, call [phone]  01/19/2015  8:05 AM

## 2015-02-14 NOTE — Progress Notes (Signed)
At 6pm asystole on monitor. Josh Rn and I listened, no heart or lung sounds. DR. Gwenlyn PerkingMadera notified. Hospice notified. [place] donare services contacted.

## 2015-02-14 NOTE — Progress Notes (Signed)
Called to patients room for SOB.  Patient was on 5lpm  and saturating 88%.  Administered patients nebulizer and while doing so his saturation came up to 94%.  Placed patient on Venturi mask 50% 12lpm.  Will hold on Cpt at this time.  Patient has productive cough with small amount of secretions.

## 2015-02-14 DEATH — deceased

## 2015-03-20 ENCOUNTER — Ambulatory Visit: Payer: PRIVATE HEALTH INSURANCE | Admitting: Pulmonary Disease
